# Patient Record
Sex: Male | Born: 1935 | Race: White | Hispanic: No | Marital: Married | Smoking: Never smoker
Health system: Southern US, Community
[De-identification: ages and names within clinical notes are randomized; demographics above are authoritative.]

## PROBLEM LIST (undated history)

## (undated) DIAGNOSIS — F0390 Unspecified dementia without behavioral disturbance: Secondary | ICD-10-CM

## (undated) HISTORY — PX: BACK SURGERY: SHX140

## (undated) HISTORY — PX: TOTAL HIP ARTHROPLASTY: SHX124

---

## 2005-11-25 ENCOUNTER — Emergency Department (HOSPITAL_COMMUNITY): Admission: EM | Admit: 2005-11-25 | Discharge: 2005-11-25 | Payer: Self-pay | Admitting: Family Medicine

## 2006-08-27 ENCOUNTER — Encounter: Admission: RE | Admit: 2006-08-27 | Discharge: 2006-08-27 | Payer: Self-pay | Admitting: Internal Medicine

## 2006-09-18 ENCOUNTER — Ambulatory Visit: Payer: Self-pay | Admitting: Gastroenterology

## 2006-10-02 ENCOUNTER — Ambulatory Visit: Payer: Self-pay | Admitting: Gastroenterology

## 2006-10-14 ENCOUNTER — Emergency Department (HOSPITAL_COMMUNITY): Admission: EM | Admit: 2006-10-14 | Discharge: 2006-10-14 | Payer: Self-pay | Admitting: Emergency Medicine

## 2011-07-31 ENCOUNTER — Encounter: Payer: Self-pay | Admitting: Gastroenterology

## 2011-12-23 ENCOUNTER — Emergency Department (HOSPITAL_COMMUNITY)
Admission: EM | Admit: 2011-12-23 | Discharge: 2011-12-23 | Discharge status: Against Medical Advice | Payer: Self-pay | Attending: Emergency Medicine | Admitting: Emergency Medicine

## 2011-12-23 ENCOUNTER — Encounter (HOSPITAL_COMMUNITY): Payer: Self-pay | Admitting: Physical Medicine and Rehabilitation

## 2011-12-23 ENCOUNTER — Emergency Department (INDEPENDENT_AMBULATORY_CARE_PROVIDER_SITE_OTHER): Payer: MEDICARE

## 2011-12-23 ENCOUNTER — Encounter (HOSPITAL_COMMUNITY): Payer: Self-pay

## 2011-12-23 ENCOUNTER — Emergency Department (INDEPENDENT_AMBULATORY_CARE_PROVIDER_SITE_OTHER)
Admission: EM | Admit: 2011-12-23 | Discharge: 2011-12-23 | Disposition: A | Discharge status: Other Acute Inpatient Hospital | Payer: MEDICARE | Source: Home / Self Care

## 2011-12-23 DIAGNOSIS — R109 Unspecified abdominal pain: Secondary | ICD-10-CM | POA: Insufficient documentation

## 2011-12-23 HISTORY — DX: Unspecified dementia, unspecified severity, without behavioral disturbance, psychotic disturbance, mood disturbance, and anxiety: F03.90

## 2011-12-23 LAB — POCT URINALYSIS DIP (DEVICE)
Ketones, ur: 40 mg/dL — AB
Protein, ur: 30 mg/dL — AB
Urobilinogen, UA: 1 mg/dL (ref 0.0–1.0)
pH: 6 (ref 5.0–8.0)

## 2011-12-23 NOTE — ED Notes (Addendum)
Patient left AMA; informed risks of leaving AMA.  Patient's family member states that they will go home and call to make an appointment with his primary care doctor in the morning.  Family member signed electronic AMA form.  Encouraged patient to stay; offered patient opportunity to speak with charge nurse and/or AD.  [facility] paperwork returned to patient, per request.

## 2011-12-23 NOTE — ED Notes (Signed)
Pt presents to department from [facility] for abdominal pain. Ongoing x4 days. Pt unable to answer all questions due to dementia. Family states belching, abdominal pain and constipation. X-ray at [facility] showed possible ileus. No nausea/vomiting. 5/10 pain at the time.

## 2011-12-23 NOTE — ED Notes (Signed)
Reported history of abdominal pain off and on for "quite a while"; known dementia , take aricept for dementia "when he wants to" family concerned about discomfort past few days, unsure of last BM ; have attempted to treat abdomnial pan at home w pepto bismol, and ducolax. Patient thinks he had diarrhea stool earlier today, but told EMT it has ben 3-4 days States he tried to call the office to make an appointment to see MD, but hung up on them when he went to voice mail

## 2011-12-23 NOTE — ED Provider Notes (Signed)
History     CSN: [id number]  Arrival date & time 12/23/11  1647   None     Chief Complaint  Patient presents with  . Abdominal Pain    (Consider location/radiation/quality/duration/timing/severity/associated sxs/prior treatment) Patient is a 78 y.o. male presenting with abdominal pain. The history is provided by the patient, the spouse and a relative.  Abdominal Pain The primary symptoms of the illness include abdominal pain.  Patient poor historian, history supplemented by wife and daughter.  Patient complains of midline abdominal pain that began 4 days ago.  No known aggravating or alleviating factors.  Denies nausea and vomiting, no fever noted, no change in urine, reports straining with defecation and decrease in amount of stool.  Stool noted to be hard and mix with 2 episodes of diarrhea in the last two days.  Wife reports poor appetite in the last week.      Past Medical History  Diagnosis Date  . Dementia     History reviewed. No pertinent past surgical history.  History reviewed. No pertinent family history.  History  Substance Use Topics  . Smoking status: Not on file  . Smokeless tobacco: Not on file  . Alcohol Use:       Review of Systems  Gastrointestinal: Positive for abdominal pain.  All other systems reviewed and are negative.    Allergies  Review of patient's allergies indicates not on file.  Home Medications   Current Outpatient Rx  Name Route Sig Dispense Refill  . DONEPEZIL HCL 10 MG PO TABS Oral Take 10 mg by mouth at bedtime as needed.      BP 154/73  Pulse 70  Temp 98.5 F (36.9 C) (Oral)  Resp 20  SpO2 96%  Physical Exam  Nursing note and vitals reviewed. Constitutional: He is oriented to person, place, and time. Vital signs are normal. He appears well-developed and well-nourished. He is active and cooperative.  HENT:  Head: Normocephalic.  Mouth/Throat: Uvula is midline, oropharynx is clear and moist and mucous membranes are  normal.  Eyes: Conjunctivae are normal. Pupils are equal, round, and reactive to light. No scleral icterus.  Neck: Trachea normal. Neck supple.  Cardiovascular: Normal rate, regular rhythm, normal heart sounds and normal pulses.   Pulmonary/Chest: Effort normal and breath sounds normal.  Abdominal: Soft. Normal appearance and normal aorta. He exhibits no distension, no ascites and no mass. Bowel sounds are increased. There is no hepatosplenomegaly. There is generalized tenderness. There is no CVA tenderness.  Neurological: He is alert and oriented to person, place, and time. No cranial nerve deficit or sensory deficit.  Skin: Skin is warm and dry.  Psychiatric: He has a normal mood and affect. His speech is normal and behavior is normal. Judgment and thought content normal. Cognition and memory are normal.    ED Course  Procedures (including critical care time)  Labs Reviewed  POCT URINALYSIS DIP (DEVICE) - Abnormal; Notable for the following:    Bilirubin Urine SMALL (*)     Ketones, ur 40 (*)     Protein, ur 30 (*)     All other components within normal limits   Dg Abd 1 View  12/23/2011  *RADIOLOGY REPORT*  Clinical Data: Chest pain  ABDOMEN - 1 VIEW  Comparison: None.  Findings: Diffuse distention of colon, small bowel, and the stomach is present.  No obvious free intraperitoneal gas.  Unremarkable soft tissues.  IMPRESSION: Diffuse gaseous distention as described.  Consider ileus.  Original Report Authenticated  By: [name], M.D.     1. Abdominal pain       MDM  Transfer to [facility] for further evaluation and treatment of abdominal pain.       Johnsie Kindred, NP 12/23/11 2030

## 2011-12-23 NOTE — ED Provider Notes (Signed)
Medical screening examination/treatment/procedure(s) were performed by non-physician practitioner and as supervising physician I was immediately available for consultation/collaboration.  Raynald Blend, MD 12/23/11 2049

## 2012-07-12 ENCOUNTER — Encounter: Payer: Self-pay | Admitting: Gastroenterology

## 2014-11-06 ENCOUNTER — Encounter (HOSPITAL_COMMUNITY): Payer: Self-pay

## 2014-11-06 ENCOUNTER — Emergency Department (HOSPITAL_COMMUNITY)
Admission: EM | Admit: 2014-11-06 | Discharge: 2014-11-07 | Disposition: A | Discharge status: Home / Self Care | Payer: Medicare Other | Attending: Emergency Medicine | Admitting: Emergency Medicine

## 2014-11-06 ENCOUNTER — Emergency Department (HOSPITAL_COMMUNITY): Payer: Medicare Other

## 2014-11-06 DIAGNOSIS — Z79899 Other long term (current) drug therapy: Secondary | ICD-10-CM | POA: Diagnosis not present

## 2014-11-06 DIAGNOSIS — F0390 Unspecified dementia without behavioral disturbance: Secondary | ICD-10-CM | POA: Diagnosis not present

## 2014-11-06 DIAGNOSIS — R110 Nausea: Secondary | ICD-10-CM | POA: Diagnosis not present

## 2014-11-06 DIAGNOSIS — E860 Dehydration: Secondary | ICD-10-CM | POA: Diagnosis present

## 2014-11-06 LAB — CBC WITH DIFFERENTIAL/PLATELET
Basophils Absolute: 0 10*3/uL (ref 0.0–0.1)
Basophils Relative: 0 % (ref 0–1)
EOS ABS: 0.1 10*3/uL (ref 0.0–0.7)
EOS PCT: 1 % (ref 0–5)
HEMATOCRIT: 32 % — AB (ref 39.0–52.0)
Hemoglobin: 10.4 g/dL — ABNORMAL LOW (ref 13.0–17.0)
LYMPHS ABS: 1.4 10*3/uL (ref 0.7–4.0)
LYMPHS PCT: 25 % (ref 12–46)
MCH: 32.3 pg (ref 26.0–34.0)
MCHC: 32.5 g/dL (ref 30.0–36.0)
MCV: 99.4 fL (ref 78.0–100.0)
Monocytes Absolute: 0.4 10*3/uL (ref 0.1–1.0)
Monocytes Relative: 7 % (ref 3–12)
NEUTROS ABS: 3.8 10*3/uL (ref 1.7–7.7)
Neutrophils Relative %: 67 % (ref 43–77)
PLATELETS: 254 10*3/uL (ref 150–400)
RBC: 3.22 MIL/uL — AB (ref 4.22–5.81)
RDW: 12.4 % (ref 11.5–15.5)
WBC: 5.7 10*3/uL (ref 4.0–10.5)

## 2014-11-06 LAB — COMPREHENSIVE METABOLIC PANEL
ALBUMIN: 3.9 g/dL (ref 3.5–5.0)
ALK PHOS: 47 U/L (ref 38–126)
ALT: 16 U/L — ABNORMAL LOW (ref 17–63)
ANION GAP: 10 (ref 5–15)
AST: 32 U/L (ref 15–41)
BUN: 23 mg/dL — AB (ref 6–20)
CALCIUM: 9.4 mg/dL (ref 8.9–10.3)
CO2: 24 mmol/L (ref 22–32)
Chloride: 104 mmol/L (ref 101–111)
Creatinine, Ser: 1.01 mg/dL (ref 0.61–1.24)
GFR calc Af Amer: >60 mL/min (ref >60)
GFR calc non Af Amer: >60 mL/min (ref >60)
GLUCOSE: 87 mg/dL (ref 65–99)
POTASSIUM: 3.9 mmol/L (ref 3.5–5.1)
SODIUM: 138 mmol/L (ref 135–145)
Total Bilirubin: 0.7 mg/dL (ref 0.3–1.2)
Total Protein: 6.8 g/dL (ref 6.5–8.1)

## 2014-11-06 LAB — TSH: TSH: 2.503 u[IU]/mL (ref 0.350–4.500)

## 2014-11-06 LAB — LIPASE, BLOOD: LIPASE: 43 U/L (ref 22–51)

## 2014-11-06 LAB — TROPONIN I

## 2014-11-06 MED ORDER — SODIUM CHLORIDE 0.9 % IV SOLN
Freq: Once | INTRAVENOUS | Status: AC
  Administered 2014-11-06: 22:00:00 via INTRAVENOUS

## 2014-11-06 MED ORDER — SODIUM CHLORIDE 0.9 % IV BOLUS (SEPSIS)
500.0000 mL | Freq: Once | INTRAVENOUS | Status: AC
  Administered 2014-11-06: 500 mL via INTRAVENOUS

## 2014-11-06 NOTE — ED Notes (Signed)
Daughter states that he has a hernia that he states hurts him sometime, he also states that he feels like things are too hot or too cold and he has trouble swallowing the food

## 2014-11-06 NOTE — ED Notes (Signed)
Patient in xray.  I will collect labs when he returned.

## 2014-11-06 NOTE — ED Notes (Signed)
Family is concerned because patient hasn't been eating or drinking for two days, pt has alzheimers, but family states that he was eating normally for him which isn't a lot for him anyways.

## 2014-11-06 NOTE — ED Notes (Signed)
Per pt's family - pt has refused to eat/drink for 2-3 days, pt had x1 episode of a small amount of diarrhea, pt's family states Saturday pt had complained about eating and stated his throat was hurting. Pt lying in bed, denies pain at present. Pt w/ hx of alzheimer and rt inguinal hernia.

## 2014-11-07 LAB — URINALYSIS, ROUTINE W REFLEX MICROSCOPIC
Bilirubin Urine: NEGATIVE
GLUCOSE, UA: NEGATIVE mg/dL
Ketones, ur: >80 mg/dL — AB
LEUKOCYTES UA: NEGATIVE
NITRITE: NEGATIVE
PH: 6 (ref 5.0–8.0)
Protein, ur: NEGATIVE mg/dL
SPECIFIC GRAVITY, URINE: 1.024 (ref 1.005–1.030)
Urobilinogen, UA: 1 mg/dL (ref 0.0–1.0)

## 2014-11-07 LAB — URINE MICROSCOPIC-ADD ON

## 2014-11-07 MED ORDER — OMEPRAZOLE 20 MG PO CPDR: 20.0000 mg | DELAYED_RELEASE_CAPSULE | Freq: Two times a day (BID) | ORAL | Status: DC

## 2014-11-07 NOTE — ED Provider Notes (Signed)
CSN: [id number]     Arrival date & time 11/06/14  1932 History   First MD Initiated Contact with Patient 11/06/14 2047     Chief Complaint  Patient presents with  . Dehydration     HPI  Family presents the patient for evaluation concerned that he may be dehydrated. He's had poor intake for the last 2-3 days and will complain at times that he hurts in his throat when he swallows. He takes Linzess. Family states he's had a little bit of diarrhea but "not much. No vomiting. Complains of discomfort in his throat when he swallows. He is urinating. He is normally incontinent and wears depends at night.  Past Medical History  Diagnosis Date  . Dementia    History reviewed. No pertinent past surgical history. History reviewed. No pertinent family history. History  Substance Use Topics  . Smoking status: Never Smoker   . Smokeless tobacco: Not on file  . Alcohol Use: No    Review of Systems  Unable to perform ROS: Dementia      Allergies  Review of patient's allergies indicates no known allergies.  Home Medications   Prior to Admission medications   Medication Sig Start Date End Date Taking? Authorizing Provider  LINZESS 145 MCG CAPS capsule Take 145 mcg by mouth daily. 11/02/14  Yes Historical Provider, MD  donepezil (ARICEPT) 10 MG tablet Take 10 mg by mouth at bedtime as needed.    Historical Provider, MD   BP 134/58 mmHg  Pulse 75  Temp(Src) 98.2 F (36.8 C) (Oral)  Resp 23  SpO2 100% Physical Exam  Constitutional: He appears well-developed and well-nourished. No distress.  HENT:  Head: Normocephalic.  Eyes: Conjunctivae are normal. Pupils are equal, round, and reactive to light. No scleral icterus.  Neck: Normal range of motion. Neck supple. No thyromegaly present.  Cardiovascular: Normal rate and regular rhythm.  Exam reveals no gallop and no friction rub.   No murmur heard. Pulmonary/Chest: Effort normal and breath sounds normal. No respiratory distress. He has no  wheezes. He has no rales.  Abdominal: Soft. Bowel sounds are normal. He exhibits no distension. There is no tenderness. There is no rebound.  Musculoskeletal: Normal range of motion.  Neurological: He is alert.  Skin: Skin is warm and dry. No rash noted.  Psychiatric: He has a normal mood and affect. His behavior is normal.    ED Course  Procedures (including critical care time) Labs Review Labs Reviewed  CBC WITH DIFFERENTIAL/PLATELET - Abnormal; Notable for the following:    RBC 3.22 (*)    Hemoglobin 10.4 (*)    HCT 32.0 (*)    All other components within normal limits  COMPREHENSIVE METABOLIC PANEL - Abnormal; Notable for the following:    BUN 23 (*)    ALT 16 (*)    All other components within normal limits  URINE CULTURE  LIPASE, BLOOD  TROPONIN I  TSH  URINALYSIS, ROUTINE W REFLEX MICROSCOPIC    Imaging Review Dg Abd Acute W/chest  11/06/2014   CLINICAL DATA:  Patient does not respond to questions, daughter states he has not eaten or had fluids for past three days, diarrhea for one week, seem to have abd pain  EXAM: DG ABDOMEN ACUTE W/ 1V CHEST  COMPARISON:  12/23/2011  FINDINGS: There is generalized increased bowel gas, but no bowel dilation is seen to suggest obstruction or significant adynamic ileus. There are no air-fluid levels on the decubitus view. There is no free air.  No significant soft tissue abnormality.  Lungs are hyperexpanded but clear. Heart, mediastinum and hila are unremarkable.  IMPRESSION: 1. No acute findings. No evidence of bowel obstruction, generalized adynamic ileus or free air. Mild generalized increased bowel gas. 2. No acute cardiopulmonary disease.   Electronically Signed   By: Amie Portlandavid  Ormond M.D.   On: 11/06/2014 22:43     EKG Interpretation None      MDM   Final diagnoses:  Nausea    Pt clinically not dehydrated. Moist lips and mouth. Not hypotensive or tachycardic. His labs are reassuring. Pharynx appears normal. See no thrush or  exudate. Her weight urine. I think he is appropriate for return home. We'll have him hold his Linzess, trial of H2 blocker and encouraging by mouth intake  .Marland Kitchen.atts  Rolland PorterMark Lina Hitch, MD 11/08/14 [phone]

## 2014-11-07 NOTE — Discharge Instructions (Signed)
Encourage food and fluids.  Hold his Linzess if he has more than one episode of diarrhea per day.  Start Prilosec prescription.  Check with his primary care physician if his appetite and intake do not improve.

## 2014-11-07 NOTE — ED Notes (Signed)
Pt ambulating independently w/ steady gait on d/c in no acute distress, A&Ox4. D/c instructions reviewed w/ pt and family - pt and family deny any further questions or concerns at present. Rx given x1  

## 2014-11-08 LAB — URINE CULTURE
COLONY COUNT: NO GROWTH
CULTURE: NO GROWTH

## 2015-03-21 ENCOUNTER — Emergency Department (HOSPITAL_COMMUNITY): Payer: Medicare Other

## 2015-03-21 ENCOUNTER — Inpatient Hospital Stay (HOSPITAL_COMMUNITY)
Admission: EM | Admit: 2015-03-21 | Discharge: 2015-03-26 | DRG: 469 | Disposition: A | Discharge status: Skilled Nursing Facility | Payer: Medicare Other | Attending: Internal Medicine | Admitting: Internal Medicine

## 2015-03-21 ENCOUNTER — Encounter (HOSPITAL_COMMUNITY): Payer: Self-pay

## 2015-03-21 DIAGNOSIS — F0390 Unspecified dementia without behavioral disturbance: Secondary | ICD-10-CM | POA: Diagnosis present

## 2015-03-21 DIAGNOSIS — R0902 Hypoxemia: Secondary | ICD-10-CM | POA: Diagnosis not present

## 2015-03-21 DIAGNOSIS — F0391 Unspecified dementia with behavioral disturbance: Secondary | ICD-10-CM | POA: Diagnosis present

## 2015-03-21 DIAGNOSIS — D539 Nutritional anemia, unspecified: Secondary | ICD-10-CM | POA: Diagnosis present

## 2015-03-21 DIAGNOSIS — M25551 Pain in right hip: Secondary | ICD-10-CM | POA: Diagnosis not present

## 2015-03-21 DIAGNOSIS — S72009A Fracture of unspecified part of neck of unspecified femur, initial encounter for closed fracture: Secondary | ICD-10-CM | POA: Diagnosis present

## 2015-03-21 DIAGNOSIS — D72829 Elevated white blood cell count, unspecified: Secondary | ICD-10-CM | POA: Diagnosis present

## 2015-03-21 DIAGNOSIS — R600 Localized edema: Secondary | ICD-10-CM | POA: Diagnosis present

## 2015-03-21 DIAGNOSIS — D649 Anemia, unspecified: Secondary | ICD-10-CM | POA: Diagnosis present

## 2015-03-21 DIAGNOSIS — S72011A Unspecified intracapsular fracture of right femur, initial encounter for closed fracture: Secondary | ICD-10-CM | POA: Diagnosis not present

## 2015-03-21 DIAGNOSIS — W06XXXA Fall from bed, initial encounter: Secondary | ICD-10-CM | POA: Diagnosis present

## 2015-03-21 DIAGNOSIS — T501X5A Adverse effect of loop [high-ceiling] diuretics, initial encounter: Secondary | ICD-10-CM | POA: Diagnosis present

## 2015-03-21 DIAGNOSIS — Y92013 Bedroom of single-family (private) house as the place of occurrence of the external cause: Secondary | ICD-10-CM

## 2015-03-21 DIAGNOSIS — S72001A Fracture of unspecified part of neck of right femur, initial encounter for closed fracture: Secondary | ICD-10-CM | POA: Diagnosis present

## 2015-03-21 DIAGNOSIS — Z09 Encounter for follow-up examination after completed treatment for conditions other than malignant neoplasm: Secondary | ICD-10-CM

## 2015-03-21 DIAGNOSIS — E860 Dehydration: Secondary | ICD-10-CM | POA: Diagnosis present

## 2015-03-21 DIAGNOSIS — Z79899 Other long term (current) drug therapy: Secondary | ICD-10-CM

## 2015-03-21 DIAGNOSIS — T148XXA Other injury of unspecified body region, initial encounter: Secondary | ICD-10-CM

## 2015-03-21 DIAGNOSIS — N179 Acute kidney failure, unspecified: Secondary | ICD-10-CM | POA: Diagnosis present

## 2015-03-21 DIAGNOSIS — K469 Unspecified abdominal hernia without obstruction or gangrene: Secondary | ICD-10-CM | POA: Diagnosis present

## 2015-03-21 DIAGNOSIS — F03918 Unspecified dementia, unspecified severity, with other behavioral disturbance: Secondary | ICD-10-CM | POA: Diagnosis present

## 2015-03-21 DIAGNOSIS — J181 Lobar pneumonia, unspecified organism: Secondary | ICD-10-CM | POA: Diagnosis not present

## 2015-03-21 DIAGNOSIS — W19XXXA Unspecified fall, initial encounter: Secondary | ICD-10-CM | POA: Diagnosis present

## 2015-03-21 LAB — URINALYSIS, ROUTINE W REFLEX MICROSCOPIC
BILIRUBIN URINE: NEGATIVE
Glucose, UA: NEGATIVE mg/dL
HGB URINE DIPSTICK: NEGATIVE
Ketones, ur: NEGATIVE mg/dL
Leukocytes, UA: NEGATIVE
NITRITE: NEGATIVE
PROTEIN: NEGATIVE mg/dL
SPECIFIC GRAVITY, URINE: 1.02 (ref 1.005–1.030)
UROBILINOGEN UA: 1 mg/dL (ref 0.0–1.0)
pH: 7 (ref 5.0–8.0)

## 2015-03-21 LAB — CBC WITH DIFFERENTIAL/PLATELET
BASOS ABS: 0 10*3/uL (ref 0.0–0.1)
BASOS PCT: 0 %
EOS ABS: 0.2 10*3/uL (ref 0.0–0.7)
Eosinophils Relative: 2 %
HEMATOCRIT: 32.1 % — AB (ref 39.0–52.0)
HEMOGLOBIN: 10 g/dL — AB (ref 13.0–17.0)
Lymphocytes Relative: 14 %
Lymphs Abs: 1.3 10*3/uL (ref 0.7–4.0)
MCH: 32.8 pg (ref 26.0–34.0)
MCHC: 31.2 g/dL (ref 30.0–36.0)
MCV: 105.2 fL — ABNORMAL HIGH (ref 78.0–100.0)
Monocytes Absolute: 0.5 10*3/uL (ref 0.1–1.0)
Monocytes Relative: 6 %
NEUTROS ABS: 7.6 10*3/uL (ref 1.7–7.7)
NEUTROS PCT: 78 %
Platelets: 232 10*3/uL (ref 150–400)
RBC: 3.05 MIL/uL — ABNORMAL LOW (ref 4.22–5.81)
RDW: 13.2 % (ref 11.5–15.5)
WBC: 9.7 10*3/uL (ref 4.0–10.5)

## 2015-03-21 LAB — BASIC METABOLIC PANEL
ANION GAP: 1 — AB (ref 5–15)
BUN: 29 mg/dL — ABNORMAL HIGH (ref 6–20)
CALCIUM: 8.6 mg/dL — AB (ref 8.9–10.3)
CHLORIDE: 106 mmol/L (ref 101–111)
CO2: 34 mmol/L — AB (ref 22–32)
CREATININE: 1.15 mg/dL (ref 0.61–1.24)
GFR calc non Af Amer: 55 mL/min — ABNORMAL LOW (ref >60)
Glucose, Bld: 99 mg/dL (ref 65–99)
Potassium: 3.7 mmol/L (ref 3.5–5.1)
SODIUM: 141 mmol/L (ref 135–145)

## 2015-03-21 LAB — TYPE AND SCREEN
ABO/RH(D): O POS
Antibody Screen: NEGATIVE

## 2015-03-21 LAB — PROTIME-INR
INR: 1.08 (ref 0.00–1.49)
PROTHROMBIN TIME: 14.2 s (ref 11.6–15.2)

## 2015-03-21 MED ORDER — FENTANYL CITRATE (PF) 100 MCG/2ML IJ SOLN
50.0000 ug | INTRAMUSCULAR | Status: DC | PRN
  Filled 2015-03-21: qty 2

## 2015-03-21 NOTE — ED Notes (Signed)
Pt had an unwitnessed fall from home, when EMS arrived pt was in the bed, he complains of right thigh pain when applying pressure to that leg, no other complaints

## 2015-03-21 NOTE — ED Provider Notes (Signed)
CSN: [id number]     Arrival date & time 03/21/15  2028 History   First MD Initiated Contact with Patient 03/21/15 2050     Chief Complaint  Patient presents with  . Fall     (Consider location/radiation/quality/duration/timing/severity/associated sxs/prior Treatment) Patient is a 81 y.o. male presenting with leg pain. The history is provided by a relative.  Leg Pain Location:  Hip Injury: yes   Mechanism of injury: fall   Fall:    Fall occurred: from bed.   Impact surface:  [employer] of impact:  Unable to specify   Entrapped after fall: no   Hip location:  R hip Pain details:    Quality:  Aching   Radiates to:  Does not radiate   Severity:  Moderate   Duration:  1 day   Timing:  Constant   Progression:  Unchanged Chronicity:  New Prior injury to area:  No Relieved by:  Nothing Worsened by:  Nothing tried Ineffective treatments:  None tried Associated symptoms: no fever     Past Medical History  Diagnosis Date  . Dementia    Past Surgical History  Procedure Laterality Date  . Back surgery     Family History  Problem Relation Age of Onset  . Dementia Other    Social History  Substance Use Topics  . Smoking status: Never Smoker   . Smokeless tobacco: None  . Alcohol Use: No    Review of Systems  Unable to perform ROS: Dementia  Constitutional: Negative for fever.      Allergies  Review of patient's allergies indicates no known allergies.  Home Medications   Prior to Admission medications   Medication Sig Start Date End Date Taking? Authorizing Provider  furosemide (LASIX) 20 MG tablet TAKE 1 TABLET BY MOUTH ONCE DAILY 02/25/15  Yes Historical Provider, MD  omeprazole (PRILOSEC) 20 MG capsule Take 1 capsule (20 mg total) by mouth 2 (two) times daily. Patient not taking: Reported on 03/21/2015 11/07/14   Rolland Porter, MD   BP 133/58 mmHg  Pulse 81  Temp(Src) 99 F (37.2 C) (Axillary)  Resp 16  Ht  (1.727 m)  Wt 131 lb 2.8 oz (59.5 kg)   BMI 19.95 kg/m2  SpO2 99% Physical Exam  Constitutional: He appears well-developed and well-nourished. No distress.  HENT:  Head: Normocephalic and atraumatic. Head is without contusion.  Eyes: Conjunctivae are normal.  Neck: Neck supple. No tracheal deviation present.  Cardiovascular: Normal rate, regular rhythm and normal heart sounds.   Pulmonary/Chest: Effort normal and breath sounds normal. No respiratory distress.  Abdominal: Soft. He exhibits no distension.  Musculoskeletal:  Pain with movement of hip, tenderness over right knee  Neurological: He is alert. He is disoriented.  Skin: Skin is warm and dry.  Psychiatric: He has a normal mood and affect.    ED Course  Procedures (including critical care time) Labs Review Labs Reviewed  CBC WITH DIFFERENTIAL/PLATELET - Abnormal; Notable for the following:    RBC 3.05 (*)    Hemoglobin 10.0 (*)    HCT 32.1 (*)    MCV 105.2 (*)    All other components within normal limits  BASIC METABOLIC PANEL - Abnormal; Notable for the following:    CO2 34 (*)    BUN 29 (*)    Calcium 8.6 (*)    GFR calc non Af Amer 55 (*)    Anion gap 1 (*)    All other components within normal limits  URINALYSIS, ROUTINE W REFLEX MICROSCOPIC (NOT AT [facility])  PROTIME-INR  I-STAT CG4 LACTIC ACID, ED  TYPE AND SCREEN  ABO/RH    Imaging Review Dg Chest 1 View  03/21/2015   CLINICAL DATA:  Unwitnessed fall.  EXAM: CHEST 1 VIEW  COMPARISON:  11/06/2014  FINDINGS: The heart size and mediastinal contours are within normal limits. Both lungs are clear. The visualized skeletal structures are unremarkable.  IMPRESSION: No active disease.   Electronically Signed   By: Ellery Plunk M.D.   On: 03/21/2015 21:49   Dg Pelvis 1-2 Views  03/21/2015   CLINICAL DATA:  Status post fall. Concern for pelvic injury. Right thigh pain. Initial encounter.  EXAM: PELVIS - 1-2 VIEW  COMPARISON:  Abdominal radiographs performed 11/06/2014 and 12/23/2011  FINDINGS: There is new  cortical irregularity at the right femoral neck, raising suspicion for a subcapital fracture of the right femoral neck. The right femoral head remains seated at the acetabulum.  Mild sclerotic change is noted at the sacroiliac joints. The left hip joint is unremarkable in appearance, with a small osseous density lateral to the joint space likely reflecting a small loose body. The visualized bowel gas pattern is grossly unremarkable.  IMPRESSION: New cortical irregularity of the right femoral neck, raising suspicion for a subcapital fracture of the right femoral neck.   Electronically Signed   By: Roanna Raider M.D.   On: 03/21/2015 21:49   Dg Knee Complete 4 Views Right  03/21/2015   CLINICAL DATA:  Unwitnessed fall at home now with right knee and thigh pain.  EXAM: RIGHT KNEE - COMPLETE 4+ VIEW  COMPARISON:  Right femur radiographs - earlier same day  FINDINGS: The lateral radiograph is degraded due to obliquity.  Osteopenia without definite displaced fracture. Mild degenerative change of the knee, most conspicuous within the medial compartment with joint space loss, subchondral sclerosis and osteophytosis. No evidence of chondrocalcinosis. No joint effusion or evidence of lipohemarthrosis. Adjacent vascular calcifications. No radiopaque foreign body.  IMPRESSION: Osteopenia without fracture or dislocation.   Electronically Signed   By: Simonne Come M.D.   On: 03/21/2015 21:49   Dg Femur, Min 2 Views Right  03/21/2015   CLINICAL DATA:  Fall  EXAM: RIGHT FEMUR 2 VIEWS  COMPARISON:  None.  FINDINGS: Subcapital femoral neck fracture with impaction is present. Osteopenia. Vascular calcifications are noted. No dislocation.  IMPRESSION: Acute subcapital right femoral neck fracture with impaction. Osteopenia.   Electronically Signed   By: Jolaine Click M.D.   On: 03/21/2015 21:49   I have personally reviewed and evaluated these images and lab results as part of my medical decision-making.   EKG  Interpretation None      MDM   Final diagnoses:  Hip fracture, right, closed, initial encounter [facility])   81 y.o. male presents with fall from bed and right leg pain. Has large, firm, reducible hernia and pain refractory on leg following reduction. Plain films concerning for fracture. Orthopedics consulted and recommended medical admission, will see Pt in the morning. Hospitalist was consulted for admission and will see the patient in the emergency department.     Lyndal Pulley, MD 03/22/15 0111

## 2015-03-21 NOTE — ED Notes (Signed)
Admitting Dr in for evaluation 

## 2015-03-21 NOTE — ED Notes (Signed)
Bed: ZO10 Expected date:  Expected time:  Means of arrival:  Comments: EMS/74M/unwitnessed fall/R thigh pain

## 2015-03-21 NOTE — ED Notes (Signed)
Unable to collect labs at this time patient going to xray 

## 2015-03-22 ENCOUNTER — Inpatient Hospital Stay (HOSPITAL_COMMUNITY): Payer: Medicare Other

## 2015-03-22 ENCOUNTER — Encounter (HOSPITAL_COMMUNITY): Payer: Self-pay | Admitting: Internal Medicine

## 2015-03-22 DIAGNOSIS — K469 Unspecified abdominal hernia without obstruction or gangrene: Secondary | ICD-10-CM | POA: Diagnosis present

## 2015-03-22 DIAGNOSIS — R600 Localized edema: Secondary | ICD-10-CM | POA: Diagnosis present

## 2015-03-22 DIAGNOSIS — S72001D Fracture of unspecified part of neck of right femur, subsequent encounter for closed fracture with routine healing: Secondary | ICD-10-CM | POA: Diagnosis not present

## 2015-03-22 DIAGNOSIS — R0902 Hypoxemia: Secondary | ICD-10-CM | POA: Diagnosis not present

## 2015-03-22 DIAGNOSIS — F03918 Unspecified dementia, unspecified severity, with other behavioral disturbance: Secondary | ICD-10-CM | POA: Diagnosis present

## 2015-03-22 DIAGNOSIS — Z79899 Other long term (current) drug therapy: Secondary | ICD-10-CM | POA: Diagnosis not present

## 2015-03-22 DIAGNOSIS — F0390 Unspecified dementia without behavioral disturbance: Secondary | ICD-10-CM | POA: Diagnosis not present

## 2015-03-22 DIAGNOSIS — F0391 Unspecified dementia with behavioral disturbance: Secondary | ICD-10-CM | POA: Diagnosis present

## 2015-03-22 DIAGNOSIS — J189 Pneumonia, unspecified organism: Secondary | ICD-10-CM | POA: Diagnosis not present

## 2015-03-22 DIAGNOSIS — S72001S Fracture of unspecified part of neck of right femur, sequela: Secondary | ICD-10-CM | POA: Diagnosis not present

## 2015-03-22 DIAGNOSIS — S72001A Fracture of unspecified part of neck of right femur, initial encounter for closed fracture: Secondary | ICD-10-CM | POA: Diagnosis not present

## 2015-03-22 DIAGNOSIS — D649 Anemia, unspecified: Secondary | ICD-10-CM | POA: Diagnosis not present

## 2015-03-22 DIAGNOSIS — S72009A Fracture of unspecified part of neck of unspecified femur, initial encounter for closed fracture: Secondary | ICD-10-CM | POA: Diagnosis present

## 2015-03-22 DIAGNOSIS — S72011A Unspecified intracapsular fracture of right femur, initial encounter for closed fracture: Secondary | ICD-10-CM | POA: Diagnosis present

## 2015-03-22 DIAGNOSIS — W19XXXS Unspecified fall, sequela: Secondary | ICD-10-CM | POA: Diagnosis not present

## 2015-03-22 DIAGNOSIS — T501X5A Adverse effect of loop [high-ceiling] diuretics, initial encounter: Secondary | ICD-10-CM | POA: Diagnosis present

## 2015-03-22 DIAGNOSIS — M25551 Pain in right hip: Secondary | ICD-10-CM | POA: Diagnosis present

## 2015-03-22 DIAGNOSIS — W06XXXA Fall from bed, initial encounter: Secondary | ICD-10-CM | POA: Diagnosis present

## 2015-03-22 DIAGNOSIS — Y92013 Bedroom of single-family (private) house as the place of occurrence of the external cause: Secondary | ICD-10-CM | POA: Diagnosis not present

## 2015-03-22 DIAGNOSIS — E860 Dehydration: Secondary | ICD-10-CM | POA: Diagnosis present

## 2015-03-22 DIAGNOSIS — N179 Acute kidney failure, unspecified: Secondary | ICD-10-CM | POA: Diagnosis present

## 2015-03-22 DIAGNOSIS — J181 Lobar pneumonia, unspecified organism: Secondary | ICD-10-CM | POA: Diagnosis not present

## 2015-03-22 DIAGNOSIS — D539 Nutritional anemia, unspecified: Secondary | ICD-10-CM | POA: Diagnosis present

## 2015-03-22 LAB — CBC WITH DIFFERENTIAL/PLATELET
BASOS PCT: 0 %
Basophils Absolute: 0 10*3/uL (ref 0.0–0.1)
EOS ABS: 0.1 10*3/uL (ref 0.0–0.7)
EOS PCT: 1 %
HCT: 30.8 % — ABNORMAL LOW (ref 39.0–52.0)
Hemoglobin: 9.8 g/dL — ABNORMAL LOW (ref 13.0–17.0)
LYMPHS ABS: 0.8 10*3/uL (ref 0.7–4.0)
Lymphocytes Relative: 7 %
MCH: 33.1 pg (ref 26.0–34.0)
MCHC: 31.8 g/dL (ref 30.0–36.0)
MCV: 104.1 fL — ABNORMAL HIGH (ref 78.0–100.0)
MONO ABS: 0.8 10*3/uL (ref 0.1–1.0)
MONOS PCT: 7 %
NEUTROS PCT: 85 %
Neutro Abs: 9.7 10*3/uL — ABNORMAL HIGH (ref 1.7–7.7)
Platelets: 199 10*3/uL (ref 150–400)
RBC: 2.96 MIL/uL — ABNORMAL LOW (ref 4.22–5.81)
RDW: 13.1 % (ref 11.5–15.5)
WBC: 11.4 10*3/uL — ABNORMAL HIGH (ref 4.0–10.5)

## 2015-03-22 LAB — COMPREHENSIVE METABOLIC PANEL
ALK PHOS: 45 U/L (ref 38–126)
ALT: 16 U/L — AB (ref 17–63)
AST: 27 U/L (ref 15–41)
Albumin: 3.3 g/dL — ABNORMAL LOW (ref 3.5–5.0)
Anion gap: 5 (ref 5–15)
BUN: 27 mg/dL — AB (ref 6–20)
CALCIUM: 8.6 mg/dL — AB (ref 8.9–10.3)
CHLORIDE: 105 mmol/L (ref 101–111)
CO2: 30 mmol/L (ref 22–32)
CREATININE: 1.03 mg/dL (ref 0.61–1.24)
GFR calc non Af Amer: >60 mL/min (ref >60)
GLUCOSE: 115 mg/dL — AB (ref 65–99)
Potassium: 4 mmol/L (ref 3.5–5.1)
SODIUM: 140 mmol/L (ref 135–145)
Total Bilirubin: 0.9 mg/dL (ref 0.3–1.2)
Total Protein: 5.8 g/dL — ABNORMAL LOW (ref 6.5–8.1)

## 2015-03-22 LAB — SURGICAL PCR SCREEN
MRSA, PCR: NEGATIVE
STAPHYLOCOCCUS AUREUS: NEGATIVE

## 2015-03-22 LAB — ABO/RH: ABO/RH(D): O POS

## 2015-03-22 MED ORDER — LORAZEPAM 2 MG/ML IJ SOLN
0.5000 mg | Freq: Once | INTRAMUSCULAR | Status: DC
  Filled 2015-03-22: qty 1

## 2015-03-22 MED ORDER — INFLUENZA VAC SPLIT QUAD 0.5 ML IM SUSY: 0.5000 mL | PREFILLED_SYRINGE | INTRAMUSCULAR | Status: DC | PRN

## 2015-03-22 MED ORDER — FUROSEMIDE 20 MG PO TABS
20.0000 mg | ORAL_TABLET | Freq: Every day | ORAL | Status: DC
  Administered 2015-03-23 – 2015-03-25 (×3): 20 mg via ORAL
  Filled 2015-03-22 (×3): qty 1

## 2015-03-22 MED ORDER — METHOCARBAMOL 1000 MG/10ML IJ SOLN
500.0000 mg | Freq: Three times a day (TID) | INTRAVENOUS | Status: DC
  Administered 2015-03-23 (×2): 500 mg via INTRAVENOUS
  Filled 2015-03-22 (×5): qty 5

## 2015-03-22 MED ORDER — LORAZEPAM 2 MG/ML IJ SOLN
0.5000 mg | Freq: Once | INTRAMUSCULAR | Status: AC
  Administered 2015-03-22: 0.5 mg via INTRAVENOUS

## 2015-03-22 MED ORDER — DEXTROSE 5 % IV SOLN
1.0000 g | INTRAVENOUS | Status: DC
  Administered 2015-03-22 – 2015-03-25 (×4): 1 g via INTRAVENOUS
  Filled 2015-03-22 (×6): qty 10

## 2015-03-22 MED ORDER — HYDROCODONE-ACETAMINOPHEN 5-325 MG PO TABS: 1.0000 | ORAL_TABLET | Freq: Four times a day (QID) | ORAL | Status: DC | PRN

## 2015-03-22 MED ORDER — TRANEXAMIC ACID 1000 MG/10ML IV SOLN
1000.0000 mg | INTRAVENOUS | Status: AC
  Filled 2015-03-22: qty 10

## 2015-03-22 MED ORDER — CEFAZOLIN SODIUM-DEXTROSE 2-3 GM-% IV SOLR
2.0000 g | INTRAVENOUS | Status: AC
  Administered 2015-03-23: 2 g via INTRAVENOUS
  Filled 2015-03-22: qty 50

## 2015-03-22 MED ORDER — INFLUENZA VAC SPLIT QUAD 0.5 ML IM SUSY
0.5000 mL | PREFILLED_SYRINGE | INTRAMUSCULAR | Status: DC | PRN
  Filled 2015-03-22: qty 0.5

## 2015-03-22 MED ORDER — DEXTROSE 5 % IV SOLN
500.0000 mg | INTRAVENOUS | Status: DC
  Administered 2015-03-22 – 2015-03-23 (×2): 500 mg via INTRAVENOUS
  Filled 2015-03-22 (×2): qty 500

## 2015-03-22 MED ORDER — SODIUM CHLORIDE 0.9 % IV SOLN
INTRAVENOUS | Status: AC
  Administered 2015-03-22 – 2015-03-23 (×2): via INTRAVENOUS

## 2015-03-22 MED ORDER — MORPHINE SULFATE (PF) 2 MG/ML IV SOLN
0.5000 mg | INTRAVENOUS | Status: DC | PRN
  Administered 2015-03-22 – 2015-03-24 (×8): 0.5 mg via INTRAVENOUS
  Filled 2015-03-22 (×9): qty 1

## 2015-03-22 NOTE — H&P (Addendum)
[facility] History and Physical  Wayne Schroeder:[id number] DOB: 01-28-1925 DOA: 03/21/2015  Referring physician: Dr. Clydene Pugh. PCP: Martha Clan, MD  Specialists: None.  Chief Complaint: Fall.  HPI: Wayne Schroeder is a 81 y.o. male with history of dementia and lower extremity edema on Lasix was brought to the ER after patient had a fall at his house. Patient's fall was witnessed by patient's wife. As per patient's daughter who provided history patient did not hit his head or lose consciousness and has not complained of any chest pain or shortness of breath. In the ER x-rays reveal a right hip fracture and on-call orthopedic surgeon Dr. Victorino Dike was consulted and patient admitted for further management. On exam patient is mildly sedated. Patient also had abdominal hernia which was reduced by the ER physician. As per patient's daughter patient is used to ambulatory without help and is able to dress himself up after prompting.  Review of Systems: As presented in the history of presenting illness, rest negative.  Past Medical History  Diagnosis Date  . Dementia    Past Surgical History  Procedure Laterality Date  . Back surgery     Social History:  reports that he has never smoked. He does not have any smokeless tobacco history on file. He reports that he does not drink alcohol or use illicit drugs. Where does patient live home. Can patient participate in ADLs? No.  No Known Allergies  Family History:  Family History  Problem Relation Age of Onset  . Dementia Other       Prior to Admission medications   Medication Sig Start Date End Date Taking? Authorizing Provider  furosemide (LASIX) 20 MG tablet TAKE 1 TABLET BY MOUTH ONCE DAILY 02/25/15  Yes Historical Provider, MD  omeprazole (PRILOSEC) 20 MG capsule Take 1 capsule (20 mg total) by mouth 2 (two) times daily. Patient not taking: Reported on 03/21/2015 11/07/14   Rolland Porter, MD    Physical Exam: Filed Vitals:    03/21/15 2039 03/21/15 2347 03/22/15 0000  BP: 137/77 133/58   Pulse: 91 81   Temp: 97.5 F (36.4 C) 99 F (37.2 C)   TempSrc: Oral Axillary   Resp: 18 16   Height:    (1.727 m)  Weight:   59.5 kg (131 lb 2.8 oz)  SpO2: 97% 99%      General:  Moderately built and nourished.  Eyes: Anicteric no pallor.  ENT: No discharge from the ears eyes nose or mouth.  Neck: No mass felt.  Cardiovascular: S1-S2 heard.  Respiratory: No rhonchi or crepitations.  Abdomen: Abdominal hernia reduced. Soft nontender.  Skin: No rash.  Musculoskeletal: Pain on moving her right hip.  Psychiatric: Patient is mildly sedated.  Neurologic: Mildly sedated. Responds to his name.  Labs on Admission:  Basic Metabolic Panel:  Recent Labs Lab 03/21/15 2207  NA 141  K 3.7  CL 106  CO2 34*  GLUCOSE 99  BUN 29*  CREATININE 1.15  CALCIUM 8.6*   Liver Function Tests: No results for input(s): AST, ALT, ALKPHOS, BILITOT, PROT, ALBUMIN in the last 168 hours. No results for input(s): LIPASE, AMYLASE in the last 168 hours. No results for input(s): AMMONIA in the last 168 hours. CBC:  Recent Labs Lab 03/21/15 2207  WBC 9.7  NEUTROABS 7.6  HGB 10.0*  HCT 32.1*  MCV 105.2*  PLT 232   Cardiac Enzymes: No results for input(s): CKTOTAL, CKMB, CKMBINDEX, TROPONINI in the last 168 hours.  BNP (  last 3 results) No results for input(s): BNP in the last 8760 hours.  ProBNP (last 3 results) No results for input(s): PROBNP in the last 8760 hours.  CBG: No results for input(s): GLUCAP in the last 168 hours.  Radiological Exams on Admission: Dg Chest 1 View  03/21/2015   CLINICAL DATA:  Unwitnessed fall.  EXAM: CHEST 1 VIEW  COMPARISON:  11/06/2014  FINDINGS: The heart size and mediastinal contours are within normal limits. Both lungs are clear. The visualized skeletal structures are unremarkable.  IMPRESSION: No active disease.   Electronically Signed   By: Ellery Plunk M.D.   On:  03/21/2015 21:49   Dg Pelvis 1-2 Views  03/21/2015   CLINICAL DATA:  Status post fall. Concern for pelvic injury. Right thigh pain. Initial encounter.  EXAM: PELVIS - 1-2 VIEW  COMPARISON:  Abdominal radiographs performed 11/06/2014 and 12/23/2011  FINDINGS: There is new cortical irregularity at the right femoral neck, raising suspicion for a subcapital fracture of the right femoral neck. The right femoral head remains seated at the acetabulum.  Mild sclerotic change is noted at the sacroiliac joints. The left hip joint is unremarkable in appearance, with a small osseous density lateral to the joint space likely reflecting a small loose body. The visualized bowel gas pattern is grossly unremarkable.  IMPRESSION: New cortical irregularity of the right femoral neck, raising suspicion for a subcapital fracture of the right femoral neck.   Electronically Signed   By: Roanna Raider M.D.   On: 03/21/2015 21:49   Dg Knee Complete 4 Views Right  03/21/2015   CLINICAL DATA:  Unwitnessed fall at home now with right knee and thigh pain.  EXAM: RIGHT KNEE - COMPLETE 4+ VIEW  COMPARISON:  Right femur radiographs - earlier same day  FINDINGS: The lateral radiograph is degraded due to obliquity.  Osteopenia without definite displaced fracture. Mild degenerative change of the knee, most conspicuous within the medial compartment with joint space loss, subchondral sclerosis and osteophytosis. No evidence of chondrocalcinosis. No joint effusion or evidence of lipohemarthrosis. Adjacent vascular calcifications. No radiopaque foreign body.  IMPRESSION: Osteopenia without fracture or dislocation.   Electronically Signed   By: Simonne Come M.D.   On: 03/21/2015 21:49   Dg Femur, Min 2 Views Right  03/21/2015   CLINICAL DATA:  Fall  EXAM: RIGHT FEMUR 2 VIEWS  COMPARISON:  None.  FINDINGS: Subcapital femoral neck fracture with impaction is present. Osteopenia. Vascular calcifications are noted. No dislocation.  IMPRESSION: Acute  subcapital right femoral neck fracture with impaction. Osteopenia.   Electronically Signed   By: Jolaine Click M.D.   On: 03/21/2015 21:49    Assessment/Plan Active Problems:   Closed right hip fracture (HCC)   Dementia   Chronic anemia   Hip fracture (HCC)   Hip fracture, right (HCC)   1. Right hip fracture status post mechanical fall - I have discussed with Dr. Victorino Dike on call orthopedic surgeon. Plan is to have surgery later in the day for which patient be kept nothing by mouth from morning. Continue with pain relief medications. Hold Lasix for now. Patient given his age is high risk for intermediate risk procedure. 2. Chronic anemia with macrocytosis - follow CBC and further workup as outpatient. Expecting blood loss from surgery we will type and screen and hold. 3. Dementia with no behavioral disturbances - no acute issues at this time. 4. Abdominal hernia - presently reduced in the ER. Closely observe. 5. History of lower extremity edema  on Lasix - restart Lasix after surgery when hemodynamically stable.  Patient's EKG is pending. I have discussed with on-call orthopedic surgeon. I have reviewed patient's chest x-ray. Patient's CODE STATUS is full code at this time but patient's daughter is going to review patient's living will and let us know in a.m.  DVT Prophylaxis SCDs.  Code Status: Full code. This will be reconfirmed by patient's daughter in a.m. Family Communication: Patient's daughter.  Disposition Plan: Admit to inpatient.    Omid Deardorff N. [facility] Pager [phone].  If 7PM-7AM, please contact night-coverage [email or web address] Password [email or web address] 03/22/2015, 1:03 AM

## 2015-03-22 NOTE — Progress Notes (Addendum)
[facility] PROGRESS NOTE  Wayne Schroeder YQI:[id number] DOB: Aug 25, 1924 DOA: 03/21/2015 PCP: Martha Clan, MD  Assessment/Plan: Displaced Right hip fracture S/p mecahnical fall. Ortho consulted. Pain cotnrol with prn percocet and low dose morphine. Will add robaxin post op. EKG normal. No cardiac history. Patient is at  Mild to moderate perioperative risk for surgery. No further cardiac testing including perioperative BB recommended. Prn low dose ativan for agitation. possibly needs SNF post op  Community acquired pneumonia Patient hypoxic to 80s this am. CXR repeated shows RLL infiltrate. Placed on empiric rocephin and azithromycin. Needs swallow evaluation post op  Severe dementia  Chronic macrocytic anemia Monitor h&H  abdominal hernia  reducible  LE edema  resume lasix post op  DVT prophylaxis  Diet: NPO  Code Status: full Family Communication: wife and son in law at bedside Disposition Plan: may need SNF post op   Consultants: Ortho  Procedures:  For OR possibly today  Antibiotics:  none  HPI/Subjective: Admission H&P reviewed. Pt very restless.  Objective: Filed Vitals:   03/22/15 1415  BP: 118/65  Pulse: 64  Temp: 98 F (36.7 C)  Resp: 15    Intake/Output Summary (Last 24 hours) at 03/22/15 1436 Last data filed at 03/22/15 1417  Gross per 24 hour  Intake      0 ml  Output    650 ml  Net   -650 ml   Filed Weights   03/22/15 0000  Weight: 59.5 kg (131 lb 2.8 oz)    Exam:   General: elderly thin built male,.confused and restless  HEENT: conjunctival congestion, moist mucosa  Cardiovascular: NS1&S2, No murmurs  Respiratory: clear b/l  Abdomen: soft, NT, ND, BS+  Musculoskeletal: warm, limited mobility of rt hip  CNS: confused and restless  Data Reviewed: Basic Metabolic Panel:  Recent Labs Lab 03/21/15 2207 03/22/15 0500  NA 141 140  K 3.7 4.0  CL 106 105  CO2 34* 30  GLUCOSE 99 115*  BUN 29* 27*   CREATININE 1.15 1.03  CALCIUM 8.6* 8.6*   Liver Function Tests:  Recent Labs Lab 03/22/15 0500  AST 27  ALT 16*  ALKPHOS 45  BILITOT 0.9  PROT 5.8*  ALBUMIN 3.3*   No results for input(s): LIPASE, AMYLASE in the last 168 hours. No results for input(s): AMMONIA in the last 168 hours. CBC:  Recent Labs Lab 03/21/15 2207 03/22/15 0500  WBC 9.7 11.4*  NEUTROABS 7.6 9.7*  HGB 10.0* 9.8*  HCT 32.1* 30.8*  MCV 105.2* 104.1*  PLT 232 199   Cardiac Enzymes: No results for input(s): CKTOTAL, CKMB, CKMBINDEX, TROPONINI in the last 168 hours. BNP (last 3 results) No results for input(s): BNP in the last 8760 hours.  ProBNP (last 3 results) No results for input(s): PROBNP in the last 8760 hours.  CBG: No results for input(s): GLUCAP in the last 168 hours.  Recent Results (from the past 240 hour(s))  Surgical pcr screen     Status: None   Collection Time: 03/22/15  7:54 AM  Result Value Ref Range Status   MRSA, PCR NEGATIVE NEGATIVE Final   Staphylococcus aureus NEGATIVE NEGATIVE Final    Comment:        The Xpert SA Assay (FDA approved for NASAL specimens in patients over 72 years of age), is one component of a comprehensive surveillance program.  Test performance has been validated by [facility] for patients greater than or equal to 63 year old. It is not intended to diagnose  infection nor to guide or monitor treatment.      Studies: Dg Chest 1 View  03/21/2015   CLINICAL DATA:  Unwitnessed fall.  EXAM: CHEST 1 VIEW  COMPARISON:  11/06/2014  FINDINGS: The heart size and mediastinal contours are within normal limits. Both lungs are clear. The visualized skeletal structures are unremarkable.  IMPRESSION: No active disease.   Electronically Signed   By: Ellery Plunk M.D.   On: 03/21/2015 21:49   Dg Pelvis 1-2 Views  03/21/2015   CLINICAL DATA:  Status post fall. Concern for pelvic injury. Right thigh pain. Initial encounter.  EXAM: PELVIS - 1-2 VIEW   COMPARISON:  Abdominal radiographs performed 11/06/2014 and 12/23/2011  FINDINGS: There is new cortical irregularity at the right femoral neck, raising suspicion for a subcapital fracture of the right femoral neck. The right femoral head remains seated at the acetabulum.  Mild sclerotic change is noted at the sacroiliac joints. The left hip joint is unremarkable in appearance, with a small osseous density lateral to the joint space likely reflecting a small loose body. The visualized bowel gas pattern is grossly unremarkable.  IMPRESSION: New cortical irregularity of the right femoral neck, raising suspicion for a subcapital fracture of the right femoral neck.   Electronically Signed   By: Roanna Raider M.D.   On: 03/21/2015 21:49   Dg Chest Port 1 View  03/22/2015   CLINICAL DATA:  Hypoxia.  EXAM: PORTABLE CHEST 1 VIEW  COMPARISON:  03/21/2015  FINDINGS: Heart size is normal. There is patchy infiltrate in the right lung base consistent with infectious process. No edema. Left lung is clear.  IMPRESSION: Right lower lobe infiltrate.   Electronically Signed   By: Norva Pavlov M.D.   On: 03/22/2015 14:04   Dg Knee Complete 4 Views Right  03/21/2015   CLINICAL DATA:  Unwitnessed fall at home now with right knee and thigh pain.  EXAM: RIGHT KNEE - COMPLETE 4+ VIEW  COMPARISON:  Right femur radiographs - earlier same day  FINDINGS: The lateral radiograph is degraded due to obliquity.  Osteopenia without definite displaced fracture. Mild degenerative change of the knee, most conspicuous within the medial compartment with joint space loss, subchondral sclerosis and osteophytosis. No evidence of chondrocalcinosis. No joint effusion or evidence of lipohemarthrosis. Adjacent vascular calcifications. No radiopaque foreign body.  IMPRESSION: Osteopenia without fracture or dislocation.   Electronically Signed   By: Simonne Come M.D.   On: 03/21/2015 21:49   Dg Femur, Min 2 Views Right  03/21/2015   CLINICAL DATA:   Fall  EXAM: RIGHT FEMUR 2 VIEWS  COMPARISON:  None.  FINDINGS: Subcapital femoral neck fracture with impaction is present. Osteopenia. Vascular calcifications are noted. No dislocation.  IMPRESSION: Acute subcapital right femoral neck fracture with impaction. Osteopenia.   Electronically Signed   By: Jolaine Click M.D.   On: 03/21/2015 21:49    Scheduled Meds: .  ceFAZolin (ANCEF) IV  2 g Intravenous To OR  . LORazepam  0.5 mg Intravenous Once  . tranexamic acid  (ORTHO-IV)  1,000 mg Intravenous To OR   Continuous Infusions: . sodium chloride 50 mL/hr at 03/22/15 0920     Time spent: 20 minutes    Edric Fetterman  [facility] Pager [phone] If 7PM-7AM, please contact night-coverage at [email or web address], password [facility] 03/22/2015, 2:36 PM  LOS: 0 days

## 2015-03-22 NOTE — Progress Notes (Signed)
Initial Nutrition Assessment  DOCUMENTATION CODES:   Severe malnutrition in context of chronic illness  INTERVENTION:   -Diet advancement per MD -Once diet advanced, recommend Ensure Enlive po BID, each supplement provides 350 kcal and 20 grams of protein -RD to continue to monitor  NUTRITION DIAGNOSIS:   Malnutrition related to chronic illness (mental status) as evidenced by severe depletion of body fat, severe depletion of muscle mass.  GOAL:   Patient will meet greater than or equal to 90% of their needs  MONITOR:   Diet advancement, Labs, Weight trends, Skin, I & O's  REASON FOR ASSESSMENT:   Consult Hip fracture protocol  ASSESSMENT:   81 y.o. male who complains of Right hip pain after a mechanical fall at home. Has severe dementia and lives with his wife, June. History obtained from her due to severe dementia. She states that he is a household ambulator with no assist devices. He doesn't remember who she is, and calls her "lady." she reports that he is limited by LE edema and dementia.  Pt in room with no family present. Pt nonverbal at this time. Per tech, pt does not speak to staff.  Pt may have surgery today or tomorrow. Currently NPO. Once diet is advanced, pt would benefit from nutritional supplements.  Nutrition-Focused physical exam completed. Findings are severe fat depletion, severe muscle depletion, and severe edema.  Labs reviewed: Elevated BUN  Diet Order:  Diet NPO time specified  Skin:  Reviewed, no issues  Last BM:  unknown  Height:   Ht Readings from Last 1 Encounters:  03/22/15  (1.727 m)    Weight:   Wt Readings from Last 1 Encounters:  03/22/15 131 lb 2.8 oz (59.5 kg)    Ideal Body Weight:  70 kg  BMI:  Body mass index is 19.95 kg/(m^2).  Estimated Nutritional Needs:   Kcal:  1500-1700  Protein:  80-90g  Fluid:  1.7L/day  EDUCATION NEEDS:   No education needs identified at this time  Tilda Franco, MS, RD,  LDN Pager: [phone] After Hours Pager: [phone]

## 2015-03-22 NOTE — Progress Notes (Signed)
Patient found to have RLL infiltrate on today's CXR. Started on abx for PNA. Due to recent desaturations to 80s, anesthesia felt it best to postpone surgery. Will follow.

## 2015-03-22 NOTE — Consult Note (Signed)
ORTHOPAEDIC CONSULTATION  REQUESTING PHYSICIAN: Eddie North, MD  PCP:  Martha Clan, MD  Chief Complaint: right hip pain  HPI: Wayne Schroeder is a 81 y.o. male who complains of  Right hip pain after a mechanical fall at home. Has severe dementia and lives with his wife, Wayne Schroeder. History obtained from her due to severe dementia. She states that he is a household ambulator with no assist devices. He doesn't remember who she is, and calls her "lady." she reports that he is limited by LE edema and dementia.  Past Medical History  Diagnosis Date  . Dementia    Past Surgical History  Procedure Laterality Date  . Back surgery     Social History   Social History  . Marital Status: Married    Spouse Name: N/A  . Number of Children: N/A  . Years of Education: N/A   Social History Main Topics  . Smoking status: Never Smoker   . Smokeless tobacco: None  . Alcohol Use: No  . Drug Use: No  . Sexual Activity: Not Asked   Other Topics Concern  . None   Social History Narrative   Family History  Problem Relation Age of Onset  . Dementia Other    No Known Allergies Prior to Admission medications   Medication Sig Start Date End Date Taking? Authorizing Provider  furosemide (LASIX) 20 MG tablet TAKE 1 TABLET BY MOUTH ONCE DAILY 02/25/15  Yes Historical Provider, MD  omeprazole (PRILOSEC) 20 MG capsule Take 1 capsule (20 mg total) by mouth 2 (two) times daily. Patient not taking: Reported on 03/21/2015 11/07/14   Rolland Porter, MD   Dg Chest 1 View  03/21/2015   CLINICAL DATA:  Unwitnessed fall.  EXAM: CHEST 1 VIEW  COMPARISON:  11/06/2014  FINDINGS: The heart size and mediastinal contours are within normal limits. Both lungs are clear. The visualized skeletal structures are unremarkable.  IMPRESSION: No active disease.   Electronically Signed   By: Ellery Plunk M.D.   On: 03/21/2015 21:49   Dg Pelvis 1-2 Views  03/21/2015   CLINICAL DATA:  Status post fall. Concern for pelvic  injury. Right thigh pain. Initial encounter.  EXAM: PELVIS - 1-2 VIEW  COMPARISON:  Abdominal radiographs performed 11/06/2014 and 12/23/2011  FINDINGS: There is new cortical irregularity at the right femoral neck, raising suspicion for a subcapital fracture of the right femoral neck. The right femoral head remains seated at the acetabulum.  Mild sclerotic change is noted at the sacroiliac joints. The left hip joint is unremarkable in appearance, with a small osseous density lateral to the joint space likely reflecting a small loose body. The visualized bowel gas pattern is grossly unremarkable.  IMPRESSION: New cortical irregularity of the right femoral neck, raising suspicion for a subcapital fracture of the right femoral neck.   Electronically Signed   By: Roanna Raider M.D.   On: 03/21/2015 21:49   Dg Knee Complete 4 Views Right  03/21/2015   CLINICAL DATA:  Unwitnessed fall at home now with right knee and thigh pain.  EXAM: RIGHT KNEE - COMPLETE 4+ VIEW  COMPARISON:  Right femur radiographs - earlier same day  FINDINGS: The lateral radiograph is degraded due to obliquity.  Osteopenia without definite displaced fracture. Mild degenerative change of the knee, most conspicuous within the medial compartment with joint space loss, subchondral sclerosis and osteophytosis. No evidence of chondrocalcinosis. No joint effusion or evidence of lipohemarthrosis. Adjacent vascular calcifications. No radiopaque foreign body.  IMPRESSION: Osteopenia without fracture or dislocation.   Electronically Signed   By: Simonne Come M.D.   On: 03/21/2015 21:49   Dg Femur, Min 2 Views Right  03/21/2015   CLINICAL DATA:  Fall  EXAM: RIGHT FEMUR 2 VIEWS  COMPARISON:  None.  FINDINGS: Subcapital femoral neck fracture with impaction is present. Osteopenia. Vascular calcifications are noted. No dislocation.  IMPRESSION: Acute subcapital right femoral neck fracture with impaction. Osteopenia.   Electronically Signed   By: Jolaine Click  M.D.   On: 03/21/2015 21:49    Positive ROS: All other systems have been reviewed and were otherwise negative with the exception of those mentioned in the HPI and as above.  Physical Exam: General: Alert, no acute distress, not cooperative with exam Cardiovascular: No pedal edema Respiratory: No cyanosis, no use of accessory musculature GI: No organomegaly, abdomen is soft and non-tender Skin: No lesions in the area of chief complaint Neurologic: Sensation intact distally Psychiatric: Patient is competent for consent with normal mood and affect Lymphatic: No axillary or cervical lymphadenopathy  MUSCULOSKELETAL: RLE shortened and externally rotated. Pain with logroll. 1+ DP pulses. Refuses to move feet / wiggle toes.  Assessment: Severe dementia BLE edema Displaced right femoral neck fracture  Plan: Patient is high risk for surgery, however surgery still recommended in order to mobilize and help with pain Discussed r/b/a with his wife, Wayne Schroeder The risks, benefits, and alternatives were discussed. There are risks associated with the surgery including, but not limited to, problems with anesthesia (death), infection, instability (giving out of the joint), dislocation, differences in leg length/angulation/rotation, fracture of bones, loosening or failure of implants, hematoma (blood accumulation) which may require surgical drainage, blood clots, pulmonary embolism, nerve injury (foot drop and lateral thigh numbness), and blood vessel injury. They understand these risks and elect to proceed.  Cont NPO for now OR tonight vs tomorrow    Garnet Koyanagi, MD Cell [phone]    03/22/2015 10:22 AM

## 2015-03-22 NOTE — Care Management Note (Signed)
Case Management Note  Patient Details  Name: Wayne Schroeder MRN: [id number] Date of Birth: 1925-05-15  Subjective/Objective:                   Displaced right femoral neck fracture Action/Plan: Discharge planning  Expected Discharge Date:                 Expected Discharge Plan:     In-House Referral:     Discharge planning Services  CM Consult  Post Acute Care Choice:    Choice offered to:     DME Arranged:    DME Agency:     HH Arranged:    HH Agency:     Status of Service:  In process, will continue to follow  Medicare Important Message Given:    Date Medicare IM Given:    Medicare IM give by:    Date Additional Medicare IM Given:    Additional Medicare Important Message give by:     If discussed at Long Length of Stay Meetings, dates discussed:    Additional Comments: Utilization Review complete.  Pt is from home where he lives with wife.  Disposition undetermined at this time. CM will follow for progression. Yves Dill, RN 03/22/2015, 1:29 PM

## 2015-03-23 ENCOUNTER — Encounter (HOSPITAL_COMMUNITY): Payer: Self-pay | Admitting: Certified Registered"

## 2015-03-23 ENCOUNTER — Inpatient Hospital Stay (HOSPITAL_COMMUNITY): Payer: Medicare Other

## 2015-03-23 ENCOUNTER — Inpatient Hospital Stay (HOSPITAL_COMMUNITY): Payer: Medicare Other | Admitting: Anesthesiology

## 2015-03-23 ENCOUNTER — Encounter (HOSPITAL_COMMUNITY)
Admission: EM | Disposition: A | Discharge status: Skilled Nursing Facility | Payer: Self-pay | Source: Home / Self Care | Attending: Internal Medicine

## 2015-03-23 DIAGNOSIS — S72001D Fracture of unspecified part of neck of right femur, subsequent encounter for closed fracture with routine healing: Secondary | ICD-10-CM

## 2015-03-23 DIAGNOSIS — J189 Pneumonia, unspecified organism: Secondary | ICD-10-CM

## 2015-03-23 DIAGNOSIS — S72001A Fracture of unspecified part of neck of right femur, initial encounter for closed fracture: Secondary | ICD-10-CM | POA: Diagnosis present

## 2015-03-23 LAB — CBC
HCT: 31.6 % — ABNORMAL LOW (ref 39.0–52.0)
HEMATOCRIT: 33.4 % — AB (ref 39.0–52.0)
HEMOGLOBIN: 10.5 g/dL — AB (ref 13.0–17.0)
HEMOGLOBIN: 10.2 g/dL — AB (ref 13.0–17.0)
MCH: 32.3 pg (ref 26.0–34.0)
MCH: 33.1 pg (ref 26.0–34.0)
MCHC: 31.4 g/dL (ref 30.0–36.0)
MCHC: 32.3 g/dL (ref 30.0–36.0)
MCV: 102.8 fL — ABNORMAL HIGH (ref 78.0–100.0)
MCV: 102.6 fL — ABNORMAL HIGH (ref 78.0–100.0)
Platelets: 182 10*3/uL (ref 150–400)
Platelets: 187 10*3/uL (ref 150–400)
RBC: 3.25 MIL/uL — AB (ref 4.22–5.81)
RBC: 3.08 MIL/uL — ABNORMAL LOW (ref 4.22–5.81)
RDW: 12.7 % (ref 11.5–15.5)
RDW: 12.7 % (ref 11.5–15.5)
WBC: 7.1 10*3/uL (ref 4.0–10.5)
WBC: 10 10*3/uL (ref 4.0–10.5)

## 2015-03-23 LAB — CREATININE, SERUM
CREATININE: 0.96 mg/dL (ref 0.61–1.24)
GFR calc Af Amer: >60 mL/min (ref >60)
GFR calc non Af Amer: >60 mL/min (ref >60)

## 2015-03-23 SURGERY — HEMIARTHROPLASTY, HIP, DIRECT ANTERIOR APPROACH, FOR FRACTURE
Anesthesia: General | Site: Hip | Laterality: Right

## 2015-03-23 MED ORDER — SUCCINYLCHOLINE CHLORIDE 20 MG/ML IJ SOLN
INTRAMUSCULAR | Status: DC | PRN
  Administered 2015-03-23: 80 mg via INTRAVENOUS

## 2015-03-23 MED ORDER — PROPOFOL 10 MG/ML IV BOLUS
INTRAVENOUS | Status: AC
  Filled 2015-03-23: qty 20

## 2015-03-23 MED ORDER — BUPIVACAINE-EPINEPHRINE (PF) 0.25% -1:200000 IJ SOLN
INTRAMUSCULAR | Status: AC
  Filled 2015-03-23: qty 30

## 2015-03-23 MED ORDER — EPHEDRINE SULFATE 50 MG/ML IJ SOLN
INTRAMUSCULAR | Status: AC
  Filled 2015-03-23: qty 1

## 2015-03-23 MED ORDER — KETOROLAC TROMETHAMINE 30 MG/ML IJ SOLN
INTRAMUSCULAR | Status: AC
  Filled 2015-03-23: qty 1

## 2015-03-23 MED ORDER — HYDROGEN PEROXIDE 3 % EX SOLN
CUTANEOUS | Status: DC | PRN
  Administered 2015-03-23: 1

## 2015-03-23 MED ORDER — ESMOLOL HCL 10 MG/ML IV SOLN
INTRAVENOUS | Status: AC
  Filled 2015-03-23: qty 10

## 2015-03-23 MED ORDER — ISOPROPYL ALCOHOL 70 % SOLN
Status: DC | PRN
  Administered 2015-03-23: 1 via TOPICAL

## 2015-03-23 MED ORDER — WATER FOR IRRIGATION, STERILE IR SOLN
Status: DC | PRN
  Administered 2015-03-23: 1000 mL

## 2015-03-23 MED ORDER — PHENYLEPHRINE HCL 10 MG/ML IJ SOLN
INTRAMUSCULAR | Status: DC | PRN
  Administered 2015-03-23 (×4): 80 ug via INTRAVENOUS

## 2015-03-23 MED ORDER — PHENYLEPHRINE HCL 10 MG/ML IJ SOLN
10.0000 mg | INTRAVENOUS | Status: DC | PRN
  Administered 2015-03-23: 100 ug/min via INTRAVENOUS

## 2015-03-23 MED ORDER — LIDOCAINE HCL (PF) 2 % IJ SOLN
INTRAMUSCULAR | Status: DC | PRN
  Administered 2015-03-23: 30 mg via INTRADERMAL

## 2015-03-23 MED ORDER — METOCLOPRAMIDE HCL 10 MG PO TABS: 5.0000 mg | ORAL_TABLET | Freq: Three times a day (TID) | ORAL | Status: DC | PRN

## 2015-03-23 MED ORDER — POVIDONE-IODINE 10 % EX SOLN
CUTANEOUS | Status: DC | PRN
  Administered 2015-03-23: 1 via TOPICAL

## 2015-03-23 MED ORDER — METOCLOPRAMIDE HCL 5 MG/ML IJ SOLN: 5.0000 mg | Freq: Three times a day (TID) | INTRAMUSCULAR | Status: DC | PRN

## 2015-03-23 MED ORDER — BUPIVACAINE-EPINEPHRINE 0.25% -1:200000 IJ SOLN
INTRAMUSCULAR | Status: DC | PRN
Start: 2015-03-23 — End: 2015-03-23
  Administered 2015-03-23: 30 mL

## 2015-03-23 MED ORDER — ISOPROPYL ALCOHOL 70 % SOLN
Status: AC
  Filled 2015-03-23: qty 480

## 2015-03-23 MED ORDER — HALOPERIDOL LACTATE 5 MG/ML IJ SOLN
2.0000 mg | Freq: Once | INTRAMUSCULAR | Status: AC
  Administered 2015-03-23: 2 mg via INTRAVENOUS
  Filled 2015-03-23: qty 1

## 2015-03-23 MED ORDER — SODIUM CHLORIDE 0.9 % IV SOLN
INTRAVENOUS | Status: DC | PRN
  Administered 2015-03-23: 17:00:00 via INTRAVENOUS

## 2015-03-23 MED ORDER — SODIUM CHLORIDE 0.9 % IJ SOLN
INTRAMUSCULAR | Status: AC
  Filled 2015-03-23: qty 50

## 2015-03-23 MED ORDER — KETOROLAC TROMETHAMINE 30 MG/ML IJ SOLN
INTRAMUSCULAR | Status: DC | PRN
  Administered 2015-03-23: 30 mg

## 2015-03-23 MED ORDER — CEFAZOLIN SODIUM-DEXTROSE 2-3 GM-% IV SOLR
INTRAVENOUS | Status: AC
  Filled 2015-03-23: qty 50

## 2015-03-23 MED ORDER — ENOXAPARIN SODIUM 40 MG/0.4ML ~~LOC~~ SOLN
40.0000 mg | SUBCUTANEOUS | Status: DC
  Administered 2015-03-24 – 2015-03-25 (×2): 40 mg via SUBCUTANEOUS
  Filled 2015-03-23 (×3): qty 0.4

## 2015-03-23 MED ORDER — SODIUM CHLORIDE 0.9 % IV SOLN
1000.0000 mg | INTRAVENOUS | Status: AC
Start: 2015-03-23 — End: 2015-03-23
  Administered 2015-03-23: 1000 mg via INTRAVENOUS
  Filled 2015-03-23: qty 10

## 2015-03-23 MED ORDER — ONDANSETRON HCL 4 MG PO TABS: 4.0000 mg | ORAL_TABLET | Freq: Four times a day (QID) | ORAL | Status: DC | PRN

## 2015-03-23 MED ORDER — ONDANSETRON HCL 4 MG/2ML IJ SOLN
INTRAMUSCULAR | Status: AC
  Filled 2015-03-23: qty 2

## 2015-03-23 MED ORDER — CEFAZOLIN SODIUM-DEXTROSE 2-3 GM-% IV SOLR
2.0000 g | Freq: Four times a day (QID) | INTRAVENOUS | Status: AC
  Administered 2015-03-23 – 2015-03-24 (×2): 2 g via INTRAVENOUS
  Filled 2015-03-23 (×2): qty 50

## 2015-03-23 MED ORDER — EPHEDRINE SULFATE 50 MG/ML IJ SOLN
INTRAMUSCULAR | Status: DC | PRN
  Administered 2015-03-23 (×2): 10 mg via INTRAVENOUS
  Administered 2015-03-23: 5 mg via INTRAVENOUS

## 2015-03-23 MED ORDER — FENTANYL CITRATE (PF) 100 MCG/2ML IJ SOLN
INTRAMUSCULAR | Status: AC
  Filled 2015-03-23: qty 4

## 2015-03-23 MED ORDER — ACETAMINOPHEN 325 MG PO TABS: 650.0000 mg | ORAL_TABLET | Freq: Four times a day (QID) | ORAL | Status: DC | PRN

## 2015-03-23 MED ORDER — LACTATED RINGERS IV SOLN: INTRAVENOUS | Status: DC

## 2015-03-23 MED ORDER — ONDANSETRON HCL 4 MG/2ML IJ SOLN
INTRAMUSCULAR | Status: DC | PRN
  Administered 2015-03-23: 4 mg via INTRAVENOUS

## 2015-03-23 MED ORDER — FENTANYL CITRATE (PF) 100 MCG/2ML IJ SOLN
INTRAMUSCULAR | Status: DC | PRN
  Administered 2015-03-23 (×2): 50 ug via INTRAVENOUS

## 2015-03-23 MED ORDER — PROPOFOL 10 MG/ML IV BOLUS
INTRAVENOUS | Status: DC | PRN
  Administered 2015-03-23: 80 mg via INTRAVENOUS

## 2015-03-23 MED ORDER — SODIUM CHLORIDE 0.9 % IV SOLN
INTRAVENOUS | Status: AC
Start: 2015-03-23 — End: 2015-03-24
  Administered 2015-03-23: via INTRAVENOUS

## 2015-03-23 MED ORDER — HYDROGEN PEROXIDE 3 % EX SOLN
CUTANEOUS | Status: AC
  Filled 2015-03-23: qty 473

## 2015-03-23 MED ORDER — FENTANYL CITRATE (PF) 100 MCG/2ML IJ SOLN: 25.0000 ug | INTRAMUSCULAR | Status: DC | PRN

## 2015-03-23 MED ORDER — SODIUM CHLORIDE 0.9 % IR SOLN
Status: DC | PRN
  Administered 2015-03-23: 1000 mL
  Administered 2015-03-23: 3000 mL

## 2015-03-23 MED ORDER — LACTATED RINGERS IV SOLN
INTRAVENOUS | Status: DC | PRN
  Administered 2015-03-23 (×2): via INTRAVENOUS

## 2015-03-23 MED ORDER — PHENOL 1.4 % MT LIQD: 1.0000 | OROMUCOSAL | Status: DC | PRN

## 2015-03-23 MED ORDER — ACETAMINOPHEN 650 MG RE SUPP: 650.0000 mg | Freq: Four times a day (QID) | RECTAL | Status: DC | PRN

## 2015-03-23 MED ORDER — ONDANSETRON HCL 4 MG/2ML IJ SOLN
4.0000 mg | Freq: Four times a day (QID) | INTRAMUSCULAR | Status: DC | PRN
  Filled 2015-03-23: qty 2

## 2015-03-23 MED ORDER — SODIUM CHLORIDE 0.9 % IJ SOLN
INTRAMUSCULAR | Status: DC | PRN
  Administered 2015-03-23: 30 mL

## 2015-03-23 MED ORDER — MENTHOL 3 MG MT LOZG: 1.0000 | LOZENGE | OROMUCOSAL | Status: DC | PRN

## 2015-03-23 SURGICAL SUPPLY — 49 items
BAG DECANTER FOR FLEXI CONT (MISCELLANEOUS) IMPLANT
BAG SPEC THK2 15X12 ZIP CLS (MISCELLANEOUS)
BAG ZIPLOCK 12X15 (MISCELLANEOUS) IMPLANT
CAPT HIP HEMI 2 ×2 IMPLANT
CHLORAPREP W/TINT 26ML (MISCELLANEOUS) ×3 IMPLANT
COVER PERINEAL POST (MISCELLANEOUS) ×3 IMPLANT
DECANTER SPIKE VIAL GLASS SM (MISCELLANEOUS) ×3 IMPLANT
DRAPE C-ARM 42X120 X-RAY (DRAPES) ×3 IMPLANT
DRAPE LG THREE QUARTER DISP (DRAPES) ×6 IMPLANT
DRAPE STERI IOBAN 125X83 (DRAPES) ×3 IMPLANT
DRAPE U-SHAPE 47X51 STRL (DRAPES) ×9 IMPLANT
DRSG AQUACEL AG ADV 3.5X10 (GAUZE/BANDAGES/DRESSINGS) ×3 IMPLANT
ELECT BLADE TIP CTD 4 INCH (ELECTRODE) ×3 IMPLANT
ELECT PENCIL ROCKER SW 15FT (MISCELLANEOUS) ×3 IMPLANT
ELECT REM PT RETURN 15FT ADLT (MISCELLANEOUS) ×3 IMPLANT
FACESHIELD WRAPAROUND (MASK) ×6 IMPLANT
FACESHIELD WRAPAROUND OR TEAM (MASK) ×2 IMPLANT
GAUZE SPONGE 4X4 12PLY STRL (GAUZE/BANDAGES/DRESSINGS) ×1 IMPLANT
GLOVE BIO SURGEON STRL SZ8.5 (GLOVE) ×6 IMPLANT
GLOVE BIOGEL PI IND STRL 8.5 (GLOVE) ×1 IMPLANT
GLOVE BIOGEL PI INDICATOR 8.5 (GLOVE) ×2
GOWN SPEC L3 XXLG W/TWL (GOWN DISPOSABLE) ×3 IMPLANT
HANDPIECE INTERPULSE COAX TIP (DISPOSABLE) ×3
HOLDER FOLEY CATH W/STRAP (MISCELLANEOUS) ×3 IMPLANT
HOOD PEEL AWAY FACE SHEILD DIS (HOOD) ×6 IMPLANT
KIT BASIN OR (CUSTOM PROCEDURE TRAY) ×3 IMPLANT
LIQUID BAND (GAUZE/BANDAGES/DRESSINGS) ×4 IMPLANT
NDL SPNL 18GX3.5 QUINCKE PK (NEEDLE) ×1 IMPLANT
NEEDLE SPNL 18GX3.5 QUINCKE PK (NEEDLE) ×3 IMPLANT
PACK TOTAL JOINT (CUSTOM PROCEDURE TRAY) ×3 IMPLANT
PEN SKIN MARKING BROAD (MISCELLANEOUS) ×3 IMPLANT
SAW OSC TIP CART 19.5X105X1.3 (SAW) ×3 IMPLANT
SEALER BIPOLAR AQUA 6.0 (INSTRUMENTS) ×3 IMPLANT
SET HNDPC FAN SPRY TIP SCT (DISPOSABLE) ×1 IMPLANT
SOL PREP POV-IOD 4OZ 10% (MISCELLANEOUS) ×3 IMPLANT
SUT ETHIBOND NAB CT1 #1 30IN (SUTURE) ×6 IMPLANT
SUT MNCRL AB 3-0 PS2 18 (SUTURE) ×3 IMPLANT
SUT MON AB 2-0 CT1 36 (SUTURE) ×6 IMPLANT
SUT VIC AB 1 CT1 36 (SUTURE) ×3 IMPLANT
SUT VIC AB 2-0 CT1 27 (SUTURE) ×3
SUT VIC AB 2-0 CT1 TAPERPNT 27 (SUTURE) ×1 IMPLANT
SUT VLOC 180 0 24IN GS25 (SUTURE) ×3 IMPLANT
SYR 50ML LL SCALE MARK (SYRINGE) ×3 IMPLANT
TOWEL OR 17X26 10 PK STRL BLUE (TOWEL DISPOSABLE) ×3 IMPLANT
TOWEL OR NON WOVEN STRL DISP B (DISPOSABLE) ×3 IMPLANT
TRAY FOLEY W/METER SILVER 14FR (SET/KITS/TRAYS/PACK) ×1 IMPLANT
TRAY FOLEY W/METER SILVER 16FR (SET/KITS/TRAYS/PACK) ×1 IMPLANT
WATER STERILE IRR 1500ML POUR (IV SOLUTION) ×3 IMPLANT
YANKAUER SUCT BULB TIP 10FT TU (MISCELLANEOUS) ×3 IMPLANT

## 2015-03-23 NOTE — Anesthesia Procedure Notes (Signed)
Procedure Name: Intubation Date/Time: 03/23/2015 5:55 PM Performed by: Early Osmond E Pre-anesthesia Checklist: Patient identified, Emergency Drugs available, Suction available and Patient being monitored Patient Re-evaluated:Patient Re-evaluated prior to inductionOxygen Delivery Method: Circle System Utilized Preoxygenation: Pre-oxygenation with 100% oxygen Intubation Type: IV induction Ventilation: Mask ventilation without difficulty Laryngoscope Size: Miller and 2 Grade View: Grade II Tube type: Oral Tube size: 7.0 mm Number of attempts: 1 Airway Equipment and Method: Stylet Placement Confirmation: ETT inserted through vocal cords under direct vision,  positive ETCO2 and breath sounds checked- equal and bilateral Secured at: 20 cm Tube secured with: Tape Dental Injury: Teeth and Oropharynx as per pre-operative assessment

## 2015-03-23 NOTE — Plan of Care (Signed)
Problem: Phase I Progression Outcomes Goal: Voiding-avoid urinary catheter unless indicated Outcome: Completed/Met Date Met:  03/23/15 incontinent     

## 2015-03-23 NOTE — Progress Notes (Signed)
CSW consulted to assist with d/c planning. Spoke with nsg this am. Pt is confused / no family at bedside. Surgery is pending. SNF may be needed at d/c. CSW will continue to follow to assist with d/c planning.  Cori Razor LCSW [phone]

## 2015-03-23 NOTE — Op Note (Signed)
OPERATIVE REPORT  SURGEON: Samson Frederic, MD   ASSISTANT: staff.  PREOPERATIVE DIAGNOSIS: Displaced Right femoral neck fracture.   POSTOPERATIVE DIAGNOSIS: Displaced Right femoral neck fracture.   PROCEDURE: Right hip hemiarthroplasty, anterior approach.   IMPLANTS: DePuy Tri Lock stem, size 8, std offset, with a -3 mm spacer and a 53 mm monopolar head ball.  ANESTHESIA:  General  ANTIBIOTICS: 2g ancef.  ESTIMATED BLOOD LOSS: 200 mL.  DRAINS: None.  COMPLICATIONS: None   CONDITION: PACU - hemodynamically stable.   BRIEF CLINICAL NOTE: Wayne Schroeder is a 81 y.o. male with a displaced Right femoral neck fracture. The patient was admitted to the hospitalist service and underwent perioperative risk stratification and medical optimization. The risks, benefits, and alternatives to hemiarthroplasty were explained, and the patient elected to proceed.  PROCEDURE IN DETAIL: The patient was taken to the operating room and general anesthesia was induced on the hospital bed. The patient was then positioned on the Hana table. All bony prominences were well padded. The hip was prepped and draped in the normal sterile surgical fashion. A time-out was called verifying side and site of surgery. Antibiotics were given within 60 minutes of beginning the procedure.  The direct anterior approach to the hip was performed through the Hueter interval. Lateral femoral circumflex vessels were treated with the Auqumantys. The anterior capsule was exposed and an inverted T capsulotomy was made. Fracture hematoma was encountered and evacuated. The patient was found to have a comminuted Right subcapital femoral neck fracture. I freshened the femoral neck cut with a saw. I removed the femoral neck fragment. A corkscrew was placed into the head and the head was removed. This was passed to the back table and was measured.  Acetabular exposure was achieved. I examined the articular cartilage  which was intact. The labrum was intact. A 53 mm trial head was placed and found to have excellent fit.  I then gained femoral exposure taking care to protect the abductors and greater trochanter. This was performed using standard external rotation, extension, and adduction. The capsule was peeled off the inner aspect of the greater trochanter, taking care to preserve the short external rotators. A cookie cutter was used to enter the femoral canal, and then the femoral canal finder was used to confirm location. I then sequentially broached up to a size 8. Calcar planer was used on the femoral neck remnant. I paced a std neck and a 36+ 0 head ball.The hip was reduced. Leg lengths were checked fluoroscopically. The hip was dislocated and trial components were removed. I placed the real stem followed by the real spacer and head ball. A single reduction maneuver was performed and the hip was reduced. Fluoroscopy was used to confirm component position and leg lengths. At 90 degrees of external rotation and extension, the hip was stable to an anterior directed force.  The wound was copiously irrigated with normal saline solution. Marcaine solution was injected into the periarticular soft tissue. The wound was closed in layers using #1 Vicryl and V-Loc for the fascia, 2-0 Vicryl for the subcutaneous fat, 2-0 Monocryl for the deep dermal layer, 3-0 running Monocryl subcuticular stitch and glue for the skin. Once the glue was fully dried, an Aquacell Ag dressing was applied. The patient was then awakened from anesthesia and transported to the recovery room in stable condition. Sponge, needle, and instrument counts were correct at the end of the case x2. The patient tolerated the procedure well and there were no known complications.

## 2015-03-23 NOTE — Discharge Instructions (Signed)
°Dr. Ndia Sampath °Joint Replacement Specialist °[facility] °[address] °[place], [place] [address] °[phone] ° ° °TOTAL HIP REPLACEMENT POSTOPERATIVE DIRECTIONS ° ° ° °Hip Rehabilitation, Guidelines Following Surgery  ° °WEIGHT BEARING °Weight bearing as tolerated with assist device (walker, cane, etc) as directed, use it as long as suggested by your surgeon or therapist, typically at least 4-6 weeks. ° °The results of a hip operation are greatly improved after range of motion and muscle strengthening exercises. Follow all safety measures which are given to protect your hip. If any of these exercises cause increased pain or swelling in your joint, decrease the amount until you are comfortable again. Then slowly increase the exercises. Call your caregiver if you have problems or questions.  ° °HOME CARE INSTRUCTIONS  °Most of the following instructions are designed to prevent the dislocation of your new hip.  °Remove items at home which could result in a fall. This includes throw rugs or furniture in walking pathways.  °Continue medications as instructed at time of discharge. °· You may have some home medications which will be placed on hold until you complete the course of blood thinner medication. °· You may start showering once you are discharged home. Do not remove your dressing. °Do not put on socks or shoes without following the instructions of your caregivers.   °Sit on chairs with arms. Use the chair arms to help push yourself up when arising.  °Arrange for the use of a toilet seat elevator so you are not sitting low.  °· Walk with walker as instructed.  °You may resume a sexual relationship in one month or when given the OK by your caregiver.  °Use walker as long as suggested by your caregivers.  °You may put full weight on your legs and walk as much as is comfortable. °Avoid periods of inactivity such as sitting longer than an hour when not asleep. This helps prevent  blood clots.  °You may return to work once you are cleared by your surgeon.  °Do not drive a car for 6 weeks or until released by your surgeon.  °Do not drive while taking narcotics.  °Wear elastic stockings for two weeks following surgery during the day but you may remove then at night.  °Make sure you keep all of your appointments after your operation with all of your doctors and caregivers. You should call the office at the above phone number and make an appointment for approximately two weeks after the date of your surgery. °Please pick up a stool softener and laxative for home use as long as you are requiring pain medications. °· ICE to the affected hip every three hours for 30 minutes at a time and then as needed for pain and swelling. Continue to use ice on the hip for pain and swelling from surgery. You may notice swelling that will progress down to the foot and ankle.  This is normal after surgery.  Elevate the leg when you are not up walking on it.   °It is important for you to complete the blood thinner medication as prescribed by your doctor. °· Continue to use the breathing machine which will help keep your temperature down.  It is common for your temperature to cycle up and down following surgery, especially at night when you are not up moving around and exerting yourself.  The breathing machine keeps your lungs expanded and your temperature down. ° °RANGE OF MOTION AND STRENGTHENING EXERCISES  °These exercises are   designed to help you keep full movement of your hip joint. Follow your caregiver's or physical therapist's instructions. Perform all exercises about fifteen times, three times per day or as directed. Exercise both hips, even if you have had only one joint replacement. These exercises can be done on a training (exercise) mat, on the floor, on a table or on a bed. Use whatever works the best and is most comfortable for you. Use music or television while you are exercising so that the exercises  are a pleasant break in your day. This will make your life better with the exercises acting as a break in routine you can look forward to.  °Lying on your back, slowly slide your foot toward your buttocks, raising your knee up off the floor. Then slowly slide your foot back down until your leg is straight again.  °Lying on your back spread your legs as far apart as you can without causing discomfort.  °Lying on your side, raise your upper leg and foot straight up from the floor as far as is comfortable. Slowly lower the leg and repeat.  °Lying on your back, tighten up the muscle in the front of your thigh (quadriceps muscles). You can do this by keeping your leg straight and trying to raise your heel off the floor. This helps strengthen the largest muscle supporting your knee.  °Lying on your back, tighten up the muscles of your buttocks both with the legs straight and with the knee bent at a comfortable angle while keeping your heel on the floor.  ° °SKILLED REHAB INSTRUCTIONS: °If the patient is transferred to a skilled rehab facility following release from the hospital, a list of the current medications will be sent to the facility for the patient to continue.  When discharged from the skilled rehab facility, please have the facility set up the patient's Home Health Physical Therapy prior to being released. Also, the skilled facility will be responsible for providing the patient with their medications at time of release from the facility to include their pain medication and their blood thinner medication. If the patient is still at the rehab facility at time of the two week follow up appointment, the skilled rehab facility will also need to assist the patient in arranging follow up appointment in our office and any transportation needs. ° °MAKE SURE YOU:  °Understand these instructions.  °Will watch your condition.  °Will get help right away if you are not doing well or get worse. ° °Pick up stool softner and  laxative for home use following surgery while on pain medications. °Do not remove your dressing. °The dressing is waterproof--it is OK to take showers. °Continue to use ice for pain and swelling after surgery. °Do not use any lotions or creams on the incision until instructed by your surgeon. °Total Hip Protocol. ° ° °

## 2015-03-23 NOTE — Progress Notes (Signed)
SLP Cancellation Note  Patient Details Name: Wayne Schroeder MRN: [id number] DOB: 28-Aug-1924   Cancelled treatment:       Reason Eval/Treat Not Completed: Other (comment) (Patient NPO for surgery; BSE could not be completed)   Angela Nevin, MA, CCC-SLP 03/23/2015 3:23 PM

## 2015-03-23 NOTE — Interval H&P Note (Signed)
History and Physical Interval Note:  03/23/2015 5:47 PM  Wayne Schroeder  has presented today for surgery, with the diagnosis of right femoral neck fracture  The various methods of treatment have been discussed with the patient and family. After consideration of risks, benefits and other options for treatment, the patient has consented to  Procedure(s): ANTERIOR APPROACH HEMI HIP ARTHROPLASTY (Right) as a surgical intervention .  The patient's history has been reviewed, patient examined, no change in status, stable for surgery.  I have reviewed the patient's chart and labs.  Questions were answered to the patient's satisfaction.     Taniah Reinecke, Cloyde Reams

## 2015-03-23 NOTE — Transfer of Care (Signed)
Immediate Anesthesia Transfer of Care Note  Patient: Wayne Schroeder  Procedure(s) Performed: Procedure(s): RIGHT HIP ANTERIOR APPROACH HEMI ARTHROPLASTY (Right)  Patient Location: PACU  Anesthesia Type:General  Level of Consciousness:  sedated, patient cooperative and responds to stimulation  Airway & Oxygen Therapy:Patient Spontanous Breathing and Patient connected to face mask oxgen  Post-op Assessment:  Report given to PACU RN and Post -op Vital signs reviewed and stable  Post vital signs:  Reviewed and stable  Last Vitals:  Filed Vitals:   03/23/15 1622  BP: 131/64  Pulse:   Temp:   Resp:     Complications: No apparent anesthesia complications

## 2015-03-23 NOTE — H&P (View-Only) (Signed)
 ORTHOPAEDIC CONSULTATION  REQUESTING PHYSICIAN: Nishant Dhungel, MD  PCP:  Shaw, William, MD  Chief Complaint: right hip pain  HPI: Wayne Schroeder is a 81 y.o. male who complains of  Right hip pain after a mechanical fall at home. Has severe dementia and lives with his wife, Wayne Schroeder. History obtained from her due to severe dementia. She states that he is a household ambulator with no assist devices. He doesn't remember who she is, and calls her "lady." she reports that he is limited by LE edema and dementia.  Past Medical History  Diagnosis Date  . Dementia    Past Surgical History  Procedure Laterality Date  . Back surgery     Social History   Social History  . Marital Status: Married    Spouse Name: N/A  . Number of Children: N/A  . Years of Education: N/A   Social History Main Topics  . Smoking status: Never Smoker   . Smokeless tobacco: None  . Alcohol Use: No  . Drug Use: No  . Sexual Activity: Not Asked   Other Topics Concern  . None   Social History Narrative   Family History  Problem Relation Age of Onset  . Dementia Other    No Known Allergies Prior to Admission medications   Medication Sig Start Date End Date Taking? Authorizing Provider  furosemide (LASIX) 20 MG tablet TAKE 1 TABLET BY MOUTH ONCE DAILY 02/25/15  Yes Historical Provider, MD  omeprazole (PRILOSEC) 20 MG capsule Take 1 capsule (20 mg total) by mouth 2 (two) times daily. Patient not taking: Reported on 03/21/2015 11/07/14   Mark James, MD   Dg Chest 1 View  03/21/2015   CLINICAL DATA:  Unwitnessed fall.  EXAM: CHEST 1 VIEW  COMPARISON:  11/06/2014  FINDINGS: The heart size and mediastinal contours are within normal limits. Both lungs are clear. The visualized skeletal structures are unremarkable.  IMPRESSION: No active disease.   Electronically Signed   By: Daniel R Mitchell M.D.   On: 03/21/2015 21:49   Dg Pelvis 1-2 Views  03/21/2015   CLINICAL DATA:  Status post fall. Concern for pelvic  injury. Right thigh pain. Initial encounter.  EXAM: PELVIS - 1-2 VIEW  COMPARISON:  Abdominal radiographs performed 11/06/2014 and 12/23/2011  FINDINGS: There is new cortical irregularity at the right femoral neck, raising suspicion for a subcapital fracture of the right femoral neck. The right femoral head remains seated at the acetabulum.  Mild sclerotic change is noted at the sacroiliac joints. The left hip joint is unremarkable in appearance, with a small osseous density lateral to the joint space likely reflecting a small loose body. The visualized bowel gas pattern is grossly unremarkable.  IMPRESSION: New cortical irregularity of the right femoral neck, raising suspicion for a subcapital fracture of the right femoral neck.   Electronically Signed   By: Jeffery  Chang M.D.   On: 03/21/2015 21:49   Dg Knee Complete 4 Views Right  03/21/2015   CLINICAL DATA:  Unwitnessed fall at home now with right knee and thigh pain.  EXAM: RIGHT KNEE - COMPLETE 4+ VIEW  COMPARISON:  Right femur radiographs - earlier same day  FINDINGS: The lateral radiograph is degraded due to obliquity.  Osteopenia without definite displaced fracture. Mild degenerative change of the knee, most conspicuous within the medial compartment with joint space loss, subchondral sclerosis and osteophytosis. No evidence of chondrocalcinosis. No joint effusion or evidence of lipohemarthrosis. Adjacent vascular calcifications. No radiopaque foreign body.    IMPRESSION: Osteopenia without fracture or dislocation.   Electronically Signed   By: Simonne Come M.D.   On: 03/21/2015 21:49   Dg Femur, Min 2 Views Right  03/21/2015   CLINICAL DATA:  Fall  EXAM: RIGHT FEMUR 2 VIEWS  COMPARISON:  None.  FINDINGS: Subcapital femoral neck fracture with impaction is present. Osteopenia. Vascular calcifications are noted. No dislocation.  IMPRESSION: Acute subcapital right femoral neck fracture with impaction. Osteopenia.   Electronically Signed   By: Jolaine Click  M.D.   On: 03/21/2015 21:49    Positive ROS: All other systems have been reviewed and were otherwise negative with the exception of those mentioned in the HPI and as above.  Physical Exam: General: Alert, no acute distress, not cooperative with exam Cardiovascular: No pedal edema Respiratory: No cyanosis, no use of accessory musculature GI: No organomegaly, abdomen is soft and non-tender Skin: No lesions in the area of chief complaint Neurologic: Sensation intact distally Psychiatric: Patient is competent for consent with normal mood and affect Lymphatic: No axillary or cervical lymphadenopathy  MUSCULOSKELETAL: RLE shortened and externally rotated. Pain with logroll. 1+ DP pulses. Refuses to move feet / wiggle toes.  Assessment: Severe dementia BLE edema Displaced right femoral neck fracture  Plan: Patient is high risk for surgery, however surgery still recommended in order to mobilize and help with pain Discussed r/b/a with his wife, Wayne Schroeder The risks, benefits, and alternatives were discussed. There are risks associated with the surgery including, but not limited to, problems with anesthesia (death), infection, instability (giving out of the joint), dislocation, differences in leg length/angulation/rotation, fracture of bones, loosening or failure of implants, hematoma (blood accumulation) which may require surgical drainage, blood clots, pulmonary embolism, nerve injury (foot drop and lateral thigh numbness), and blood vessel injury. They understand these risks and elect to proceed.  Cont NPO for now OR tonight vs tomorrow    Garnet Koyanagi, MD Cell [phone]    03/22/2015 10:22 AM

## 2015-03-23 NOTE — Anesthesia Preprocedure Evaluation (Addendum)
Anesthesia Evaluation  Patient identified by MRN, date of birth, ID band Patient awake    Reviewed: Allergy & Precautions, H&P , NPO status , Patient's Chart, lab work & pertinent test results  Airway Mallampati: II  TM Distance: >3 FB Neck ROM: full    Dental no notable dental hx. (+) Dental Advisory Given   Pulmonary neg pulmonary ROS, pneumonia, resolved,  RLL pneumonia yesterday but RA O2 sats 100% late last night.   Pulmonary exam normal breath sounds clear to auscultation       Cardiovascular Exercise Tolerance: Good negative cardio ROS Normal cardiovascular exam Rhythm:regular Rate:Normal     Neuro/Psych dementia negative neurological ROS  negative psych ROS   GI/Hepatic negative GI ROS, Neg liver ROS,   Endo/Other  negative endocrine ROS  Renal/GU negative Renal ROS  negative genitourinary   Musculoskeletal   Abdominal   Peds  Hematology negative hematology ROS (+) anemia , hgb 10.5   Anesthesia Other Findings   Reproductive/Obstetrics negative OB ROS                            Anesthesia Physical Anesthesia Plan  ASA: III  Anesthesia Plan: General   Post-op Pain Management:    Induction: Intravenous  Airway Management Planned: Oral ETT  Additional Equipment:   Intra-op Plan:   Post-operative Plan: Extubation in OR  Informed Consent: I have reviewed the patients History and Physical, chart, labs and discussed the procedure including the risks, benefits and alternatives for the proposed anesthesia with the patient or authorized representative who has indicated his/her understanding and acceptance.   Dental Advisory Given  Plan Discussed with: CRNA and Surgeon  Anesthesia Plan Comments:         Anesthesia Quick Evaluation

## 2015-03-23 NOTE — Progress Notes (Signed)
[facility] PROGRESS NOTE  Wayne Schroeder NGE:[id number] DOB: 1924-11-25 DOA: 03/21/2015 PCP: Martha Clan, MD  Brief narrative   81 year old male with severe dementia from home, lower extremity edema on Lasix presented to the ER after sustaining a mechanical fall at home with a right hip fracture. Patient seen or through with plan on surgery which has been delayed due to development of pneumonia.  Assessment/Plan: Displaced Right hip fracture S/p mecahnical fall. Ortho consulted. Pain cotnrol with prn percocet and low dose morphine. add robaxin post op. EKG normal. No cardiac history. Patient is at  Mild to moderate perioperative risk for surgery. No further cardiac testing including perioperative BB recommended. Prn low dose ativan for agitation. possibly needs SNF post op. -Discussed with orthopedic surgeon. Possible or later this evening or tomorrow morning.  Community acquired pneumonia Patient hypoxic to 80s on 10/6. CXR repeated shows RLL infiltrate. Placed on empiric rocephin and azithromycin. Needs swallow evaluation to rule out aspiration . Currently nothing by mouth for possible or this evening.  Severe dementia  Chronic macrocytic anemia Monitor h&H  abdominal hernia  reducible  LE edema  resume lasix post op   DVT prophylaxis  Diet: NPO  Code Status: full Family Communication: None at bedside today. Disposition Plan: may need SNF post op   Consultants: Ortho  Procedures:  For OR possibly today  Antibiotics:  none  HPI/Subjective: No overnight issues. O2 sat currently stable. Afebrile. Patient restless periodically due to pain.  Objective: Filed Vitals:   03/22/15 2257  BP: 131/47  Pulse: 76  Temp: 98.4 F (36.9 C)  Resp: 15    Intake/Output Summary (Last 24 hours) at 03/23/15 1313 Last data filed at 03/23/15 1034  Gross per 24 hour  Intake   1060 ml  Output   1250 ml  Net   -190 ml   Filed Weights   03/22/15 0000  Weight:  59.5 kg (131 lb 2.8 oz)    Exam:   General: elderly thin built male,.confused and restless  HEENT: conjunctival congestion, moist mucosa  Cardiovascular: NS1&S2, No murmurs  Respiratory: clear b/l  Abdomen: soft, NT, ND, BS+  Musculoskeletal: warm, limited mobility of rt hip  CNS: confused and restless  Data Reviewed: Basic Metabolic Panel:  Recent Labs Lab 03/21/15 2207 03/22/15 0500  NA 141 140  K 3.7 4.0  CL 106 105  CO2 34* 30  GLUCOSE 99 115*  BUN 29* 27*  CREATININE 1.15 1.03  CALCIUM 8.6* 8.6*   Liver Function Tests:  Recent Labs Lab 03/22/15 0500  AST 27  ALT 16*  ALKPHOS 45  BILITOT 0.9  PROT 5.8*  ALBUMIN 3.3*   No results for input(s): LIPASE, AMYLASE in the last 168 hours. No results for input(s): AMMONIA in the last 168 hours. CBC:  Recent Labs Lab 03/21/15 2207 03/22/15 0500 03/23/15 0435  WBC 9.7 11.4* 7.1  NEUTROABS 7.6 9.7*  --   HGB 10.0* 9.8* 10.5*  HCT 32.1* 30.8* 33.4*  MCV 105.2* 104.1* 102.8*  PLT 232 199 182   Cardiac Enzymes: No results for input(s): CKTOTAL, CKMB, CKMBINDEX, TROPONINI in the last 168 hours. BNP (last 3 results) No results for input(s): BNP in the last 8760 hours.  ProBNP (last 3 results) No results for input(s): PROBNP in the last 8760 hours.  CBG: No results for input(s): GLUCAP in the last 168 hours.  Recent Results (from the past 240 hour(s))  Surgical pcr screen     Status: None   Collection  Time: 03/22/15  7:54 AM  Result Value Ref Range Status   MRSA, PCR NEGATIVE NEGATIVE Final   Staphylococcus aureus NEGATIVE NEGATIVE Final    Comment:        The Xpert SA Assay (FDA approved for NASAL specimens in patients over 69 years of age), is one component of a comprehensive surveillance program.  Test performance has been validated by [facility] for patients greater than or equal to 39 year old. It is not intended to diagnose infection nor to guide or monitor treatment.       Studies: Dg Chest 1 View  03/21/2015   CLINICAL DATA:  Unwitnessed fall.  EXAM: CHEST 1 VIEW  COMPARISON:  11/06/2014  FINDINGS: The heart size and mediastinal contours are within normal limits. Both lungs are clear. The visualized skeletal structures are unremarkable.  IMPRESSION: No active disease.   Electronically Signed   By: Ellery Plunk M.D.   On: 03/21/2015 21:49   Dg Pelvis 1-2 Views  03/21/2015   CLINICAL DATA:  Status post fall. Concern for pelvic injury. Right thigh pain. Initial encounter.  EXAM: PELVIS - 1-2 VIEW  COMPARISON:  Abdominal radiographs performed 11/06/2014 and 12/23/2011  FINDINGS: There is new cortical irregularity at the right femoral neck, raising suspicion for a subcapital fracture of the right femoral neck. The right femoral head remains seated at the acetabulum.  Mild sclerotic change is noted at the sacroiliac joints. The left hip joint is unremarkable in appearance, with a small osseous density lateral to the joint space likely reflecting a small loose body. The visualized bowel gas pattern is grossly unremarkable.  IMPRESSION: New cortical irregularity of the right femoral neck, raising suspicion for a subcapital fracture of the right femoral neck.   Electronically Signed   By: Roanna Raider M.D.   On: 03/21/2015 21:49   Dg Chest Port 1 View  03/22/2015   CLINICAL DATA:  Hypoxia.  EXAM: PORTABLE CHEST 1 VIEW  COMPARISON:  03/21/2015  FINDINGS: Heart size is normal. There is patchy infiltrate in the right lung base consistent with infectious process. No edema. Left lung is clear.  IMPRESSION: Right lower lobe infiltrate.   Electronically Signed   By: Norva Pavlov M.D.   On: 03/22/2015 14:04   Dg Knee Complete 4 Views Right  03/21/2015   CLINICAL DATA:  Unwitnessed fall at home now with right knee and thigh pain.  EXAM: RIGHT KNEE - COMPLETE 4+ VIEW  COMPARISON:  Right femur radiographs - earlier same day  FINDINGS: The lateral radiograph is degraded due to  obliquity.  Osteopenia without definite displaced fracture. Mild degenerative change of the knee, most conspicuous within the medial compartment with joint space loss, subchondral sclerosis and osteophytosis. No evidence of chondrocalcinosis. No joint effusion or evidence of lipohemarthrosis. Adjacent vascular calcifications. No radiopaque foreign body.  IMPRESSION: Osteopenia without fracture or dislocation.   Electronically Signed   By: Simonne Come M.D.   On: 03/21/2015 21:49   Dg Femur, Min 2 Views Right  03/21/2015   CLINICAL DATA:  Fall  EXAM: RIGHT FEMUR 2 VIEWS  COMPARISON:  None.  FINDINGS: Subcapital femoral neck fracture with impaction is present. Osteopenia. Vascular calcifications are noted. No dislocation.  IMPRESSION: Acute subcapital right femoral neck fracture with impaction. Osteopenia.   Electronically Signed   By: Jolaine Click M.D.   On: 03/21/2015 21:49    Scheduled Meds: . azithromycin  500 mg Intravenous Q24H  .  ceFAZolin (ANCEF) IV  2 g  Intravenous To OR  . cefTRIAXone (ROCEPHIN)  IV  1 g Intravenous Q24H  . furosemide  20 mg Oral Daily  . LORazepam  0.5 mg Intravenous Once  . methocarbamol (ROBAXIN)  IV  500 mg Intravenous 3 times per day   Continuous Infusions:     Time spent: 25  minutes    Paislyn Domenico  [facility] Pager [phone] If 7PM-7AM, please contact night-coverage at [email or web address], password [facility] 03/23/2015, 1:13 PM  LOS: 1 day

## 2015-03-24 ENCOUNTER — Inpatient Hospital Stay (HOSPITAL_COMMUNITY): Payer: Medicare Other

## 2015-03-24 DIAGNOSIS — J181 Lobar pneumonia, unspecified organism: Secondary | ICD-10-CM

## 2015-03-24 DIAGNOSIS — R600 Localized edema: Secondary | ICD-10-CM

## 2015-03-24 DIAGNOSIS — W19XXXS Unspecified fall, sequela: Secondary | ICD-10-CM

## 2015-03-24 DIAGNOSIS — W19XXXA Unspecified fall, initial encounter: Secondary | ICD-10-CM | POA: Diagnosis present

## 2015-03-24 DIAGNOSIS — D539 Nutritional anemia, unspecified: Secondary | ICD-10-CM | POA: Diagnosis present

## 2015-03-24 DIAGNOSIS — S72001S Fracture of unspecified part of neck of right femur, sequela: Secondary | ICD-10-CM

## 2015-03-24 LAB — CBC
HEMATOCRIT: 31 % — AB (ref 39.0–52.0)
HEMOGLOBIN: 9.9 g/dL — AB (ref 13.0–17.0)
MCH: 33.1 pg (ref 26.0–34.0)
MCHC: 31.9 g/dL (ref 30.0–36.0)
MCV: 103.7 fL — AB (ref 78.0–100.0)
Platelets: 198 10*3/uL (ref 150–400)
RBC: 2.99 MIL/uL — AB (ref 4.22–5.81)
RDW: 12.7 % (ref 11.5–15.5)
WBC: 9.9 10*3/uL (ref 4.0–10.5)

## 2015-03-24 LAB — BASIC METABOLIC PANEL
ANION GAP: 8 (ref 5–15)
BUN: 14 mg/dL (ref 6–20)
CHLORIDE: 107 mmol/L (ref 101–111)
CO2: 29 mmol/L (ref 22–32)
Calcium: 8.6 mg/dL — ABNORMAL LOW (ref 8.9–10.3)
Creatinine, Ser: 0.99 mg/dL (ref 0.61–1.24)
GFR calc non Af Amer: >60 mL/min (ref >60)
Glucose, Bld: 124 mg/dL — ABNORMAL HIGH (ref 65–99)
POTASSIUM: 4.4 mmol/L (ref 3.5–5.1)
Sodium: 144 mmol/L (ref 135–145)

## 2015-03-24 MED ORDER — AZITHROMYCIN 500 MG PO TABS
500.0000 mg | ORAL_TABLET | ORAL | Status: DC
  Administered 2015-03-25: 500 mg via ORAL
  Filled 2015-03-24 (×4): qty 1

## 2015-03-24 MED ORDER — RESOURCE THICKENUP CLEAR PO POWD
ORAL | Status: DC | PRN
  Filled 2015-03-24: qty 125

## 2015-03-24 NOTE — Evaluation (Signed)
Clinical/Bedside Swallow Evaluation Patient Details  Name: Wayne Schroeder MRN: [id number] Date of Birth: 02-18-1925  Today's Date: 03/24/2015 Time: SLP Start Time (ACUTE ONLY): 1332 SLP Stop Time (ACUTE ONLY): 1357 SLP Time Calculation (min) (ACUTE ONLY): 25 min  Past Medical History:  Past Medical History  Diagnosis Date  . Dementia    Past Surgical History:  Past Surgical History  Procedure Laterality Date  . Back surgery     HPI:  81 yo male adm to [facility] with Closed right hip fx - pt is s/p surgery 10/7.  Pt has dementia but per family at baseline does not have dysphagia and feeds himself.  He reportedly does masticate pills.  Pt found to have right lobe infiltrate per CXR 10/6.       Assessment / Plan / Recommendation Clinical Impression  Pt's mental status impacts his swallowing ability currently.  He did not follow directions but demonstrated strong phonatory ability.  SLP raised HOB to approx 30* to conduct po trials.  Pt resistant to po but did accept tsps of thin and nectar juice.  Delayed swallow noted with throat clearing immediately post swallow of thin via tsp.  Nectar thick liquid tolerated without overt s/s of aspiration.  Recommend to modify diet to puree/thin with strict aspiration precautions.  SLP educated family to importance of providing pt with po only when fully alert, accepting and if tolerated.  Advised pt not to be fed if coughing with intake or sleepy.  Explained clinical reasoning for diet modification.  Will follow up for tolerance/advancement as indicated and famly education.  Thanks for this consult.      Aspiration Risk  Moderate    Diet Recommendation Dysphagia 1 (Puree);Nectar   Medication Administration: Crushed with puree (or via alternative means) Compensations: Slow rate;Small sips/bites;Check for pocketing    Other  Recommendations Oral Care Recommendations: Oral care before and after PO   Follow Up Recommendations    n/a   Frequency and  Duration min 2x/week  1 week   Pertinent Vitals/Pain Afebrile, decreased     Swallow Study Prior Functional Status   see hx    General Date of Onset: 03/24/15 Other Pertinent Information: 81 yo male adm to [facility] with Closed right hip fx - pt is s/p surgery 10/7.  Pt has dementia but per family at baseline does not have dysphagia and feeds himself.  He reportedly does masticate pills.  Pt found to have right lobe infiltrate per CXR 10/6.     Type of Study: Bedside swallow evaluation Diet Prior to this Study: Regular;Thin liquids Temperature Spikes Noted: No Respiratory Status: Supplemental O2 delivered via (comment) History of Recent Intubation: No Behavior/Cognition: Alert;Agitated;Confused;Doesn't follow directions;Fusing/Irritable Self-Feeding Abilities: Total assist Patient Positioning: Partially reclined (due to pain from fracture/surgery) Baseline Vocal Quality: Normal Volitional Cough: Cognitively unable to elicit Volitional Swallow: Unable to elicit    Oral/Motor/Sensory Function Overall Oral Motor/Sensory Function:  (pt with generalized weakness, does not follow commands and did not open his mouth adequately for evaluation)   Ice Chips Ice chips: Not tested   Thin Liquid Thin Liquid: Impaired Presentation: Spoon Oral Phase Impairments: Poor awareness of bolus Oral Phase Functional Implications: Prolonged oral transit;Oral holding Pharyngeal  Phase Impairments: Throat Clearing - Immediate    Nectar Thick Nectar Thick Liquid: Impaired Presentation: Spoon Oral Phase Impairments: Reduced lingual movement/coordination;Impaired anterior to posterior transit Oral phase functional implications: Oral holding;Prolonged oral transit   Honey Thick Honey Thick Liquid: Not tested   Puree Puree:  Impaired Presentation: Spoon Oral Phase Impairments: Reduced lingual movement/coordination;Impaired anterior to posterior transit;Poor awareness of bolus;Reduced labial seal (pt did not open  mouth adequately to accept pudding or mashed potatoes)   Solid   GO    Solid: Not tested       Wayne Burnet, MS [facility] SLP [phone]

## 2015-03-24 NOTE — Clinical Social Work Note (Signed)
Clinical Social Work Assessment  Patient Details  Name: Wayne Schroeder MRN: [id number] Date of Birth: 1924/10/02  Date of referral:  03/24/15               Reason for consult:  Facility Placement                Permission sought to share information with:  Facility Sport and exercise psychologist, Family Supports Permission granted to share information::  Yes, Verbal Permission Granted  Name::        Agency::     Relationship::     Contact Information:     Housing/Transportation Living arrangements for the past 2 months:  Single Family Home Source of Information:  Spouse (and daughter Lelan Pons) Patient Interpreter Needed:    Criminal Activity/Legal Involvement Pertinent to Current Situation/Hospitalization:    Significant Relationships:  Adult Children, Spouse Lives with:  Spouse Do you feel safe going back to the place where you live?    Need for family participation in patient care:  Yes (Comment)  Care giving concerns:  No concerns at this time    Facilities manager / plan:  CSW met with pt his wife and his daughter at bedside.  CSW provided explanation of role and SNF process.  CSW provided family with a list of SNF's that may accommodate pt at discharge.  CSW encouraged pt's family to explore thoughts and feelings regarding rehab and diagnoses.  CSW provided active and supportive listening.  CSW faxed pt information to SNF's in [place] area. CSW will continue to follow up with PT evaluation as well as family involvement regarding pt discharge needs  Employment status:  Retired Forensic scientist:  Medicare PT Recommendations:  Not assessed at this time Information / Referral to community resources:     Patient/Family's Response to care:  Pt's wife concerned with pt's health and agitation since his surgery.  Pt's wife and daughter discussed pt's agitation with the foley and the mittens that have been placed on pt's hands.  Pt's family hopeful that pt will become calm  with medication that has been added.  Pt open to hearing which SNF's can accommodate pt at discharge if needed.   Patient/Family's Understanding of and Emotional Response to Diagnosis, Current Treatment, and Prognosis:  Pt's family is concerned with pt's agitation.  Pt's wife grieving for pt's condition.  Pt's daughter hoping pt will be able to participate in rehab  Emotional Assessment Appearance:  Appears stated age Attitude/Demeanor/Rapport:  Other (anxious) Affect (typically observed):  Agitated Orientation:  Oriented to Self Alcohol / Substance use:    Psych involvement (Current and /or in the community):     Discharge Needs  Concerns to be addressed:    Readmission within the last 30 days:    Current discharge risk:    Barriers to Discharge:  No Barriers Identified   Carlean Jews, LCSW 03/24/2015, 4:14 PM

## 2015-03-24 NOTE — Progress Notes (Signed)
Swelling noted to right sided pubic area approximately one hour after foley pulled; firm to palpation, MD paged

## 2015-03-24 NOTE — Progress Notes (Signed)
Patient ID: Wayne Schroeder, male   DOB: 1924/12/21, 81 y.o.   MRN: [id number] [facility] PROGRESS NOTE  KAMILO OCH WUJ:[id number] DOB: 01-May-1925 DOA: 03/21/2015 PCP: Martha Clan, MD  Brief narrative:    81 year old male with past medical history of severe dementia, lower extremity edema (on Lasix) who presented to [facility] ED 10/5 status post fall. He sustained right femoral neck fracture. Pt underwent right hip hemiarthroplasty. Pt developed pneumonia 1 day after the admission. CXR on 10/6 showed right lower lobe pneumonia. He was started on azithromycin and rocephin.   Anticipated discharge: to SNF likely by Monday 10/10.  Assessment/Plan:    Principal Problem: Displaced right hip fracture - S/p mecahnical fall.  - S/P right hip hemiarthroplasty - PT evaluation pending. - Continue supportive care with analgesia as needed   Active Problems: Lobar pneumonia, right lower lobe - CXR on 10/6 showed right lower lobe pneumonia - Started on rocephin and azithromycin 10/6 - Stable respiratory status   Severe dementia - Stable  Macrocytic anemia - Hemoglobin stable   LE edema - Continue lasix 20 mg daily   DVT Prophylaxis  - Lovenox subQ ordered    Code Status: Full.  Family Communication:  Family not at the bedside this am Disposition Plan: Obtain PT evaluation  IV access:  Peripheral IV  Procedures and diagnostic studies:    Dg Chest 1 View 03/21/2015  No active disease.     Dg Pelvis 1-2 Views 03/21/2015  New cortical irregularity of the right femoral neck, raising suspicion for a subcapital fracture of the right femoral neck.     Pelvis Portable 03/23/2015 Good appearance following bipolar hemiarthroplasty on the right.     Dg Chest Port 1 View 03/22/2015  Right lower lobe infiltrate.   Electronically Signed   By: Norva Pavlov M.D.   On: 03/22/2015 14:04   Dg Knee Complete 4 Views Right 03/21/2015  Osteopenia without fracture or dislocation.   Electronically  Signed   By: Simonne Come M.D.   On: 03/21/2015 21:49   Dg Hip Operative Unilat With Pelvis Right 03/23/2015  Right bipolar hemiarthroplasty.   Electronically Signed   By: Paulina Fusi M.D.   On: 03/23/2015 20:17   Dg Femur, Min 2 Views Right 03/21/2015  Acute subcapital right femoral neck fracture with impaction. Osteopenia.   Electronically Signed   By: Jolaine Click M.D.   On: 03/21/2015 21:49   Right hip hemiarthroplasty 03/23/2015   Medical Consultants:  Orthopedic surgery  Other Consultants:  Physical therapy SLP  IAnti-Infectives:   Azithromycin 03/22/2015 --> Rocephin 03/22/2015 -->  Pre op cefazolin   Manson Passey, MD  [facility] Pager [phone]  Time spent in minutes: 25 minutes  If 7PM-7AM, please contact night-coverage [email or web address] Password [email or web address] 03/24/2015, 3:06 PM   LOS: 2 days    HPI/Subjective: No acute overnight events. No respiratory distress.  Objective: Filed Vitals:   03/23/15 2331 03/24/15 0032 03/24/15 0622 03/24/15 1011  BP: 147/72 120/50 134/62 128/56  Pulse: 96 79 88 76  Temp: 97.9 F (36.6 C) 98.9 F (37.2 C) 97.8 F (36.6 C) 98.3 F (36.8 C)  TempSrc: Axillary Axillary Axillary Axillary  Resp: Height:      Weight:      SpO2: 100% 94% 100% 100%    Intake/Output Summary (Last 24 hours) at 03/24/15 1506 Last data filed at 03/24/15 1341  Gross per 24 hour  Intake 2579.16 ml  Output   1510  ml  Net 1069.16 ml    Exam:   General:  Pt is alert, not in acute distress  Cardiovascular: Regular rate and rhythm, S1/S2 (+)  Respiratory: Clear to auscultation bilaterally, no wheezing, no crackles, no rhonchi  Abdomen: Soft, non tender, non distended, bowel sounds present  Extremities: pulses DP and PT palpable bilaterally  Neuro: Grossly nonfocal  Data Reviewed: Basic Metabolic Panel:  Recent Labs Lab 03/21/15 2207 03/22/15 0500 03/23/15 2215 03/24/15 0440  NA 141 140  --  144  K 3.7 4.0  --  4.4  CL 106  105  --  107  CO2 34* 30  --  29  GLUCOSE 99 115*  --  124*  BUN 29* 27*  --  14  CREATININE 1.15 1.03 0.96 0.99  CALCIUM 8.6* 8.6*  --  8.6*   Liver Function Tests:  Recent Labs Lab 03/22/15 0500  AST 27  ALT 16*  ALKPHOS 45  BILITOT 0.9  PROT 5.8*  ALBUMIN 3.3*   No results for input(s): LIPASE, AMYLASE in the last 168 hours. No results for input(s): AMMONIA in the last 168 hours. CBC:  Recent Labs Lab 03/21/15 2207 03/22/15 0500 03/23/15 0435 03/23/15 2215 03/24/15 0440  WBC 9.7 11.4* 7.1 10.0 9.9  NEUTROABS 7.6 9.7*  --   --   --   HGB 10.0* 9.8* 10.5* 10.2* 9.9*  HCT 32.1* 30.8* 33.4* 31.6* 31.0*  MCV 105.2* 104.1* 102.8* 102.6* 103.7*  PLT 232 199 182 187 198   Cardiac Enzymes: No results for input(s): CKTOTAL, CKMB, CKMBINDEX, TROPONINI in the last 168 hours. BNP: Invalid input(s): POCBNP CBG: No results for input(s): GLUCAP in the last 168 hours.  Recent Results (from the past 240 hour(s))  Surgical pcr screen     Status: None   Collection Time: 03/22/15  7:54 AM  Result Value Ref Range Status   MRSA, PCR NEGATIVE NEGATIVE Final   Staphylococcus aureus NEGATIVE NEGATIVE Final     Scheduled Meds: . azithromycin  500 mg Oral Q24H  . cefTRIAXone (ROCEPHIN)  IV  1 g Intravenous Q24H  . enoxaparin (LOVENOX) injection  40 mg Subcutaneous Q24H  . furosemide  20 mg Oral Daily   Continuous Infusions: . sodium chloride 50 mL/hr at 03/23/15 2344

## 2015-03-24 NOTE — Progress Notes (Signed)
Spoke with Attending Dr Elisabeth Pigeon. Telephone order received for U/S STAT

## 2015-03-24 NOTE — Evaluation (Signed)
Physical Therapy Evaluation Patient Details Name: Wayne Schroeder MRN: [id number] DOB: 1924-11-26 Today's Date: 03/24/2015   History of Present Illness  patient is 81 yo male with history of dementiaa, fell at home on 10/5. Sustained femoral neck fracture. S/P Hip hemiarthroplasty 10/7.  Clinical Impression  Patient was intent on removing mittens, was distracted enough with this and able to assist to EOB. Patient will benefit from PT  To mobilize and ambulate if able to participate to address problems listed in note below.    Follow Up Recommendations Supervision/Assistance - 24 hour    Equipment Recommendations  None recommended by PT    Recommendations for Other Services       Precautions / Restrictions Precautions Precautions: Fall Precaution Comments: dementia Restrictions Weight Bearing Restrictions: No RLE Weight Bearing: Weight bearing as tolerated      Mobility  Bed Mobility Overal bed mobility: Needs Assistance;+2 for physical assistance;+ 2 for safety/equipment Bed Mobility: Supine to Sit;Sit to Supine     Supine to sit: Total assist;+2 for physical assistance;+2 for safety/equipment Sit to supine: Total assist;+2 for physical assistance   General bed mobility comments: gingerly able to slide patient to the edge of the bed with pad and returned to  bed, patient maintained long sitting after legs placed  Transfers                 General transfer comment: NT , too restless  Ambulation/Gait                Stairs            Wheelchair Mobility    Modified Rankin (Stroke Patients Only)       Balance Overall balance assessment: History of Falls;Needs assistance Sitting-balance support: Feet supported;No upper extremity supported Sitting balance-Leahy Scale: Fair Sitting balance - Comments: sat at midline x 10 minutes.                                     Pertinent Vitals/Pain Pain Assessment: Faces Faces Pain Scale:  Hurts a little bit Pain Location: R leg, not specific Pain Descriptors / Indicators: Grimacing    Home Living Family/patient expects to be discharged to:: Skilled nursing facility Living Arrangements: Spouse/significant other                    Prior Function Level of Independence: Independent         Comments: ambulatory  in home     Hand Dominance        Extremity/Trunk Assessment   Upper Extremity Assessment: Generalized weakness           Lower Extremity Assessment: Generalized weakness;RLE deficits/detail RLE Deficits / Details: does move the leg in bed    Cervical / Trunk Assessment: Kyphotic  Communication      Cognition Arousal/Alertness: Awake/alert Behavior During Therapy: Agitated;Restless Overall Cognitive Status: History of cognitive impairments - at baseline                      General Comments      Exercises        Assessment/Plan    PT Assessment Patient needs continued PT services  PT Diagnosis Difficulty walking;Altered mental status   PT Problem List Decreased strength;Decreased range of motion;Decreased activity tolerance;Decreased balance;Decreased mobility;Decreased cognition;Decreased knowledge of use of DME;Decreased safety awareness;Decreased knowledge of precautions  PT Treatment Interventions DME  instruction;Gait training;Functional mobility training;Therapeutic activities;Therapeutic exercise;Balance training;Patient/family education   PT Goals (Current goals can be found in the Care Plan section) Acute Rehab PT Goals Patient Stated Goal: wants him to walk eventually PT Goal Formulation: With family Time For Goal Achievement: 04/07/15 Potential to Achieve Goals: Fair    Frequency Min 3X/week   Barriers to discharge        Co-evaluation               End of Session   Activity Tolerance: Patient tolerated treatment well Patient left: in bed;with call bell/phone within reach;with bed alarm  set;with family/visitor present Nurse Communication: Mobility status         Time: 6213-0865 PT Time Calculation (min) (ACUTE ONLY): 17 min   Charges:   PT Evaluation $Initial PT Evaluation Tier I: 1 Procedure     PT G CodesRada Hay 03/24/2015, 4:42 PM

## 2015-03-24 NOTE — Progress Notes (Signed)
   Subjective:  Pt demented. No events.  Objective:   VITALS:   Filed Vitals:   03/23/15 2230 03/23/15 2331 03/24/15 0032 03/24/15 0622  BP: 135/70 147/72 120/50 134/62  Pulse: 91 96 79 88  Temp: 97.7 F (36.5 C) 97.9 F (36.6 C) 98.9 F (37.2 C) 97.8 F (36.6 C)  TempSrc: Axillary Axillary Axillary Axillary  Resp: Height:      Weight:      SpO2: 100% 100% 94% 100%    ABD soft Intact pulses distally Dorsiflexion/Plantar flexion intact Incision: dressing C/D/I Compartment soft   Lab Results  Component Value Date   WBC 9.9 03/24/2015   HGB 9.9* 03/24/2015   HCT 31.0* 03/24/2015   MCV 103.7* 03/24/2015   PLT 198 03/24/2015   BMET    Component Value Date/Time   NA 144 03/24/2015 0440   K 4.4 03/24/2015 0440   CL 107 03/24/2015 0440   CO2 29 03/24/2015 0440   GLUCOSE 124* 03/24/2015 0440   BUN 14 03/24/2015 0440   CREATININE 0.99 03/24/2015 0440   CALCIUM 8.6* 03/24/2015 0440   GFRNONAA >60 03/24/2015 0440   GFRAA >60 03/24/2015 0440     Assessment/Plan: 1 Day Post-Op   Active Problems:   Closed right hip fracture (HCC)   Dementia   Chronic anemia   Hip fracture (HCC)   Hip fracture, right (HCC)   Femoral neck fracture, right, closed, initial encounter   WBAT with walker PO pain control lovenox for DVT ppx PT/OT SNF placement   Hennessey Cantrell, Wayne Schroeder 03/24/2015, 8:24 AM   Samson Frederic, MD Cell [phone]

## 2015-03-24 NOTE — Anesthesia Postprocedure Evaluation (Signed)
  Anesthesia Post-op Note  Patient: Wayne Schroeder  Procedure(s) Performed: Procedure(s) (LRB): RIGHT HIP ANTERIOR APPROACH HEMI ARTHROPLASTY (Right)  Patient Location: PACU  Anesthesia Type: General  Level of Consciousness: awake and alert   Airway and Oxygen Therapy: Patient Spontanous Breathing  Post-op Pain: mild  Post-op Assessment: Post-op Vital signs reviewed, Patient's Cardiovascular Status Stable, Respiratory Function Stable, Patent Airway and No signs of Nausea or vomiting  Last Vitals:  Filed Vitals:   03/24/15 2112  BP: 140/73  Pulse: 94  Temp: 37.4 C  Resp: 22    Post-op Vital Signs: stable   Complications: No apparent anesthesia complications

## 2015-03-24 NOTE — Progress Notes (Signed)
PHARMACIST - PHYSICIAN COMMUNICATION DR:   Elisabeth Pigeon CONCERNING: Antibiotic IV to Oral Route Change Policy  RECOMMENDATION: This patient is receiving azithromycin by the intravenous route.  Based on criteria approved by the Pharmacy and Therapeutics Committee, the antibiotic(s) is/are being converted to the equivalent oral dose form(s).   DESCRIPTION: These criteria include:  Patient being treated for a respiratory tract infection, urinary tract infection, cellulitis or clostridium difficile associated diarrhea if on metronidazole  The patient is not neutropenic and does not exhibit a GI malabsorption state  The patient is eating (either orally or via tube) and/or has been taking other orally administered medications for a least 24 hours  The patient is improving clinically and has a Tmax < 100.5  If you have questions about this conversion, please contact the Pharmacy Department    [phone] )  [facility]   [phone] )  [facility]   [phone] )  [facility]   [phone] )  [facility]   [phone] )  [facility]   Dorna Leitz, PharmD, BCPS 03/24/2015 11:17 AM

## 2015-03-25 DIAGNOSIS — N179 Acute kidney failure, unspecified: Secondary | ICD-10-CM

## 2015-03-25 DIAGNOSIS — D72829 Elevated white blood cell count, unspecified: Secondary | ICD-10-CM | POA: Diagnosis present

## 2015-03-25 LAB — BASIC METABOLIC PANEL
Anion gap: 9 (ref 5–15)
BUN: 23 mg/dL — AB (ref 6–20)
CALCIUM: 8.5 mg/dL — AB (ref 8.9–10.3)
CHLORIDE: 109 mmol/L (ref 101–111)
CO2: 25 mmol/L (ref 22–32)
CREATININE: 1.94 mg/dL — AB (ref 0.61–1.24)
GFR, EST AFRICAN AMERICAN: 34 mL/min — AB (ref >60)
GFR, EST NON AFRICAN AMERICAN: 29 mL/min — AB (ref >60)
Glucose, Bld: 111 mg/dL — ABNORMAL HIGH (ref 65–99)
Potassium: 3.8 mmol/L (ref 3.5–5.1)
SODIUM: 143 mmol/L (ref 135–145)

## 2015-03-25 LAB — CBC
HCT: 31.4 % — ABNORMAL LOW (ref 39.0–52.0)
HEMOGLOBIN: 10.3 g/dL — AB (ref 13.0–17.0)
MCH: 33 pg (ref 26.0–34.0)
MCHC: 32.8 g/dL (ref 30.0–36.0)
MCV: 100.6 fL — ABNORMAL HIGH (ref 78.0–100.0)
PLATELETS: 202 10*3/uL (ref 150–400)
RBC: 3.12 MIL/uL — ABNORMAL LOW (ref 4.22–5.81)
RDW: 12.9 % (ref 11.5–15.5)
WBC: 10.7 10*3/uL — ABNORMAL HIGH (ref 4.0–10.5)

## 2015-03-25 MED ORDER — SODIUM CHLORIDE 0.9 % IV SOLN
INTRAVENOUS | Status: DC
  Administered 2015-03-25: 11:00:00 via INTRAVENOUS

## 2015-03-25 NOTE — Care Management Important Message (Signed)
Important Message  Patient Details  Name: Wayne Schroeder MRN: [id number] Date of Birth: 1925/06/11   Medicare Important Message Given:  Yes-second notification given    Elliot Cousin, RN 03/25/2015, 5:13 PM

## 2015-03-25 NOTE — Progress Notes (Signed)
Occupational Therapy Evaluation and Discharged Patient Details Name: Wayne Schroeder MRN: [id number] DOB: 12-28-1924 Today's Date: 03/25/2015    History of Present Illness patient is 81 yo male with history of dementiaa, fell at home on 10/5. Sustained femoral neck fracture. S/P Hip hemiarthroplasty 10/7.   Clinical Impression   This 81 yo male admitted and underwent above presents to acute OT with decreased balance/mobility, decreased cognition (pre-existing) all affecting his ability to help care for himself at home. He will benefit from follow up at SNF with hopeful return home. Acute OT will sign off.    Follow Up Recommendations  SNF    Equipment Recommendations   (TBD at next venue)       Precautions / Restrictions Precautions Precautions: Fall (per op note a direct anterior approach was performed) Precaution Comments: dementia Restrictions Weight Bearing Restrictions: No RLE Weight Bearing: Weight bearing as tolerated      Mobility Bed Mobility Overal bed mobility: Needs Assistance;+2 for physical assistance Bed Mobility: Supine to Sit;Sit to Supine     Supine to sit: Total assist;+2 for physical assistance Sit to supine: Total assist;+2 for physical assistance   General bed mobility comments: Used bed pad to help get pt to EOB  Transfers Overall transfer level: Needs assistance Equipment used: Rolling walker (2 wheeled) Transfers: Sit to/from Stand Sit to Stand: Mod assist;+2 physical assistance              Balance Overall balance assessment: History of Falls;Needs assistance Sitting-balance support: Feet supported;Bilateral upper extremity supported Sitting balance-Leahy Scale: Poor Sitting balance - Comments: tendency for posterior lean today   Standing balance support: Bilateral upper extremity supported Standing balance-Leahy Scale: Poor Standing balance comment: initial posterior lean                            ADL Overall ADL's  : Needs assistance/impaired   Eating/Feeding Details (indicate cue type and reason): Staff feeding him here Grooming: Wash/dry face;Wash/dry hands;Minimal assistance;Bed level     Upper Body Bathing Details (indicate cue type and reason): normally has A for this at home   Lower Body Bathing Details (indicate cue type and reason): normally has A for this at home   Upper Body Dressing Details (indicate cue type and reason): normally has A for this at home   Lower Body Dressing Details (indicate cue type and reason): normally has A for this at home Toilet Transfer: +2 for physical assistance;Minimal assistance;Ambulation;RW Toilet Transfer Details (indicate cue type and reason): progressed to +2 for safety/equipment Toileting- Clothing Manipulation and Hygiene: Total assistance (with total A +2 mod A sit<>stand)               Vision Additional Comments: unknown prior level          Pertinent Vitals/Pain Pain Assessment: Faces Faces Pain Scale: Hurts a little bit Pain Location: points to his right leg at groin area after ambulation Pain Intervention(s): Limited activity within patient's tolerance;Repositioned        Extremity/Trunk Assessment Upper Extremity Assessment Upper Extremity Assessment: Generalized weakness              Cognition Arousal/Alertness: Awake/alert Behavior During Therapy: Flat affect (agitated a couple of times) Overall Cognitive Status: History of cognitive impairments - at baseline  Home Living Family/patient expects to be discharged to:: Skilled nursing facility                                             OT Diagnosis: Generalized weakness;Cognitive deficits   OT Problem List: Decreased strength;Decreased range of motion;Impaired balance (sitting and/or standing);Decreased cognition      OT Goals(Current goals can be found in the care plan section) Acute Rehab OT  Goals Patient Stated Goal: pt unable to state and no family present  OT Frequency:             Co-evaluation PT/OT/SLP Co-Evaluation/Treatment: Yes Reason for Co-Treatment: For patient/therapist safety   OT goals addressed during session: ADL's and self-care;Strengthening/ROM      End of Session Equipment Utilized During Treatment: Gait belt;Rolling walker Nurse Communication:  (NT saw pt ambulating in hallway with [place])  Activity Tolerance: Patient tolerated treatment well Patient left: in bed;with bed alarm set   Time: 1005-1023 OT Time Calculation (min): 18 min Charges:  OT General Charges $OT Visit: 1 Procedure OT Evaluation $Initial OT Evaluation Tier I: 1 Procedure  Evette Georges [phone] 03/25/2015, 11:04 AM

## 2015-03-25 NOTE — Care Management Note (Signed)
Case Management Note  Patient Details  Name: AXZEL ROCKHILL MRN: [id number] Date of Birth: [birth year]  Subjective/Objective:     right hip hemiarthroplasty 03/23/2015                Action/Plan: SNF  Expected Discharge Date:  03/24/15               Expected Discharge Plan:  Skilled Nursing Facility  In-House Referral:  Clinical Social Work  Discharge planning Services  CM Consult   Status of Service:  Completed, signed off  Medicare Important Message Given:  Yes-second notification given Date Medicare IM Given:    Medicare IM give by:    Date Additional Medicare IM Given:    Additional Medicare Important Message give by:     If discussed at Long Length of Stay Meetings, dates discussed:    Additional Comments: Scheduled dc to SNF.  Elliot Cousin, RN 03/25/2015, 5:15 PM

## 2015-03-25 NOTE — Clinical Social Work Placement (Signed)
   CLINICAL SOCIAL WORK PLACEMENT  NOTE  Date:  03/25/2015  Patient Details  Name: Wayne Schroeder MRN: [id number] Date of Birth: 1924/07/01  Clinical Social Work is seeking post-discharge placement for this patient at the Skilled  Nursing Facility level of care (*CSW will initial, date and re-position this form in  chart as items are completed):  Yes   Patient/family provided with [facility] Clinical Social Work Department's list of facilities offering this level of care within the geographic area requested by the patient (or if unable, by the patient's family).  Yes   Patient/family informed of their freedom to choose among providers that offer the needed level of care, that participate in Medicare, Medicaid or managed care program needed by the patient, have an available bed and are willing to accept the patient.  Yes   Patient/family informed of [facility]'s ownership interest in [facility] and [facility], as well as of the fact that they are under no obligation to receive care at these facilities.  PASRR submitted to EDS on 03/24/15     PASRR number received on 03/24/15     Existing PASRR number confirmed on       FL2 transmitted to all facilities in geographic area requested by pt/family on 03/24/15     FL2 transmitted to all facilities within larger geographic area on       Patient informed that his/her managed care company has contracts with or will negotiate with certain facilities, including the following:          yes  Patient/family informed of bed offers received.  Patient chooses bed at       Physician recommends and patient chooses bed at      Patient to be transferred to   on  .  Patient to be transferred to facility by       Patient family notified on   of transfer.  Name of family member notified:        PHYSICIAN       Additional Comment:    _______________________________________________ Annetta Maw, LCSW 03/25/2015, 1:54 PM

## 2015-03-25 NOTE — Clinical Social Work Note (Signed)
CSW spoke with pt's daughter who toured SNF's today and has chosen [facility] as first choice for pt SNF CSW sent [facility] a message with recent clinicals that pt has chosen them for his rehab needs.  Pt's second choice is [facility].  Elray Buba, LCSW [facility] Clinical Social Worker - Weekend Coverage cell #: [phone]

## 2015-03-25 NOTE — Progress Notes (Addendum)
Patient ID: Wayne Schroeder, male   DOB: 04/02/1925, 81 y.o.   MRN: [id number] [facility] PROGRESS NOTE  KRISTON PASQUARELLO JYN:[id number] DOB: Nov 30, 1924 DOA: 03/21/2015 PCP: Martha Clan, MD  Brief narrative:    81 year old male with past medical history of severe dementia, lower extremity edema (on Lasix) who presented to [facility] ED 10/5 status post fall. He sustained right femoral neck fracture. Pt underwent right hip hemiarthroplasty. Patient developed pneumonia 1 day after the admission. CXR on 10/6 showed right lower lobe pneumonia. Patient was started on azithromycin and rocephin.   Anticipated discharge: to SNF 10/10.  Assessment/Plan:    Principal Problem: Displaced right hip fracture - Found to have right hip fracture; underwent right hip hemiarthroplasty 03/23/2015  - No subsequent complications  - PT has seen the pt in consultation, recommends SNF - Appreciate SW assistance with discharge plan   Active Problems: Lobar pneumonia, right lower lobe / Leukocytosis  - Right lower lobe pneumonia seen on CXR on 10/6 - We will continue azithromycin and rocephin    Severe dementia - Stable - No behavioral disturbance   Macrocytic anemia - Hemoglobin stable at 10.3 - No reports of bleeding   LE edema - Hold of on lasix due to acute renal failure   Acute renal failure - Likely due to lasix - Lasix placed on hold today - Follow up renal function in am  DVT Prophylaxis  - Lovenox subQ ordered    Code Status: Full.  Family Communication:  Family not at the bedside  Disposition Plan: Likely to SNF 10/10.  IV access:  Peripheral IV  Procedures and diagnostic studies:    Dg Chest 1 View 03/21/2015  No active disease.     Dg Pelvis 1-2 Views 03/21/2015  New cortical irregularity of the right femoral neck, raising suspicion for a subcapital fracture of the right femoral neck.     Pelvis Portable 03/23/2015 Good appearance following bipolar hemiarthroplasty on the right.      Dg Chest Port 1 View 03/22/2015  Right lower lobe infiltrate.   Electronically Signed   By: Norva Pavlov M.D.   On: 03/22/2015 14:04   Dg Knee Complete 4 Views Right 03/21/2015  Osteopenia without fracture or dislocation.   Electronically Signed   By: Simonne Come M.D.   On: 03/21/2015 21:49   Dg Hip Operative Unilat With Pelvis Right 03/23/2015  Right bipolar hemiarthroplasty.   Electronically Signed   By: Paulina Fusi M.D.   On: 03/23/2015 20:17   Dg Femur, Min 2 Views Right 03/21/2015  Acute subcapital right femoral neck fracture with impaction. Osteopenia.   Electronically Signed   By: Jolaine Click M.D.   On: 03/21/2015 21:49   Right hip hemiarthroplasty 03/23/2015   Medical Consultants:  Orthopedic surgery  Other Consultants:  Physical therapy SLP  IAnti-Infectives:   Azithromycin 03/22/2015 --> Rocephin 03/22/2015 -->  Pre op cefazolin   Manson Passey, MD  [facility] Pager [phone]  Time spent in minutes: 15 minutes  If 7PM-7AM, please contact night-coverage [email or web address] Password [email or web address] 03/25/2015, 11:16 AM   LOS: 3 days    HPI/Subjective: No acute overnight events. No reports of pain.   Objective: Filed Vitals:   03/24/15 1507 03/24/15 1720 03/24/15 2112 03/25/15 0605  BP: 136/58 153/79 140/73 144/76  Pulse: 86 110 94 87  Temp: 98.2 F (36.8 C)  99.3 F (37.4 C) 98.7 F (37.1 C)  TempSrc: Axillary  Axillary Axillary  Resp: 20  Height:      Weight:      SpO2: 100% 100% 100% 100%    Intake/Output Summary (Last 24 hours) at 03/25/15 1116 Last data filed at 03/25/15 0607  Gross per 24 hour  Intake 1174.17 ml  Output    850 ml  Net 324.17 ml    Exam:   General:  Pt is not in acute distress   Cardiovascular: Rate controlled, (+) S1, S2   Respiratory: No wheezing, no crackles, no rhonchi  Abdomen: (+) BS, non tender, non distended   Extremities: pulses palpable, no tenderness   Neuro: Nonfocal  Data Reviewed: Basic  Metabolic Panel:  Recent Labs Lab 03/21/15 2207 03/22/15 0500 03/23/15 2215 03/24/15 0440 03/25/15 0435  NA 141 140  --  144 143  K 3.7 4.0  --  4.4 3.8  CL 106 105  --  107 109  CO2 34* 30  --  29 25  GLUCOSE 99 115*  --  124* 111*  BUN 29* 27*  --  14 23*  CREATININE 1.15 1.03 0.96 0.99 1.94*  CALCIUM 8.6* 8.6*  --  8.6* 8.5*   Liver Function Tests:  Recent Labs Lab 03/22/15 0500  AST 27  ALT 16*  ALKPHOS 45  BILITOT 0.9  PROT 5.8*  ALBUMIN 3.3*   No results for input(s): LIPASE, AMYLASE in the last 168 hours. No results for input(s): AMMONIA in the last 168 hours. CBC:  Recent Labs Lab 03/21/15 2207 03/22/15 0500 03/23/15 0435 03/23/15 2215 03/24/15 0440 03/25/15 0435  WBC 9.7 11.4* 7.1 10.0 9.9 10.7*  NEUTROABS 7.6 9.7*  --   --   --   --   HGB 10.0* 9.8* 10.5* 10.2* 9.9* 10.3*  HCT 32.1* 30.8* 33.4* 31.6* 31.0* 31.4*  MCV 105.2* 104.1* 102.8* 102.6* 103.7* 100.6*  PLT 232 199 182 187 198 202   Cardiac Enzymes: No results for input(s): CKTOTAL, CKMB, CKMBINDEX, TROPONINI in the last 168 hours. BNP: Invalid input(s): POCBNP CBG: No results for input(s): GLUCAP in the last 168 hours.  Recent Results (from the past 240 hour(s))  Surgical pcr screen     Status: None   Collection Time: 03/22/15  7:54 AM  Result Value Ref Range Status   MRSA, PCR NEGATIVE NEGATIVE Final   Staphylococcus aureus NEGATIVE NEGATIVE Final     Scheduled Meds: . azithromycin  500 mg Oral Q24H  . cefTRIAXone (ROCEPHIN)  IV  1 g Intravenous Q24H  . enoxaparin (LOVENOX) injection  40 mg Subcutaneous Q24H  . furosemide  20 mg Oral Daily   Continuous Infusions: . sodium chloride 50 mL/hr at 03/25/15 1039

## 2015-03-25 NOTE — Clinical Social Work Note (Signed)
CSW met with pt, his wife, daughter and son in law at bedside to present SNF bed offers. CSW explained SNF process, insurance and transportation options to family.  Pt's daughter and son in law are going to tour facilities this afternoon and make a decision.  Per MD note pt may discharge tomorrow.  Pt's son in law wants to be contacted in the morning regarding SNF bed decision and pt discharge.  His name is Steve [phone].  He is not on face sheet.   , LCSW [facility] Clinical Social Worker - Weekend Coverage cell #: [phone] 

## 2015-03-25 NOTE — Progress Notes (Signed)
Physical Therapy Treatment Patient Details Name: Wayne Schroeder MRN: [id number] DOB: Dec 11, 1924 Today's Date: 03/25/2015    History of Present Illness patient is 81 yo male with history of dementiaa, fell at home on 10/5. Sustained femoral neck fracture. S/P Hip hemiarthroplasty from direct anterior approach 10/7.    PT Comments    Patient was much less restless, able to ambulate  A 10' with RW. Does not follow but tactile cues used for mobility with patient not being resistive. Did catch his IV line but able to remove it from grip. Mittens replaced.  Follow Up Recommendations  Supervision/Assistance - 24 hour;SNF     Equipment Recommendations  None recommended by PT    Recommendations for Other Services       Precautions / Restrictions Precautions Precautions: Fall Precaution Comments: dementia, pulls at things, incontinent Restrictions Weight Bearing Restrictions: No RLE Weight Bearing: Weight bearing as tolerated    Mobility  Bed Mobility Overal bed mobility: Needs Assistance;+2 for physical assistance Bed Mobility: Supine to Sit;Sit to Supine     Supine to sit: Total assist;+2 for physical assistance Sit to supine: Total assist;+2 for physical assistance   General bed mobility comments: Used bed pad to help get pt to EOB, assuist with trunk and legs onto bed  Transfers Overall transfer level: Needs assistance Equipment used: Rolling walker (2 wheeled) Transfers: Sit to/from Stand Sit to Stand: Mod assist;+2 physical assistance            Ambulation/Gait Ambulation/Gait assistance: Mod assist;+2 safety/equipment Ambulation Distance (Feet): 50 Feet Assistive device: Rolling walker (2 wheeled) Gait Pattern/deviations: Step-to pattern;Step-through pattern;Decreased step length - right;Decreased stance time - right     General Gait Details: patient was able to initiate ambulation with RW, able to bear weaight and swing the R leg,   Stairs             Wheelchair Mobility    Modified Rankin (Stroke Patients Only)       Balance Overall balance assessment: History of Falls;Needs assistance Sitting-balance support: Feet supported;Bilateral upper extremity supported Sitting balance-Leahy Scale: Poor Sitting balance - Comments: tendency for posterior lean today   Standing balance support: Bilateral upper extremity supported Standing balance-Leahy Scale: Poor Standing balance comment: initial posterior lean                    Cognition Arousal/Alertness: Awake/alert Behavior During Therapy: WFL for tasks assessed/performed Overall Cognitive Status: History of cognitive impairments - at baseline                      Exercises      General Comments        Pertinent Vitals/Pain Pain Assessment: Faces Faces Pain Scale: Hurts a little bit Pain Location: points to his R leg and groin and attempts to say something, not sensible. Pain Descriptors / Indicators: Grimacing Pain Intervention(s): Limited activity within patient's tolerance    Home Living Family/patient expects to be discharged to:: Skilled nursing facility                    Prior Function            PT Goals (current goals can now be found in the care plan section) Acute Rehab PT Goals Patient Stated Goal: pt unable to state and no family present Progress towards PT goals: Progressing toward goals    Frequency  Min 3X/week    PT Plan Current plan remains appropriate  Co-evaluation PT/OT/SLP Co-Evaluation/Treatment: Yes Reason for Co-Treatment: For patient/therapist safety PT goals addressed during session: Mobility/safety with mobility OT goals addressed during session: ADL's and self-care;Strengthening/ROM     End of Session Equipment Utilized During Treatment: Gait belt Activity Tolerance: Patient tolerated treatment well Patient left: in bed;with call bell/phone within reach;with bed alarm set;with family/visitor  present     Time: 4098-1191 PT Time Calculation (min) (ACUTE ONLY): 18 min  Charges:  $Gait Training: 8-22 mins                    G Codes:      Rada Hay 03/25/2015, 1:10 PM

## 2015-03-26 LAB — CBC
HEMATOCRIT: 31 % — AB (ref 39.0–52.0)
Hemoglobin: 10.2 g/dL — ABNORMAL LOW (ref 13.0–17.0)
MCH: 33.4 pg (ref 26.0–34.0)
MCHC: 32.9 g/dL (ref 30.0–36.0)
MCV: 101.6 fL — AB (ref 78.0–100.0)
Platelets: 229 10*3/uL (ref 150–400)
RBC: 3.05 MIL/uL — ABNORMAL LOW (ref 4.22–5.81)
RDW: 12.9 % (ref 11.5–15.5)
WBC: 13.1 10*3/uL — ABNORMAL HIGH (ref 4.0–10.5)

## 2015-03-26 MED ORDER — AMOXICILLIN-POT CLAVULANATE 875-125 MG PO TABS: 1.0000 | ORAL_TABLET | Freq: Two times a day (BID) | ORAL | Status: DC

## 2015-03-26 MED ORDER — ENOXAPARIN SODIUM 40 MG/0.4ML ~~LOC~~ SOLN: 40.0000 mg | SUBCUTANEOUS | Status: DC

## 2015-03-26 MED ORDER — HYDROCODONE-ACETAMINOPHEN 5-325 MG PO TABS: 1.0000 | ORAL_TABLET | Freq: Four times a day (QID) | ORAL | Status: DC | PRN

## 2015-03-26 NOTE — Progress Notes (Signed)
SLP Cancellation Note  Patient Details Name: XZAVIAR MALOOF MRN: [id number] DOB: [birth year]   Cancelled treatment:        Reviewed chart for treatment today and note plans to d/c to SNF. Defer follow up swallowing treatment to SLP at [facility].    Niklaus Mamaril Meryl 03/26/2015, 1:45 PM

## 2015-03-26 NOTE — Discharge Summary (Signed)
Physician Discharge Summary  Wayne Schroeder ZHY:[id number] DOB: 03/11/1925 DOA: 03/21/2015  PCP: Martha Clan, MD  Admit date: 03/21/2015 Discharge date: 03/26/2015  Time spent: 40 minutes  Recommendations for Outpatient Follow-up:  1. Follow-up with primary care physician within one week. 2. Follow-up with orthopedics in 2 weeks. 3. Lovenox 40 mg subcutaneously daily for post hip arthroplasty DVT prophylaxis, for at least 2 weeks. 4. Augmentin for 5 more days. 5. Check BMP in 3 days to follow up on renal function, creatinine was 1.9 on discharge, baseline is 0.9. 6. I think he might benefit from SLP evaluation in the nursing home to rule out any potential dysphagia and aspiration.  Discharge Diagnoses:  Principal Problem:   Dementia Active Problems:   Closed right hip fracture (HCC)   Lower extremity edema   Lobar pneumonia (HCC)   Fall   Macrocytic anemia   Leukocytosis   ARF (acute renal failure) (HCC)   Discharge Condition: Stable  Diet recommendation: Heart healthy  Filed Weights   03/22/15 0000  Weight: 59.5 kg (131 lb 2.8 oz)    History of present illness:  Wayne Schroeder is a 81 y.o. male with history of dementia and lower extremity edema on Lasix was brought to the ER after patient had a fall at his house. Patient's fall was witnessed by patient's wife. As per patient's daughter who provided history patient did not hit his head or lose consciousness and has not complained of any chest pain or shortness of breath. In the ER x-rays reveal a right hip fracture and on-call orthopedic surgeon Dr. Victorino Dike was consulted and patient admitted for further management. On exam patient is mildly sedated. Patient also had abdominal hernia which was reduced by the ER physician. As per patient's daughter patient is used to ambulatory without help and is able to dress himself up after prompting.  Hospital Course:   Principal Problem: Displaced right hip fracture - Found to have  right hip fracture; underwent right hip hemiarthroplasty 03/23/2015  - No subsequent complications  - PT has seen the pt in consultation, recommends SNF - Discharged on Lovenox 40 mg for DVT prophylaxis and Norco for pain control  Active Problems: Lobar pneumonia, right lower lobe / Leukocytosis  - Right lower lobe pneumonia seen on CXR on 10/6 - Patient was on Rocephin and azithromycin, on discharge Augmentin for 5 more days. - No known history of dysphagia/aspiration, recommended SLP to follow in the nursing home.  Severe dementia - Stable - No behavioral disturbance   Macrocytic anemia - Hemoglobin stable at 10.3 - No reports of bleeding   LE edema - Hold of on lasix due to acute renal failure   Acute renal failure - Creatinine baseline is 0.9, creatinine increased to 1.9, BMP for today is still pending. - Likely due to dehydration from Lasix and being nothing by mouth for procedure. - Lasix held on discharge, check BMP and 3 days and restart Lasix or continue to hold accordingly.   Procedures: Right hip hemiarthroplasty, anterior approach, done by Dr. Linna Caprice on 03/23/2015 with implantation of DePuy Tri Lock stem, size 8, std offset, with a -3 mm spacer and a 53 mm monopolar head ball.  Consultations:  Ortho  Discharge Exam: Filed Vitals:   03/26/15 0535  BP: 150/75  Pulse: 93  Temp: 98.1 F (36.7 C)  Resp: 20   General: Alert and awake, oriented x3, not in any acute distress. HEENT: anicteric sclera, pupils reactive to light and accommodation, EOMI  CVS: S1-S2 clear, no murmur rubs or gallops Chest: clear to auscultation bilaterally, no wheezing, rales or rhonchi Abdomen: soft nontender, nondistended, normal bowel sounds, no organomegaly Extremities: no cyanosis, clubbing or edema noted bilaterally Neuro: Cranial nerves II-XII intact, no focal neurological deficits  Discharge Instructions   Discharge Instructions    Diet - low sodium heart healthy     Complete by:  As directed      Increase activity slowly    Complete by:  As directed           Current Discharge Medication List    START taking these medications   Details  amoxicillin-clavulanate (AUGMENTIN) 875-125 MG tablet Take 1 tablet by mouth 2 (two) times daily.    enoxaparin (LOVENOX) 40 MG/0.4ML injection Inject 0.4 mLs (40 mg total) into the skin daily. Qty: 0 Syringe    HYDROcodone-acetaminophen (NORCO/VICODIN) 5-325 MG tablet Take 1-2 tablets by mouth every 6 (six) hours as needed for moderate pain. Qty: 10 tablet, Refills: 0      CONTINUE these medications which have NOT CHANGED   Details  omeprazole (PRILOSEC) 20 MG capsule Take 1 capsule (20 mg total) by mouth 2 (two) times daily. Qty: 60 capsule, Refills: 1      STOP taking these medications     furosemide (LASIX) 20 MG tablet        No Known Allergies Follow-up Information    Follow up with Swinteck, Cloyde Reams, MD. Schedule an appointment as soon as possible for a visit in 2 weeks.   Specialty:  Orthopedic Surgery   Why:  For wound re-check   Contact information:   [address] [place] [address] [phone]        The results of significant diagnostics from this hospitalization (including imaging, microbiology, ancillary and laboratory) are listed below for reference.    Significant Diagnostic Studies: Dg Chest 1 View  03/21/2015   CLINICAL DATA:  Unwitnessed fall.  EXAM: CHEST 1 VIEW  COMPARISON:  11/06/2014  FINDINGS: The heart size and mediastinal contours are within normal limits. Both lungs are clear. The visualized skeletal structures are unremarkable.  IMPRESSION: No active disease.   Electronically Signed   By: Ellery Plunk M.D.   On: 03/21/2015 21:49   Dg Pelvis 1-2 Views  03/21/2015   CLINICAL DATA:  Status post fall. Concern for pelvic injury. Right thigh pain. Initial encounter.  EXAM: PELVIS - 1-2 VIEW  COMPARISON:  Abdominal radiographs performed  11/06/2014 and 12/23/2011  FINDINGS: There is new cortical irregularity at the right femoral neck, raising suspicion for a subcapital fracture of the right femoral neck. The right femoral head remains seated at the acetabulum.  Mild sclerotic change is noted at the sacroiliac joints. The left hip joint is unremarkable in appearance, with a small osseous density lateral to the joint space likely reflecting a small loose body. The visualized bowel gas pattern is grossly unremarkable.  IMPRESSION: New cortical irregularity of the right femoral neck, raising suspicion for a subcapital fracture of the right femoral neck.   Electronically Signed   By: Roanna Raider M.D.   On: 03/21/2015 21:49   US Pelvis Limited  03/24/2015   CLINICAL DATA:  Patient with right pubic hematoma. Swelling after discontinuation of Foley catheter.  EXAM: ULTRASOUND OF THE MALE PELVIS  COMPARISON:  None.  FINDINGS: The area of palpable concern was scanned with ultrasound and no definite hematoma or significant edema was identified.  IMPRESSION: No large hematoma or  area of edema identified at the site of concern.   Electronically Signed   By: Annia Belt M.D.   On: 03/24/2015 18:06   Pelvis Portable  03/23/2015   CLINICAL DATA:  Right hip hemiarthroplasty.  EXAM: PORTABLE PELVIS 1-2 VIEWS  COMPARISON:  Earlier same day  FINDINGS: Single view shows bipolar hemiarthroplasty of the right hip. Components appear well positioned. No radiographically detectable complication.  IMPRESSION: Good appearance following bipolar hemiarthroplasty on the right.   Electronically Signed   By: Paulina Fusi M.D.   On: 03/23/2015 20:56   Dg Chest Port 1 View  03/22/2015   CLINICAL DATA:  Hypoxia.  EXAM: PORTABLE CHEST 1 VIEW  COMPARISON:  03/21/2015  FINDINGS: Heart size is normal. There is patchy infiltrate in the right lung base consistent with infectious process. No edema. Left lung is clear.  IMPRESSION: Right lower lobe infiltrate.   Electronically  Signed   By: Norva Pavlov M.D.   On: 03/22/2015 14:04   Dg Knee Complete 4 Views Right  03/21/2015   CLINICAL DATA:  Unwitnessed fall at home now with right knee and thigh pain.  EXAM: RIGHT KNEE - COMPLETE 4+ VIEW  COMPARISON:  Right femur radiographs - earlier same day  FINDINGS: The lateral radiograph is degraded due to obliquity.  Osteopenia without definite displaced fracture. Mild degenerative change of the knee, most conspicuous within the medial compartment with joint space loss, subchondral sclerosis and osteophytosis. No evidence of chondrocalcinosis. No joint effusion or evidence of lipohemarthrosis. Adjacent vascular calcifications. No radiopaque foreign body.  IMPRESSION: Osteopenia without fracture or dislocation.   Electronically Signed   By: Simonne Come M.D.   On: 03/21/2015 21:49   Dg C-arm 1-60 Min-no Report  03/23/2015   CLINICAL DATA: surgery   C-ARM 1-60 MINUTES  Fluoroscopy was utilized by the requesting physician.  No radiographic  interpretation.    Dg Hip Operative Unilat With Pelvis Right  03/23/2015   CLINICAL DATA:  Right anterior hemi arthroplasty.  EXAM: OPERATIVE right HIP (WITH PELVIS IF PERFORMED) 2 VIEWS  TECHNIQUE: Fluoroscopic spot image(s) were submitted for interpretation post-operatively.  COMPARISON:  None.  FINDINGS: Two-view show bipolar hemiarthroplasty on the right. Components appear grossly well positioned. No identifiable complication.  IMPRESSION: Right bipolar hemiarthroplasty.   Electronically Signed   By: Paulina Fusi M.D.   On: 03/23/2015 20:17   Dg Femur, Min 2 Views Right  03/21/2015   CLINICAL DATA:  Fall  EXAM: RIGHT FEMUR 2 VIEWS  COMPARISON:  None.  FINDINGS: Subcapital femoral neck fracture with impaction is present. Osteopenia. Vascular calcifications are noted. No dislocation.  IMPRESSION: Acute subcapital right femoral neck fracture with impaction. Osteopenia.   Electronically Signed   By: Jolaine Click M.D.   On: 03/21/2015 21:49     Microbiology: Recent Results (from the past 240 hour(s))  Surgical pcr screen     Status: None   Collection Time: 03/22/15  7:54 AM  Result Value Ref Range Status   MRSA, PCR NEGATIVE NEGATIVE Final   Staphylococcus aureus NEGATIVE NEGATIVE Final    Comment:        The Xpert SA Assay (FDA approved for NASAL specimens in patients over 90 years of age), is one component of a comprehensive surveillance program.  Test performance has been validated by [facility] for patients greater than or equal to 36 year old. It is not intended to diagnose infection nor to guide or monitor treatment.      Labs: Basic  Metabolic Panel:  Recent Labs Lab 03/21/15 2207 03/22/15 0500 03/23/15 2215 03/24/15 0440 03/25/15 0435  NA 141 140  --  144 143  K 3.7 4.0  --  4.4 3.8  CL 106 105  --  107 109  CO2 34* 30  --  29 25  GLUCOSE 99 115*  --  124* 111*  BUN 29* 27*  --  14 23*  CREATININE 1.15 1.03 0.96 0.99 1.94*  CALCIUM 8.6* 8.6*  --  8.6* 8.5*   Liver Function Tests:  Recent Labs Lab 03/22/15 0500  AST 27  ALT 16*  ALKPHOS 45  BILITOT 0.9  PROT 5.8*  ALBUMIN 3.3*   No results for input(s): LIPASE, AMYLASE in the last 168 hours. No results for input(s): AMMONIA in the last 168 hours. CBC:  Recent Labs Lab 03/21/15 2207 03/22/15 0500 03/23/15 0435 03/23/15 2215 03/24/15 0440 03/25/15 0435 03/26/15 0552  WBC 9.7 11.4* 7.1 10.0 9.9 10.7* 13.1*  NEUTROABS 7.6 9.7*  --   --   --   --   --   HGB 10.0* 9.8* 10.5* 10.2* 9.9* 10.3* 10.2*  HCT 32.1* 30.8* 33.4* 31.6* 31.0* 31.4* 31.0*  MCV 105.2* 104.1* 102.8* 102.6* 103.7* 100.6* 101.6*  PLT 232 199 182 187 198 202 229   Cardiac Enzymes: No results for input(s): CKTOTAL, CKMB, CKMBINDEX, TROPONINI in the last 168 hours. BNP: BNP (last 3 results) No results for input(s): BNP in the last 8760 hours.  ProBNP (last 3 results) No results for input(s): PROBNP in the last 8760 hours.  CBG: No results for input(s):  GLUCAP in the last 168 hours.     Signed:  Ura Yingling A  [facility] 03/26/2015, 11:43 AM

## 2015-03-26 NOTE — Progress Notes (Signed)
Report called to Thomes Dinning at [facility]. Transportation arranged by Child psychotherapist. Pt dressed. Family aware of pending transport.

## 2015-03-26 NOTE — Progress Notes (Signed)
Pt for discharge to [facility].  CSW facilitated pt discharge needs including contacting facility, faxing pt discharge information via [facility], discussing with pt wife and pt son-in-law at bedside, providing RN phone number to call report, and arranging ambulance transport via [facility] for pt to [facility].  Pt family appreciative of assistance and eager for pt to get to [facility] for rehab.  No further social work needs identified at this time.  CSW signing off.   Loletta Specter, MSW, LCSW Clinical Social Work [phone]

## 2015-03-26 NOTE — Clinical Documentation Improvement (Signed)
Internal Medicine  Registered Dietitician documented "Severe malnutrition in context of chronic illness" on 03/22/15 and noted height 5 feet 8 inches, weight 131 pounds, 2.8 ounces and BMI 19.95.  Please document in your future progress notes and discharge summary if you agree with this assessment by the Registered Dietician.     Severe malnutrition in context of chronic illness  Other  Clinically Undetermined   Please exercise your independent, professional judgment when responding. A specific answer is not anticipated or expected.   Thank you, Doy Mince, RN [phone] Clinical Documentation Specialist

## 2015-03-26 NOTE — Progress Notes (Signed)
CSW continuing to follow.   CSW followed up with pt son-in-law, Brett Canales via telephone. Pt son-in-law confirmed choice for [facility] for pt upon discharge.  CSW notified [facility] and confirmed facility can accept pt when medically ready for discharge.  CSW awaiting attending MD to round on pt to determine if pt medically ready for discharge today.  CSW to continue to follow to provide support and assist with pt discharge to [facility] when pt medically stable.  Loletta Specter, MSW, LCSW Clinical Social Work [phone]

## 2015-03-26 NOTE — Clinical Social Work Placement (Signed)
   CLINICAL SOCIAL WORK PLACEMENT  NOTE  Date:  03/26/2015  Patient Details  Name: Wayne Schroeder MRN: [id number] Date of Birth: [birth year]  Clinical Social Work is seeking post-discharge placement for this patient at the Skilled  Nursing Facility level of care (*CSW will initial, date and re-position this form in  chart as items are completed):  Yes   Patient/family provided with [place] Clinical Social Work Department's list of facilities offering this level of care within the geographic area requested by the patient (or if unable, by the patient's family).  Yes   Patient/family informed of their freedom to choose among providers that offer the needed level of care, that participate in Medicare, Medicaid or managed care program needed by the patient, have an available bed and are willing to accept the patient.  Yes   Patient/family informed of [facility]'s ownership interest in [facility] and [facility], as well as of the fact that they are under no obligation to receive care at these facilities.  PASRR submitted to EDS on 03/24/15     PASRR number received on 03/24/15     Existing PASRR number confirmed on       FL2 transmitted to all facilities in geographic area requested by pt/family on 03/24/15     FL2 transmitted to all facilities within larger geographic area on       Patient informed that his/her managed care company has contracts with or will negotiate with certain facilities, including the following:        Yes   Patient/family informed of bed offers received.  Patient chooses bed at [facility]     Physician recommends and patient chooses bed at      Patient to be transferred to [facility] on 03/26/15.  Patient to be transferred to facility by ambulance [facility])     Patient family notified on 03/26/15 of transfer.  Name of family member notified:  pt wife and pt son-in-law, Brett Canales notified     PHYSICIAN Please sign FL2     Additional  Comment:    _______________________________________________ Orson Eva, LCSW 03/26/2015, 2:29 PM

## 2015-03-26 NOTE — Progress Notes (Signed)
   Subjective:  Pt demented. No events.  Objective:   VITALS:   Filed Vitals:   03/25/15 1332 03/25/15 1500 03/25/15 2136 03/26/15 0535  BP: 133/69  153/59 150/75  Pulse: 88  88 93  Temp: 98.3 F (36.8 C)  98.4 F (36.9 C) 98.1 F (36.7 C)  TempSrc: Oral  Axillary Axillary  Resp: Height:      Weight:      SpO2: 100% 98% 96% 100%    ABD soft Intact pulses distally Dorsiflexion/Plantar flexion intact Incision: dressing C/D/I Compartment soft  No cellulitis   Lab Results  Component Value Date   WBC 13.1* 03/26/2015   HGB 10.2* 03/26/2015   HCT 31.0* 03/26/2015   MCV 101.6* 03/26/2015   PLT 229 03/26/2015   BMET    Component Value Date/Time   NA 143 03/25/2015 0435   K 3.8 03/25/2015 0435   CL 109 03/25/2015 0435   CO2 25 03/25/2015 0435   GLUCOSE 111* 03/25/2015 0435   BUN 23* 03/25/2015 0435   CREATININE 1.94* 03/25/2015 0435   CALCIUM 8.5* 03/25/2015 0435   GFRNONAA 29* 03/25/2015 0435   GFRAA 34* 03/25/2015 0435     Assessment/Plan: 3 Days Post-Op   Principal Problem:   Dementia Active Problems:   Closed right hip fracture (HCC)   Lower extremity edema   Lobar pneumonia (HCC)   Fall   Macrocytic anemia   Leukocytosis   ARF (acute renal failure) (HCC)   WBAT with walker PO pain control lovenox for DVT ppx PT/OT SNF placement   Wayne Schroeder, Wayne Schroeder 03/26/2015, 6:47 AM   Samson Frederic, MD Cell [phone]

## 2015-03-26 NOTE — Progress Notes (Signed)
Physical Therapy Treatment Patient Details Name: Wayne Schroeder MRN: [id number] DOB: 12-28-1924 Today's Date: 03/26/2015    History of Present Illness patient is 81 yo male with history of dementiaa, fell at home on 10/5. Sustained femoral neck fracture. S/P Hip hemiarthroplasty from direct anterior approach 10/7.    PT Comments    POD # 3 pt in bed incont urine/bowel.  Assisted with hygiene the to [facility] + 2 assist.  Amb then positioned in recliner.   Follow Up Recommendations  SNF     Equipment Recommendations       Recommendations for Other Services       Precautions / Restrictions Precautions Precautions: Fall Precaution Comments: dementia Restrictions Weight Bearing Restrictions: No RLE Weight Bearing: Weight bearing as tolerated    Mobility  Bed Mobility Overal bed mobility: Needs Assistance;+2 for physical assistance Bed Mobility: Supine to Sit     Supine to sit: Total assist;+2 for physical assistance (pt 5%)     General bed mobility comments: Used bed pad to help get pt to EOB.  Once upright, pt was able to sit at Supervision level.   Transfers Overall transfer level: Needs assistance Equipment used: Rolling walker (2 wheeled) Transfers: Sit to/from Stand Sit to Stand: Max assist;+2 physical assistance;+2 safety/equipment         General transfer comment: MAX VC's for direction and hand over hand assist for cueing.  Assisted from bed to [facility] then to amb.    Ambulation/Gait Ambulation/Gait assistance: +2 safety/equipment;+2 physical assistance;Max assist Ambulation Distance (Feet): 28 Feet Assistive device: Rolling walker (2 wheeled) Gait Pattern/deviations: Step-to pattern;Step-through pattern;Decreased step length - right;Decreased step length - left;Decreased stride length;Shuffle;Trunk flexed Gait velocity: decreased   General Gait Details: amb with + 2 assist and guidence with walker advancement.  Very unsteady gait.  Impaired cognition.  HIGH  FALL risk.    Stairs            Wheelchair Mobility    Modified Rankin (Stroke Patients Only)       Balance                                    Cognition Arousal/Alertness: Awake/alert Behavior During Therapy: WFL for tasks assessed/performed Overall Cognitive Status: History of cognitive impairments - at baseline                      Exercises      General Comments        Pertinent Vitals/Pain Pain Assessment: No/denies pain Faces Pain Scale: No hurt    Home Living                      Prior Function            PT Goals (current goals can now be found in the care plan section) Progress towards PT goals: Progressing toward goals    Frequency  Min 3X/week    PT Plan Current plan remains appropriate    Co-evaluation             End of Session Equipment Utilized During Treatment: Gait belt Activity Tolerance: Patient tolerated treatment well Patient left: in chair     Time: 1040-1110 PT Time Calculation (min) (ACUTE ONLY): 30 min  Charges:  $Gait Training: 8-22 mins $Therapeutic Activity: 8-22 mins  G Codes:      Rica Koyanagi  PTA [facility]  Acute  Rehab Pager      [phone]

## 2015-03-26 NOTE — Clinical Social Work Placement (Signed)
   CLINICAL SOCIAL WORK PLACEMENT  NOTE  Date:  03/26/2015  Patient Details  Name: DAKIN MADANI MRN: [id number] Date of Birth: 1925/05/11  Clinical Social Work is seeking post-discharge placement for this patient at the Skilled  Nursing Facility level of care (*CSW will initial, date and re-position this form in  chart as items are completed):  Yes   Patient/family provided with [place] Clinical Social Work Department's list of facilities offering this level of care within the geographic area requested by the patient (or if unable, by the patient's family).  Yes   Patient/family informed of their freedom to choose among providers that offer the needed level of care, that participate in Medicare, Medicaid or managed care program needed by the patient, have an available bed and are willing to accept the patient.  Yes   Patient/family informed of [place]'s ownership interest in [facility] and [facility], as well as of the fact that they are under no obligation to receive care at these facilities.  PASRR submitted to EDS on 03/24/15     PASRR number received on 03/24/15     Existing PASRR number confirmed on       FL2 transmitted to all facilities in geographic area requested by pt/family on 03/24/15     FL2 transmitted to all facilities within larger geographic area on       Patient informed that his/her managed care company has contracts with or will negotiate with certain facilities, including the following:        Yes   Patient/family informed of bed offers received.  Patient chooses bed at [facility]     Physician recommends and patient chooses bed at      Patient to be transferred to   on  .  Patient to be transferred to facility by       Patient family notified on   of transfer.  Name of family member notified:        PHYSICIAN Please sign FL2     Additional Comment:    _______________________________________________ Orson Eva,  LCSW 03/26/2015, 11:33 AM

## 2015-03-27 ENCOUNTER — Non-Acute Institutional Stay (SKILLED_NURSING_FACILITY): Payer: Medicare Other | Admitting: Adult Health

## 2015-03-27 ENCOUNTER — Encounter: Payer: Self-pay | Admitting: Adult Health

## 2015-03-27 DIAGNOSIS — S72001S Fracture of unspecified part of neck of right femur, sequela: Secondary | ICD-10-CM | POA: Diagnosis not present

## 2015-03-27 DIAGNOSIS — N179 Acute kidney failure, unspecified: Secondary | ICD-10-CM

## 2015-03-27 DIAGNOSIS — K219 Gastro-esophageal reflux disease without esophagitis: Secondary | ICD-10-CM

## 2015-03-27 DIAGNOSIS — D539 Nutritional anemia, unspecified: Secondary | ICD-10-CM

## 2015-03-27 DIAGNOSIS — J181 Lobar pneumonia, unspecified organism: Secondary | ICD-10-CM

## 2015-03-27 DIAGNOSIS — E46 Unspecified protein-calorie malnutrition: Secondary | ICD-10-CM

## 2015-03-27 DIAGNOSIS — F0390 Unspecified dementia without behavioral disturbance: Secondary | ICD-10-CM

## 2015-03-27 NOTE — Progress Notes (Signed)
Patient ID: Wayne Schroeder, male   DOB: March 29, 1925, 81 y.o.   MRN: [id number]    DATE:  03/27/2015 MRN:  [id number]  BIRTHDAY: February 13, 1925  Facility:  Nursing Home Location:  [facility]  Nursing Home Room Number: [phone]  LEVEL OF CARE:  SNF [id number]  Contact Information    Name Relation Home Work Mobile   Paoli Spouse [phone]     Efrain Sella Daughter [phone]  [phone]   Raynaldo Opitz   [phone]       Chief Complaint  Patient presents with  . Hospitalization Follow-up    Right hip fracture S/P right hip hemiarthroplasty, lobar pneumonia, dementia, acute renal failure, GERD and protein calorie malnutrition    HISTORY OF PRESENT ILLNESS:  This is an 81 year old male who has been admitted to [facility] on 03/26/15 from [facility]. He has history of dementia and lower extremity edema and was taking Lasix. He had a witnessed fall at home and sustained a right hip fracture for which she had right hip hemiarthroplasty on 03/23/15. He also had abdominal hernia which was reduced by ER physician.  He has been admitted for a short-term rehabilitation.  PAST MEDICAL HISTORY:  Past Medical History  Diagnosis Date  . Dementia     CURRENT MEDICATIONS: Reviewed  Patient's Medications  New Prescriptions   No medications on file  Previous Medications   AMOXICILLIN-CLAVULANATE (AUGMENTIN) 875-125 MG TABLET    Take 1 tablet by mouth 2 (two) times daily.   ENOXAPARIN (LOVENOX) 40 MG/0.4ML INJECTION    Inject 0.4 mLs (40 mg total) into the skin daily.   HYDROCODONE-ACETAMINOPHEN (NORCO/VICODIN) 5-325 MG TABLET    Take 1-2 tablets by mouth every 6 (six) hours as needed for moderate pain.   OMEPRAZOLE (PRILOSEC) 20 MG CAPSULE    Take 1 capsule (20 mg total) by mouth 2 (two) times daily.  Modified Medications   No medications on file  Discontinued Medications   No medications on file     No Known Allergies   REVIEW OF SYSTEMS:   Unable to obtain due to advanced dementia  PHYSICAL EXAMINATION  GENERAL APPEARANCE:  In no acute distress.  SKIN:  Right hip with Aquacel dressing, dry, no erythema HEAD: Normal in size and contour. No evidence of trauma EYES: Lids open and close normally. No blepharitis, entropion or ectropion. PERRL. Conjunctivae are clear and sclerae are white. Lenses are without opacity EARS: Pinnae are normal. Patient hears normal voice tunes of the examiner MOUTH and THROAT: Lips are without lesions. Oral mucosa is moist and without lesions. Tongue is normal in shape, size, and color and without lesions NECK: supple, trachea midline, no neck masses, no thyroid tenderness, no thyromegaly LYMPHATICS: no LAN in the neck, no supraclavicular LAN RESPIRATORY: breathing is even & unlabored, BS CTAB CARDIAC: RRR, no murmur,no extra heart sounds, no edema GI: abdomen soft, normal BS, no masses, no tenderness, no hepatomegaly, no splenomegaly EXTREMITIES:  Able to move 4 extremities PSYCHIATRIC:  Affect and behavior are appropriate  LABS/RADIOLOGY: Labs reviewed: Basic Metabolic Panel:  Recent Labs  53/66/44 0500 03/23/15 2215 03/24/15 0440 03/25/15 0435  NA 140  --  144 143  K 4.0  --  4.4 3.8  CL 105  --  107 109  CO2 30  --  29 25  GLUCOSE 115*  --  124* 111*  BUN 27*  --  14 23*  CREATININE 1.03 0.96 0.99 1.94*  CALCIUM 8.6*  --  8.6*  8.5*   Liver Function Tests:  Recent Labs  11/06/14 2215 03/22/15 0500  AST 32 27  ALT 16* 16*  ALKPHOS 47 45  BILITOT 0.7 0.9  PROT 6.8 5.8*  ALBUMIN 3.9 3.3*    Recent Labs  11/06/14 2215  LIPASE 43   CBC:  Recent Labs  11/06/14 2215 03/21/15 2207 03/22/15 0500  03/24/15 0440 03/25/15 0435 03/26/15 0552  WBC 5.7 9.7 11.4*  < > 9.9 10.7* 13.1*  NEUTROABS 3.8 7.6 9.7*  --   --   --   --   HGB 10.4* 10.0* 9.8*  < > 9.9* 10.3* 10.2*  HCT 32.0* 32.1* 30.8*  < > 31.0* 31.4* 31.0*  MCV 99.4 105.2* 104.1*  < > 103.7* 100.6* 101.6*  PLT  254 232 199  < > 198 202 229  < > = values in this interval not displayed.  Cardiac Enzymes:  Recent Labs  11/06/14 2215  TROPONINI <0.03    Dg Chest 1 View  03/21/2015   CLINICAL DATA:  Unwitnessed fall.  EXAM: CHEST 1 VIEW  COMPARISON:  11/06/2014  FINDINGS: The heart size and mediastinal contours are within normal limits. Both lungs are clear. The visualized skeletal structures are unremarkable.  IMPRESSION: No active disease.   Electronically Signed   By: Ellery Plunk M.D.   On: 03/21/2015 21:49   Dg Pelvis 1-2 Views  03/21/2015   CLINICAL DATA:  Status post fall. Concern for pelvic injury. Right thigh pain. Initial encounter.  EXAM: PELVIS - 1-2 VIEW  COMPARISON:  Abdominal radiographs performed 11/06/2014 and 12/23/2011  FINDINGS: There is new cortical irregularity at the right femoral neck, raising suspicion for a subcapital fracture of the right femoral neck. The right femoral head remains seated at the acetabulum.  Mild sclerotic change is noted at the sacroiliac joints. The left hip joint is unremarkable in appearance, with a small osseous density lateral to the joint space likely reflecting a small loose body. The visualized bowel gas pattern is grossly unremarkable.  IMPRESSION: New cortical irregularity of the right femoral neck, raising suspicion for a subcapital fracture of the right femoral neck.   Electronically Signed   By: Roanna Raider M.D.   On: 03/21/2015 21:49   US Pelvis Limited  03/24/2015   CLINICAL DATA:  Patient with right pubic hematoma. Swelling after discontinuation of Foley catheter.  EXAM: ULTRASOUND OF THE MALE PELVIS  COMPARISON:  None.  FINDINGS: The area of palpable concern was scanned with ultrasound and no definite hematoma or significant edema was identified.  IMPRESSION: No large hematoma or area of edema identified at the site of concern.   Electronically Signed   By: Annia Belt M.D.   On: 03/24/2015 18:06   Pelvis Portable  03/23/2015   CLINICAL  DATA:  Right hip hemiarthroplasty.  EXAM: PORTABLE PELVIS 1-2 VIEWS  COMPARISON:  Earlier same day  FINDINGS: Single view shows bipolar hemiarthroplasty of the right hip. Components appear well positioned. No radiographically detectable complication.  IMPRESSION: Good appearance following bipolar hemiarthroplasty on the right.   Electronically Signed   By: Paulina Fusi M.D.   On: 03/23/2015 20:56   Dg Chest Port 1 View  03/22/2015   CLINICAL DATA:  Hypoxia.  EXAM: PORTABLE CHEST 1 VIEW  COMPARISON:  03/21/2015  FINDINGS: Heart size is normal. There is patchy infiltrate in the right lung base consistent with infectious process. No edema. Left lung is clear.  IMPRESSION: Right lower lobe infiltrate.   Electronically Signed  By: Norva Pavlov M.D.   On: 03/22/2015 14:04   Dg Knee Complete 4 Views Right  03/21/2015   CLINICAL DATA:  Unwitnessed fall at home now with right knee and thigh pain.  EXAM: RIGHT KNEE - COMPLETE 4+ VIEW  COMPARISON:  Right femur radiographs - earlier same day  FINDINGS: The lateral radiograph is degraded due to obliquity.  Osteopenia without definite displaced fracture. Mild degenerative change of the knee, most conspicuous within the medial compartment with joint space loss, subchondral sclerosis and osteophytosis. No evidence of chondrocalcinosis. No joint effusion or evidence of lipohemarthrosis. Adjacent vascular calcifications. No radiopaque foreign body.  IMPRESSION: Osteopenia without fracture or dislocation.   Electronically Signed   By: Simonne Come M.D.   On: 03/21/2015 21:49   Dg C-arm 1-60 Min-no Report  03/23/2015   CLINICAL DATA: surgery   C-ARM 1-60 MINUTES  Fluoroscopy was utilized by the requesting physician.  No radiographic  interpretation.    Dg Hip Operative Unilat With Pelvis Right  03/23/2015   CLINICAL DATA:  Right anterior hemi arthroplasty.  EXAM: OPERATIVE right HIP (WITH PELVIS IF PERFORMED) 2 VIEWS  TECHNIQUE: Fluoroscopic spot image(s) were submitted  for interpretation post-operatively.  COMPARISON:  None.  FINDINGS: Two-view show bipolar hemiarthroplasty on the right. Components appear grossly well positioned. No identifiable complication.  IMPRESSION: Right bipolar hemiarthroplasty.   Electronically Signed   By: Paulina Fusi M.D.   On: 03/23/2015 20:17   Dg Femur, Min 2 Views Right  03/21/2015   CLINICAL DATA:  Fall  EXAM: RIGHT FEMUR 2 VIEWS  COMPARISON:  None.  FINDINGS: Subcapital femoral neck fracture with impaction is present. Osteopenia. Vascular calcifications are noted. No dislocation.  IMPRESSION: Acute subcapital right femoral neck fracture with impaction. Osteopenia.   Electronically Signed   By: Jolaine Click M.D.   On: 03/21/2015 21:49    ASSESSMENT/PLAN:  Right hip fracture S/P right hip hemiarthroplasty - for rehabilitation; continue Lovenox 40 mg subcutaneous daily for DVT prophylaxis; Norco 5/325 mg 1-2 tabs by mouth every 6 hours when necessary for pain; follow-up with Dr. Linna Caprice, her to predict surgeon, in 2 weeks  Lobar pneumonia - given Rocephin and azithromycin in the hospital and discharged on Augmentin 875-125 mg 1 tab by mouth twice a day 5 more days of the colon ST evaluation to rule out dysphagia and aspiration  Dementia - advanced; supportive care  Macrocytic Anemia  - hemoglobin 10.2; check CBC  Acute renal failure - baseline creatinine 0.9; current creatinine 1.92; check BMP  GERD - continue Prilosec 20 mg 1 capsule by mouth twice a day  Protein calorie malnutrition - albumin 3.3; start Procel 1 scoop by mouth twice a day      Goals of care:  Short-term rehabilitation    [facility], NP [facility] [phone]

## 2015-03-28 ENCOUNTER — Inpatient Hospital Stay (HOSPITAL_COMMUNITY)
Admission: EM | Admit: 2015-03-28 | Discharge: 2015-04-02 | DRG: 871 | Disposition: A | Discharge status: Skilled Nursing Facility | Payer: Medicare Other | Attending: Internal Medicine | Admitting: Internal Medicine

## 2015-03-28 ENCOUNTER — Emergency Department (HOSPITAL_COMMUNITY): Payer: Medicare Other

## 2015-03-28 ENCOUNTER — Encounter (HOSPITAL_COMMUNITY): Payer: Self-pay | Admitting: *Deleted

## 2015-03-28 DIAGNOSIS — E870 Hyperosmolality and hypernatremia: Secondary | ICD-10-CM | POA: Diagnosis not present

## 2015-03-28 DIAGNOSIS — R319 Hematuria, unspecified: Secondary | ICD-10-CM | POA: Diagnosis present

## 2015-03-28 DIAGNOSIS — F0390 Unspecified dementia without behavioral disturbance: Secondary | ICD-10-CM | POA: Diagnosis present

## 2015-03-28 DIAGNOSIS — R509 Fever, unspecified: Secondary | ICD-10-CM

## 2015-03-28 DIAGNOSIS — J181 Lobar pneumonia, unspecified organism: Secondary | ICD-10-CM | POA: Diagnosis present

## 2015-03-28 DIAGNOSIS — N1330 Unspecified hydronephrosis: Secondary | ICD-10-CM | POA: Diagnosis present

## 2015-03-28 DIAGNOSIS — I248 Other forms of acute ischemic heart disease: Secondary | ICD-10-CM | POA: Diagnosis present

## 2015-03-28 DIAGNOSIS — E43 Unspecified severe protein-calorie malnutrition: Secondary | ICD-10-CM | POA: Diagnosis present

## 2015-03-28 DIAGNOSIS — E860 Dehydration: Secondary | ICD-10-CM | POA: Diagnosis present

## 2015-03-28 DIAGNOSIS — Z96641 Presence of right artificial hip joint: Secondary | ICD-10-CM | POA: Diagnosis present

## 2015-03-28 DIAGNOSIS — A419 Sepsis, unspecified organism: Principal | ICD-10-CM | POA: Diagnosis present

## 2015-03-28 DIAGNOSIS — D539 Nutritional anemia, unspecified: Secondary | ICD-10-CM | POA: Diagnosis present

## 2015-03-28 DIAGNOSIS — D649 Anemia, unspecified: Secondary | ICD-10-CM | POA: Diagnosis not present

## 2015-03-28 DIAGNOSIS — N179 Acute kidney failure, unspecified: Secondary | ICD-10-CM | POA: Diagnosis not present

## 2015-03-28 DIAGNOSIS — R7989 Other specified abnormal findings of blood chemistry: Secondary | ICD-10-CM | POA: Diagnosis present

## 2015-03-28 DIAGNOSIS — Z7901 Long term (current) use of anticoagulants: Secondary | ICD-10-CM

## 2015-03-28 DIAGNOSIS — R778 Other specified abnormalities of plasma proteins: Secondary | ICD-10-CM | POA: Diagnosis present

## 2015-03-28 DIAGNOSIS — F0391 Unspecified dementia with behavioral disturbance: Secondary | ICD-10-CM | POA: Diagnosis present

## 2015-03-28 DIAGNOSIS — D72829 Elevated white blood cell count, unspecified: Secondary | ICD-10-CM | POA: Diagnosis present

## 2015-03-28 DIAGNOSIS — N390 Urinary tract infection, site not specified: Secondary | ICD-10-CM | POA: Diagnosis present

## 2015-03-28 DIAGNOSIS — Z515 Encounter for palliative care: Secondary | ICD-10-CM | POA: Insufficient documentation

## 2015-03-28 DIAGNOSIS — F03918 Unspecified dementia, unspecified severity, with other behavioral disturbance: Secondary | ICD-10-CM | POA: Diagnosis present

## 2015-03-28 DIAGNOSIS — Z681 Body mass index (BMI) 19 or less, adult: Secondary | ICD-10-CM

## 2015-03-28 DIAGNOSIS — E876 Hypokalemia: Secondary | ICD-10-CM | POA: Diagnosis present

## 2015-03-28 DIAGNOSIS — Z79891 Long term (current) use of opiate analgesic: Secondary | ICD-10-CM

## 2015-03-28 DIAGNOSIS — Z79899 Other long term (current) drug therapy: Secondary | ICD-10-CM

## 2015-03-28 DIAGNOSIS — R600 Localized edema: Secondary | ICD-10-CM | POA: Diagnosis present

## 2015-03-28 DIAGNOSIS — Z66 Do not resuscitate: Secondary | ICD-10-CM | POA: Diagnosis not present

## 2015-03-28 DIAGNOSIS — R627 Adult failure to thrive: Secondary | ICD-10-CM | POA: Diagnosis present

## 2015-03-28 LAB — CBC WITH DIFFERENTIAL/PLATELET
Basophils Absolute: 0 10*3/uL (ref 0.0–0.1)
Basophils Relative: 0 %
Eosinophils Absolute: 0.1 10*3/uL (ref 0.0–0.7)
Eosinophils Relative: 0 %
HEMATOCRIT: 28.9 % — AB (ref 39.0–52.0)
HEMOGLOBIN: 9.5 g/dL — AB (ref 13.0–17.0)
LYMPHS ABS: 0.8 10*3/uL (ref 0.7–4.0)
LYMPHS PCT: 5 %
MCH: 33.6 pg (ref 26.0–34.0)
MCHC: 32.9 g/dL (ref 30.0–36.0)
MCV: 102.1 fL — AB (ref 78.0–100.0)
MONOS PCT: 9 %
Monocytes Absolute: 1.5 10*3/uL — ABNORMAL HIGH (ref 0.1–1.0)
NEUTROS ABS: 13.6 10*3/uL — AB (ref 1.7–7.7)
NEUTROS PCT: 86 %
Platelets: 278 10*3/uL (ref 150–400)
RBC: 2.83 MIL/uL — ABNORMAL LOW (ref 4.22–5.81)
RDW: 13 % (ref 11.5–15.5)
WBC: 15.9 10*3/uL — ABNORMAL HIGH (ref 4.0–10.5)

## 2015-03-28 LAB — URINALYSIS, ROUTINE W REFLEX MICROSCOPIC
Bilirubin Urine: NEGATIVE
GLUCOSE, UA: NEGATIVE mg/dL
KETONES UR: NEGATIVE mg/dL
Nitrite: NEGATIVE
PROTEIN: 100 mg/dL — AB
SPECIFIC GRAVITY, URINE: 1.014 (ref 1.005–1.030)
Urobilinogen, UA: 0.2 mg/dL (ref 0.0–1.0)
pH: 5.5 (ref 5.0–8.0)

## 2015-03-28 LAB — COMPREHENSIVE METABOLIC PANEL
ALT: 12 U/L — ABNORMAL LOW (ref 17–63)
AST: 47 U/L — AB (ref 15–41)
Albumin: 3.1 g/dL — ABNORMAL LOW (ref 3.5–5.0)
Alkaline Phosphatase: 53 U/L (ref 38–126)
Anion gap: 8 (ref 5–15)
BILIRUBIN TOTAL: 0.7 mg/dL (ref 0.3–1.2)
BUN: 65 mg/dL — AB (ref 6–20)
CHLORIDE: 116 mmol/L — AB (ref 101–111)
CO2: 26 mmol/L (ref 22–32)
Calcium: 9.1 mg/dL (ref 8.9–10.3)
Creatinine, Ser: 2.84 mg/dL — ABNORMAL HIGH (ref 0.61–1.24)
GFR calc Af Amer: 21 mL/min — ABNORMAL LOW (ref >60)
GFR calc non Af Amer: 18 mL/min — ABNORMAL LOW (ref >60)
GLUCOSE: 122 mg/dL — AB (ref 65–99)
POTASSIUM: 4.1 mmol/L (ref 3.5–5.1)
Sodium: 150 mmol/L — ABNORMAL HIGH (ref 135–145)
TOTAL PROTEIN: 6.5 g/dL (ref 6.5–8.1)

## 2015-03-28 LAB — URINE MICROSCOPIC-ADD ON

## 2015-03-28 LAB — LACTIC ACID, PLASMA: Lactic Acid, Venous: 0.9 mmol/L (ref 0.5–2.0)

## 2015-03-28 LAB — TROPONIN I: Troponin I: 0.05 ng/mL — ABNORMAL HIGH (ref <0.031)

## 2015-03-28 MED ORDER — LIDOCAINE HCL 2 % EX GEL
1.0000 "application " | Freq: Once | CUTANEOUS | Status: AC
  Administered 2015-03-28: 1 via URETHRAL

## 2015-03-28 MED ORDER — DEXTROSE 5 % IV SOLN
1.0000 g | Freq: Once | INTRAVENOUS | Status: AC
  Administered 2015-03-29: 1 g via INTRAVENOUS
  Filled 2015-03-28: qty 10

## 2015-03-28 MED ORDER — SODIUM CHLORIDE 0.9 % IV BOLUS (SEPSIS)
500.0000 mL | Freq: Once | INTRAVENOUS | Status: AC
  Administered 2015-03-28: 500 mL via INTRAVENOUS

## 2015-03-28 MED ORDER — SODIUM CHLORIDE 0.9 % IV SOLN: INTRAVENOUS | Status: DC

## 2015-03-28 NOTE — H&P (Signed)
PCP:  Martha Clan, MD  Gerald Leitz  Referring provider Clarene Duke   Chief Complaint:  Not eating or drinking  HPI: Wayne Schroeder is a 81 y.o. male   has a past medical history of Dementia.   Presented with poor by mouth intake from nursing home.  Patient with Known history of dementia family states that prior to the fall he was able to ambulate and get himself dressed but he was basically nonverbal secondary to severe dementia. Pateitn  Have had recent admition for closed right hip fracture status post right hemearthroplasty on October 7 was discharged on 10 follow October,  at baseline poorly verbal.  was discharged to [facility] on Lovenox 40 mg for DVT prophylaxis. His hospital stay was complicated by lobar pneumonia which was treated initially by Rocephin and azithromycin and was discharged on Augmentin he supposed to take for 5 more days.  there was some question whether this was aspiration related  Patient has chronic lower extremity edema and has been taking Lasix. He had history of abdominal hernia that required reduction by ER physician 1 week ago.  Per Nursing home facility staff patient stopped eating and drinking. His lab work was done showed elevated white blood cell count rising creatinine. In emergency department patient was found to have white blood cell, 15.9 hemoglobin 9.5 which is close to his baseline of 10.3 creatinine up to 2.8 from baseline of 0.9 of note on 10/08 it was elevated to 1.94. Today sodium was 150.  he was noted to have urinary tract infection . This was associated hematuria as per family patient has pulled.his catheter during recent hospitalization.  of note in emergency time patient was noticed to be slightly elevated troponin at 0.05 he denies any chest pain shortness of breath no changes from EKG . in ER patient make criteria for sepsis with fever up to 100.8 respirations above 20 heart rate maxed at 92 and white blood cell count of 15.9  Hospitalist was  called for admission for  Dehydration hypernatremia evidence of sepsis    review of Systems:    Pertinent positives include: fever, poor PO intake  Constitutional:  No weight loss, night sweats, Fevers, chills, fatigue, weight loss  HEENT:  No headaches, Difficulty swallowing,Tooth/dental problems,Sore throat,  No sneezing, itching, ear ache, nasal congestion, post nasal drip,  Cardio-vascular:  No chest pain, Orthopnea, PND, anasarca, dizziness, palpitations.no Bilateral lower extremity swelling  GI:  No heartburn, indigestion, abdominal pain, nausea, vomiting, diarrhea, change in bowel habits, loss of appetite, melena, blood in stool, hematemesis Resp:  no shortness of breath at rest. No dyspnea on exertion, No excess mucus, no productive cough, No non-productive cough, No coughing up of blood.No change in color of mucus.No wheezing. Skin:  no rash or lesions. No jaundice GU:  no dysuria, change in color of urine, no urgency or frequency. No straining to urinate.  No flank pain.  Musculoskeletal:  No joint pain or no joint swelling. No decreased range of motion. No back pain.  Psych:  No change in mood or affect. No depression or anxiety. No memory loss.  Neuro: no localizing neurological complaints, no tingling, no weakness, no double vision, no gait abnormality, no slurred speech, no confusion  Otherwise ROS are negative except for above, 10 systems were reviewed  Past Medical History: Past Medical History  Diagnosis Date  . Dementia    Past Surgical History  Procedure Laterality Date  . Back surgery    . Total  hip arthroplasty Right      Medications: Prior to Admission medications   Medication Sig Start Date End Date Taking? Authorizing Provider  amoxicillin-clavulanate (AUGMENTIN) 875-125 MG tablet Take 1 tablet by mouth 2 (two) times daily. 03/26/15   Clydia Llano, MD  enoxaparin (LOVENOX) 40 MG/0.4ML injection Inject 0.4 mLs (40 mg total) into the skin daily.  03/26/15   Clydia Llano, MD  HYDROcodone-acetaminophen (NORCO/VICODIN) 5-325 MG tablet Take 1-2 tablets by mouth every 6 (six) hours as needed for moderate pain. 03/26/15   Clydia Llano, MD  omeprazole (PRILOSEC) 20 MG capsule Take 1 capsule (20 mg total) by mouth 2 (two) times daily. Patient not taking: Reported on 03/21/2015 11/07/14   Rolland Porter, MD    Allergies:  No Known Allergies  Social History:  Ambulatory now walker undergoing rehabilitation From facility [facility]   reports that he has never smoked. He does not have any smokeless tobacco history on file. He reports that he does not drink alcohol or use illicit drugs.    Family History: family history includes Dementia in his other.    Physical Exam: Patient Vitals for the past 24 hrs:  BP Temp Temp src Pulse Resp SpO2  03/28/15 2020 152/65 mmHg 100.8 F (38.2 C) Rectal 92 18 97 %    1. General:  in No Acute distress 2. Psychological: Alert not Oriented mumbling during any questions 3. Head/ENT:     Dry Mucous Membranes                          Head Non traumatic, neck supple                           Poor Dentition 4. SKIN:   decreased Skin turgor,  Skin clean Dry and intact no rash 5. Heart: Regular rate and rhythm no Murmur, Rub or gallop 6. Lungs:  , no wheezes some mild crackles   7. Abdomen: Soft, non-tender, Non distended 8. Lower extremities: no clubbing, cyanosis, or edema 9. Neurologically Grossly intact, moving all 4 extremities equally 10. MSK: Normal range of motion  body mass index is unknown because there is no weight on file.   Labs on Admission:   Results for orders placed or performed during the hospital encounter of 03/28/15 (from the past 24 hour(s))  Comprehensive metabolic panel     Status: Abnormal   Collection Time: 03/28/15  8:23 PM  Result Value Ref Range   Sodium 150 (H) 135 - 145 mmol/L   Potassium 4.1 3.5 - 5.1 mmol/L   Chloride 116 (H) 101 - 111 mmol/L   CO2 26 22 - 32  mmol/L   Glucose, Bld 122 (H) 65 - 99 mg/dL   BUN 65 (H) 6 - 20 mg/dL   Creatinine, Ser 1.61 (H) 0.61 - 1.24 mg/dL   Calcium 9.1 8.9 - 09.6 mg/dL   Total Protein 6.5 6.5 - 8.1 g/dL   Albumin 3.1 (L) 3.5 - 5.0 g/dL   AST 47 (H) 15 - 41 U/L   ALT 12 (L) 17 - 63 U/L   Alkaline Phosphatase 53 38 - 126 U/L   Total Bilirubin 0.7 0.3 - 1.2 mg/dL   GFR calc non Af Amer 18 (L) >60 mL/min   GFR calc Af Amer 21 (L) >60 mL/min   Anion gap 8 5 - 15  Troponin I     Status: Abnormal   Collection  Time: 03/28/15  8:23 PM  Result Value Ref Range   Troponin I 0.05 (H) <0.031 ng/mL  Lactic acid, plasma     Status: None   Collection Time: 03/28/15  8:23 PM  Result Value Ref Range   Lactic Acid, Venous 0.9 0.5 - 2.0 mmol/L  CBC with Differential     Status: Abnormal   Collection Time: 03/28/15  8:23 PM  Result Value Ref Range   WBC 15.9 (H) 4.0 - 10.5 K/uL   RBC 2.83 (L) 4.22 - 5.81 MIL/uL   Hemoglobin 9.5 (L) 13.0 - 17.0 g/dL   HCT 16.1 (L) 09.6 - 04.5 %   MCV 102.1 (H) 78.0 - 100.0 fL   MCH 33.6 26.0 - 34.0 pg   MCHC 32.9 30.0 - 36.0 g/dL   RDW 40.9 81.1 - 91.4 %   Platelets 278 150 - 400 K/uL   Neutrophils Relative % 86 %   Neutro Abs 13.6 (H) 1.7 - 7.7 K/uL   Lymphocytes Relative 5 %   Lymphs Abs 0.8 0.7 - 4.0 K/uL   Monocytes Relative 9 %   Monocytes Absolute 1.5 (H) 0.1 - 1.0 K/uL   Eosinophils Relative 0 %   Eosinophils Absolute 0.1 0.0 - 0.7 K/uL   Basophils Relative 0 %   Basophils Absolute 0.0 0.0 - 0.1 K/uL  Urinalysis, Routine w reflex microscopic     Status: Abnormal   Collection Time: 03/28/15 10:13 PM  Result Value Ref Range   Color, Urine BROWN (A) YELLOW   APPearance TURBID (A) CLEAR   Specific Gravity, Urine 1.014 1.005 - 1.030   pH 5.5 5.0 - 8.0   Glucose, UA NEGATIVE NEGATIVE mg/dL   Hgb urine dipstick LARGE (A) NEGATIVE   Bilirubin Urine NEGATIVE NEGATIVE   Ketones, ur NEGATIVE NEGATIVE mg/dL   Protein, ur 782 (A) NEGATIVE mg/dL   Urobilinogen, UA 0.2 0.0 - 1.0  mg/dL   Nitrite NEGATIVE NEGATIVE   Leukocytes, UA SMALL (A) NEGATIVE  Urine microscopic-add on     Status: Abnormal   Collection Time: 03/28/15 10:13 PM  Result Value Ref Range   Squamous Epithelial / LPF RARE RARE   WBC, UA 21-50 <3 WBC/hpf   RBC / HPF TOO NUMEROUS TO COUNT <3 RBC/hpf   Casts GRANULAR CAST (A) NEGATIVE   Urine-Other AMORPHOUS URATES/PHOSPHATES     UA evidence of UTi  No results found for: HGBA1C  Estimated Creatinine Clearance: 15.1 mL/min (by C-G formula based on Cr of 2.84).  BNP (last 3 results) No results for input(s): PROBNP in the last 8760 hours.  Other results:  I have pearsonaly reviewed this: ECG REPORT  Rate:92  Rhythm: Normal sinus rhythm   ST&T Change:  No acute ischemic changes but somewhat flat t-waves noted  QTC 439  There were no vitals filed for this visit.   Cultures:    Component Value Date/Time   SDES URINE, CATHETERIZED 11/06/2014 2354   SPECREQUEST NONE 11/06/2014 2354   CULT NO GROWTH Performed at [facility]  11/06/2014 2354   REPTSTATUS 11/08/2014 FINAL 11/06/2014 2354     Radiological Exams on Admission: Dg Chest Port 1 View  03/28/2015  CLINICAL DATA:  81 year old male with poor p.o. intake and increased white count. EXAM: PORTABLE CHEST 1 VIEW COMPARISON:  03/22/2015 and prior radiographs FINDINGS: The cardiomediastinal silhouette is unremarkable. There is no evidence of focal airspace disease, pulmonary edema, suspicious pulmonary nodule/mass, pleural effusion, or pneumothorax. No acute bony abnormalities are identified. IMPRESSION: No  active disease. Electronically Signed   By: Harmon PierJeffrey  Hu M.D.   On: 03/28/2015 22:12    Chart has been reviewed  Family   at  Bedside  plan of care was discussed with Son in law,   Daughter  can be reached on the phone : [phone] home or [phone] and [phone] cell  she also has an elderly wife at home  Assessment/Plan   81 year old gentleman history of dementia and  anemia was recently admitted for lobar pneumonia and right hip fracture status post hip replacement was discharged to nursing home facility but stopped eating and drinking admitted for acute renal failure, hypernatremia, dehydration, possible urinary tract infection vs other source of sepsis  Present on Admission:  . Sepsis (HCC) - source unclear although there is white blood cell count in the urine is also increased red blood cell count urine appears to be clearly bloody. Chest x-ray preliminary appears to be unremarkable we'll repeat after rehydration. Patient may be at risk for aspiration will get speech pathology to reevaluate. Given recent operative intervention and hip replacement will cover broadly and obtain blood cultures  . Macrocytic anemia - this is chronic will continue to follow at this point no indication for transfusion  . Dementia - chronic end-stage. Will benefit from palliative care consult  . ARF (acute renal failure) (HCC)- likely secondary to dehydration, check FeNA and if not improved with IVF would obtain renal US and consider renal consult.  . Elevated troponin in a setting of acute dehydration worsening renal failure. Unlikely to be acute coronary syndrome but will cycle cardiac enzymes obtain echogram . UTI (urinary tract infection) unclear if true UTI versus white blood cell, elevated then urine secondary to bloody urine. We'll obtain urine culture so far will cover for sepsis with broad-spectrum antibiotics   hematuria - Patient has had a urethral trauma secondary to pulling out catheter. Will obtain renal ultrasound to make sure there is no evidence of obstruction  if persist would benefit from urology consult  . Acute hypernatremia - was likely secondary to poor by mouth intake patient has end-stage dementia. Will rehydrate. Give D5 half normal and full closely sodium levels every 4 hours. That will need to discuss overall goals of care with family. Currently wife not at  bedside who is the main Management consultantdecision maker. Recommend palliative care consult to help with establishment of goals of care    Prophylaxis:  Lovenox   CODE STATUS:  FULL CODE   as per family, apparently nursing home facility has stated that they will not help patient if he is trying to choke if he is a DO NOT RESUSCITATE to reach point family members change him to full code. I think discussion with palliative team regarding goals of care and working on specific instruction to the nursing home facility would be helpful  Disposition:                            Back to current facility when stable                            Other plan as per orders.  I have spent a total of 55 min on this admission  Wayne Schroeder 03/28/2015, 11:30 PM  [facility]  Pager [phone]   after 2 AM please page floor coverage PA If 7AM-7PM, please contact the day team taking care of the patient  [email or web address]  Password [employer]

## 2015-03-28 NOTE — Progress Notes (Signed)
Patient recently discharged from hospital from 10/05 to 10/10 for fractured right hip.  [facility]

## 2015-03-28 NOTE — ED Provider Notes (Signed)
CSN: [id number]     Arrival date & time 03/28/15  2008 History   First MD Initiated Contact with Patient 03/28/15 2012     Chief Complaint  Patient presents with  . Failure To Thrive  . Abnormal Lab      The history is provided by a relative, the EMS personnel and the nursing home. The history is limited by the condition of the patient (Hx dementia).  Pt was seen at 2015.  Per EMS, NH report and pt's family: Pt at [facility] after being d/c from hospital 2 days ago s/p right THR. NH states pt has not been eating or drinking today. Family states pt is acting per his baseline (dementia, non-verbal).  No reported vomiting/diarrhea, no fevers.    Past Medical History  Diagnosis Date  . Dementia    Past Surgical History  Procedure Laterality Date  . Back surgery    . Total hip arthroplasty Right    Family History  Problem Relation Age of Onset  . Dementia Other    Social History  Substance Use Topics  . Smoking status: Never Smoker   . Smokeless tobacco: None  . Alcohol Use: No    Review of Systems  Unable to perform ROS: Dementia     Allergies  Review of patient's allergies indicates no known allergies.  Home Medications   Prior to Admission medications   Medication Sig Start Date End Date Taking? Authorizing Provider  amoxicillin-clavulanate (AUGMENTIN) 875-125 MG tablet Take 1 tablet by mouth 2 (two) times daily. 03/26/15   Clydia Llano, MD  enoxaparin (LOVENOX) 40 MG/0.4ML injection Inject 0.4 mLs (40 mg total) into the skin daily. 03/26/15   Clydia Llano, MD  HYDROcodone-acetaminophen (NORCO/VICODIN) 5-325 MG tablet Take 1-2 tablets by mouth every 6 (six) hours as needed for moderate pain. 03/26/15   Clydia Llano, MD  omeprazole (PRILOSEC) 20 MG capsule Take 1 capsule (20 mg total) by mouth 2 (two) times daily. Patient not taking: Reported on 03/21/2015 11/07/14   Rolland Porter, MD   BP 152/65 mmHg  Pulse 92  Temp(Src) 100.8 F (38.2 C) (Rectal)  Resp 18  SpO2  97% Physical Exam  2020: Physical examination:  Nursing notes reviewed; Vital signs and O2 SAT reviewed;  Constitutional: Thin, frail. In no acute distress; Head:  Normocephalic, atraumatic; Eyes: EOMI, PERRL, No scleral icterus; ENMT: Mouth and pharynx normal, Mucous membranes dry; Neck: Full range of motion, No lymphadenopathy; Cardiovascular: Regular rate and rhythm, No gallop; Respiratory: Breath sounds clear & equal bilaterally, No wheezes. Normal respiratory effort/excursion; Chest: Nontender, Movement normal; Abdomen: Soft, Nontender, Nondistended, Normal bowel sounds; Genitourinary: No CVA tenderness; Extremities: Pulses normal, +DSD right hip C/D/I, no erythema, no ecchymosis. No tenderness, No edema, No calf edema or asymmetry.; Neuro: Awake, alert. Laying supine with eyes open. Occasionally moans. No facial droop.  Moves extremities spontaneously on stretcher. .; Skin: Color normal, Warm, Dry.   ED Course  Procedures (including critical care time) Labs Review   Imaging Review  I have personally reviewed and evaluated these images and lab results as part of my medical decision-making.   EKG Interpretation   Date/Time:  Wednesday March 28 2015 20:19:12 EDT Ventricular Rate:  92 PR Interval:  122 QRS Duration: 72 QT Interval:  355 QTC Calculation: 439 R Axis:   52 Text Interpretation:  Sinus rhythm Low voltage, extremity leads Artifact  When compared with ECG of 03/22/2015 No significant change was found  Confirmed by [facility]  MD, Nicholos Johns [id number]) on  03/28/2015 9:53:45 PM      MDM  MDM Reviewed: previous chart, nursing note and vitals Reviewed previous: labs and ECG Interpretation: labs, ECG and x-ray   Results for orders placed or performed during the hospital encounter of 03/28/15  Comprehensive metabolic panel  Result Value Ref Range   Sodium 150 (H) 135 - 145 mmol/L   Potassium 4.1 3.5 - 5.1 mmol/L   Chloride 116 (H) 101 - 111 mmol/L   CO2 26 22 - 32 mmol/L    Glucose, Bld 122 (H) 65 - 99 mg/dL   BUN 65 (H) 6 - 20 mg/dL   Creatinine, Ser 8.11 (H) 0.61 - 1.24 mg/dL   Calcium 9.1 8.9 - 91.4 mg/dL   Total Protein 6.5 6.5 - 8.1 g/dL   Albumin 3.1 (L) 3.5 - 5.0 g/dL   AST 47 (H) 15 - 41 U/L   ALT 12 (L) 17 - 63 U/L   Alkaline Phosphatase 53 38 - 126 U/L   Total Bilirubin 0.7 0.3 - 1.2 mg/dL   GFR calc non Af Amer 18 (L) >60 mL/min   GFR calc Af Amer 21 (L) >60 mL/min   Anion gap 8 5 - 15  Troponin I  Result Value Ref Range   Troponin I 0.05 (H) <0.031 ng/mL  Lactic acid, plasma  Result Value Ref Range   Lactic Acid, Venous 0.9 0.5 - 2.0 mmol/L  CBC with Differential  Result Value Ref Range   WBC 15.9 (H) 4.0 - 10.5 K/uL   RBC 2.83 (L) 4.22 - 5.81 MIL/uL   Hemoglobin 9.5 (L) 13.0 - 17.0 g/dL   HCT 78.2 (L) 95.6 - 21.3 %   MCV 102.1 (H) 78.0 - 100.0 fL   MCH 33.6 26.0 - 34.0 pg   MCHC 32.9 30.0 - 36.0 g/dL   RDW 08.6 57.8 - 46.9 %   Platelets 278 150 - 400 K/uL   Neutrophils Relative % 86 %   Neutro Abs 13.6 (H) 1.7 - 7.7 K/uL   Lymphocytes Relative 5 %   Lymphs Abs 0.8 0.7 - 4.0 K/uL   Monocytes Relative 9 %   Monocytes Absolute 1.5 (H) 0.1 - 1.0 K/uL   Eosinophils Relative 0 %   Eosinophils Absolute 0.1 0.0 - 0.7 K/uL   Basophils Relative 0 %   Basophils Absolute 0.0 0.0 - 0.1 K/uL  Urinalysis, Routine w reflex microscopic  Result Value Ref Range   Color, Urine BROWN (A) YELLOW   APPearance TURBID (A) CLEAR   Specific Gravity, Urine 1.014 1.005 - 1.030   pH 5.5 5.0 - 8.0   Glucose, UA NEGATIVE NEGATIVE mg/dL   Hgb urine dipstick LARGE (A) NEGATIVE   Bilirubin Urine NEGATIVE NEGATIVE   Ketones, ur NEGATIVE NEGATIVE mg/dL   Protein, ur 629 (A) NEGATIVE mg/dL   Urobilinogen, UA 0.2 0.0 - 1.0 mg/dL   Nitrite NEGATIVE NEGATIVE   Leukocytes, UA SMALL (A) NEGATIVE  Urine microscopic-add on  Result Value Ref Range   Squamous Epithelial / LPF RARE RARE   WBC, UA 21-50 <3 WBC/hpf   RBC / HPF TOO NUMEROUS TO COUNT <3 RBC/hpf    Casts GRANULAR CAST (A) NEGATIVE   Urine-Other AMORPHOUS URATES/PHOSPHATES    Dg Chest Port 1 View 03/28/2015  CLINICAL DATA:  81 year old male with poor p.o. intake and increased white count. EXAM: PORTABLE CHEST 1 VIEW COMPARISON:  03/22/2015 and prior radiographs FINDINGS: The cardiomediastinal silhouette is unremarkable. There is no evidence of focal airspace disease, pulmonary edema,  suspicious pulmonary nodule/mass, pleural effusion, or pneumothorax. No acute bony abnormalities are identified. IMPRESSION: No active disease. Electronically Signed   By: Harmon PierJeffrey  Hu M.D.   On: 03/28/2015 22:12     Results for Kellie SimmeringBROADWAY, Jef R (MRN [id number]) as of 03/28/2015 22:31  Ref. Range 03/22/2015 05:00 03/23/2015 22:15 03/24/2015 04:40 03/25/2015 04:35 03/28/2015 20:23  BUN Latest Ref Range: 6-20 mg/dL 27 (H)  14 23 (H) 65 (H)  Creatinine Latest Ref Range: 0.61-1.24 mg/dL 4.091.03 8.110.96 9.140.99 7.821.94 (H) 2.84 (H)    Results for Kellie SimmeringBROADWAY, Wasyl R (MRN [id number]) as of 03/28/2015 23:59  Ref. Range 03/23/2015 22:15 03/24/2015 04:40 03/25/2015 04:35 03/26/2015 05:52 03/28/2015 20:23  Hemoglobin Latest Ref Range: 13.0-17.0 g/dL 57.810.2 (L) 9.9 (L) 46.910.3 (L) 10.2 (L) 9.5 (L)  HCT Latest Ref Range: 39.0-52.0 % 31.6 (L) 31.0 (L) 31.4 (L) 31.0 (L) 28.9 (L)     2330:  Pt passing blood and clots from urethra, per ED RN after foley attempt. Family states pt "pulled his foley catheter out" 2 days ago "and it bleed a lot then." Concern for urethral injury from this event. #77F coude cath passed easily with large amount urine return. +UTI, UC pending; will dose IV rocephin. Pt appears clinically dehydrated and new ARF on labs; judicious IVF given. H/H lower than previous; T&S ordered. Dx and testing d/w pt's family.  Questions answered.  Verb understanding, agreeable to admit.   T/C to [facility] Dr. Adela Glimpseoutova, case discussed, including:  HPI, pertinent PM/SHx, VS/PE, dx testing, ED course and treatment:  Agreeable to admit, requests to write  temporary orders, obtain tele bed to team [facility].     Samuel JesterKathleen Harrison Zetina, DO 03/31/15 1530

## 2015-03-28 NOTE — ED Notes (Signed)
Patient daughter: Efrain SellaMarie Branson [phone]

## 2015-03-28 NOTE — ED Notes (Signed)
Bed: [id number] Expected date:  Expected time:  Means of arrival:  Comments: EMS 81 yo male from SNF with elevated renal function and WBC

## 2015-03-28 NOTE — ED Notes (Signed)
Called in to place catheter, attempts x 2 prior to this writer with no success.  Small amount blood oozing from meatus, instilled lidocaine jelly and inserted #18 Fr Coude with no problem, large amount urine obtained, dark old bloody sediment noted in tubing

## 2015-03-28 NOTE — ED Notes (Signed)
[facility] has not been eating or drinking today. Pt also has increased WBC, CR, and BUN. Pt has hx fall last week. Pt had right hip surgery Friday. Staff state pt is alert to baseline. Pt has hx of acute renal failure.

## 2015-03-29 ENCOUNTER — Inpatient Hospital Stay (HOSPITAL_COMMUNITY): Payer: Medicare Other

## 2015-03-29 DIAGNOSIS — N179 Acute kidney failure, unspecified: Secondary | ICD-10-CM | POA: Insufficient documentation

## 2015-03-29 DIAGNOSIS — A419 Sepsis, unspecified organism: Secondary | ICD-10-CM | POA: Diagnosis present

## 2015-03-29 DIAGNOSIS — R509 Fever, unspecified: Secondary | ICD-10-CM

## 2015-03-29 DIAGNOSIS — R7989 Other specified abnormal findings of blood chemistry: Secondary | ICD-10-CM

## 2015-03-29 DIAGNOSIS — N1330 Unspecified hydronephrosis: Secondary | ICD-10-CM | POA: Diagnosis present

## 2015-03-29 DIAGNOSIS — Z681 Body mass index (BMI) 19 or less, adult: Secondary | ICD-10-CM | POA: Diagnosis not present

## 2015-03-29 DIAGNOSIS — E876 Hypokalemia: Secondary | ICD-10-CM | POA: Diagnosis present

## 2015-03-29 DIAGNOSIS — F0390 Unspecified dementia without behavioral disturbance: Secondary | ICD-10-CM | POA: Diagnosis not present

## 2015-03-29 DIAGNOSIS — R600 Localized edema: Secondary | ICD-10-CM | POA: Diagnosis present

## 2015-03-29 DIAGNOSIS — E870 Hyperosmolality and hypernatremia: Secondary | ICD-10-CM | POA: Diagnosis present

## 2015-03-29 DIAGNOSIS — I248 Other forms of acute ischemic heart disease: Secondary | ICD-10-CM | POA: Diagnosis present

## 2015-03-29 DIAGNOSIS — Z96641 Presence of right artificial hip joint: Secondary | ICD-10-CM | POA: Diagnosis present

## 2015-03-29 DIAGNOSIS — N390 Urinary tract infection, site not specified: Secondary | ICD-10-CM | POA: Diagnosis present

## 2015-03-29 DIAGNOSIS — Z66 Do not resuscitate: Secondary | ICD-10-CM | POA: Diagnosis not present

## 2015-03-29 DIAGNOSIS — R319 Hematuria, unspecified: Secondary | ICD-10-CM

## 2015-03-29 DIAGNOSIS — D649 Anemia, unspecified: Secondary | ICD-10-CM | POA: Insufficient documentation

## 2015-03-29 DIAGNOSIS — Z7189 Other specified counseling: Secondary | ICD-10-CM | POA: Diagnosis not present

## 2015-03-29 DIAGNOSIS — R627 Adult failure to thrive: Secondary | ICD-10-CM | POA: Diagnosis present

## 2015-03-29 DIAGNOSIS — J181 Lobar pneumonia, unspecified organism: Secondary | ICD-10-CM | POA: Diagnosis present

## 2015-03-29 DIAGNOSIS — D539 Nutritional anemia, unspecified: Secondary | ICD-10-CM | POA: Diagnosis present

## 2015-03-29 DIAGNOSIS — Z515 Encounter for palliative care: Secondary | ICD-10-CM | POA: Diagnosis not present

## 2015-03-29 DIAGNOSIS — E43 Unspecified severe protein-calorie malnutrition: Secondary | ICD-10-CM | POA: Diagnosis present

## 2015-03-29 DIAGNOSIS — Z79891 Long term (current) use of opiate analgesic: Secondary | ICD-10-CM | POA: Diagnosis not present

## 2015-03-29 DIAGNOSIS — D72829 Elevated white blood cell count, unspecified: Secondary | ICD-10-CM

## 2015-03-29 DIAGNOSIS — Z7901 Long term (current) use of anticoagulants: Secondary | ICD-10-CM | POA: Diagnosis not present

## 2015-03-29 DIAGNOSIS — Z79899 Other long term (current) drug therapy: Secondary | ICD-10-CM | POA: Diagnosis not present

## 2015-03-29 DIAGNOSIS — E860 Dehydration: Secondary | ICD-10-CM | POA: Diagnosis present

## 2015-03-29 LAB — BASIC METABOLIC PANEL
ANION GAP: 6 (ref 5–15)
ANION GAP: 5 (ref 5–15)
ANION GAP: 5 (ref 5–15)
ANION GAP: 7 (ref 5–15)
ANION GAP: 6 (ref 5–15)
BUN: 55 mg/dL — ABNORMAL HIGH (ref 6–20)
BUN: 49 mg/dL — ABNORMAL HIGH (ref 6–20)
BUN: 44 mg/dL — ABNORMAL HIGH (ref 6–20)
BUN: 36 mg/dL — ABNORMAL HIGH (ref 6–20)
BUN: 33 mg/dL — ABNORMAL HIGH (ref 6–20)
CALCIUM: 8.3 mg/dL — AB (ref 8.9–10.3)
CALCIUM: 8.4 mg/dL — AB (ref 8.9–10.3)
CALCIUM: 8.4 mg/dL — AB (ref 8.9–10.3)
CALCIUM: 8.5 mg/dL — AB (ref 8.9–10.3)
CALCIUM: 8.3 mg/dL — AB (ref 8.9–10.3)
CHLORIDE: 118 mmol/L — AB (ref 101–111)
CHLORIDE: 118 mmol/L — AB (ref 101–111)
CO2: 26 mmol/L (ref 22–32)
CO2: 27 mmol/L (ref 22–32)
CO2: 27 mmol/L (ref 22–32)
CO2: 27 mmol/L (ref 22–32)
CO2: 28 mmol/L (ref 22–32)
CREATININE: 1.8 mg/dL — AB (ref 0.61–1.24)
CREATININE: 1.58 mg/dL — AB (ref 0.61–1.24)
CREATININE: 1.32 mg/dL — AB (ref 0.61–1.24)
CREATININE: 1.29 mg/dL — AB (ref 0.61–1.24)
Chloride: 118 mmol/L — ABNORMAL HIGH (ref 101–111)
Chloride: 116 mmol/L — ABNORMAL HIGH (ref 101–111)
Chloride: 116 mmol/L — ABNORMAL HIGH (ref 101–111)
Creatinine, Ser: 2.02 mg/dL — ABNORMAL HIGH (ref 0.61–1.24)
GFR calc non Af Amer: 28 mL/min — ABNORMAL LOW (ref >60)
GFR calc non Af Amer: 32 mL/min — ABNORMAL LOW (ref >60)
GFR calc non Af Amer: 37 mL/min — ABNORMAL LOW (ref >60)
GFR, EST AFRICAN AMERICAN: 32 mL/min — AB (ref >60)
GFR, EST AFRICAN AMERICAN: 37 mL/min — AB (ref >60)
GFR, EST AFRICAN AMERICAN: 43 mL/min — AB (ref >60)
GFR, EST AFRICAN AMERICAN: 54 mL/min — AB (ref >60)
GFR, EST AFRICAN AMERICAN: 55 mL/min — AB (ref >60)
GFR, EST NON AFRICAN AMERICAN: 46 mL/min — AB (ref >60)
GFR, EST NON AFRICAN AMERICAN: 48 mL/min — AB (ref >60)
Glucose, Bld: 142 mg/dL — ABNORMAL HIGH (ref 65–99)
Glucose, Bld: 140 mg/dL — ABNORMAL HIGH (ref 65–99)
Glucose, Bld: 144 mg/dL — ABNORMAL HIGH (ref 65–99)
Glucose, Bld: 113 mg/dL — ABNORMAL HIGH (ref 65–99)
Glucose, Bld: 120 mg/dL — ABNORMAL HIGH (ref 65–99)
Potassium: 3.6 mmol/L (ref 3.5–5.1)
Potassium: 3.4 mmol/L — ABNORMAL LOW (ref 3.5–5.1)
Potassium: 3.4 mmol/L — ABNORMAL LOW (ref 3.5–5.1)
Potassium: 3.5 mmol/L (ref 3.5–5.1)
Potassium: 3.3 mmol/L — ABNORMAL LOW (ref 3.5–5.1)
SODIUM: 150 mmol/L — AB (ref 135–145)
SODIUM: 150 mmol/L — AB (ref 135–145)
SODIUM: 150 mmol/L — AB (ref 135–145)
SODIUM: 150 mmol/L — AB (ref 135–145)
SODIUM: 150 mmol/L — AB (ref 135–145)

## 2015-03-29 LAB — TYPE AND SCREEN
ABO/RH(D): O POS
Antibody Screen: NEGATIVE

## 2015-03-29 LAB — OSMOLALITY: OSMOLALITY: 331 mosm/kg — AB (ref 275–300)

## 2015-03-29 LAB — COMPREHENSIVE METABOLIC PANEL
ALBUMIN: 2.5 g/dL — AB (ref 3.5–5.0)
ALK PHOS: 41 U/L (ref 38–126)
ALT: 10 U/L — AB (ref 17–63)
ANION GAP: 5 (ref 5–15)
AST: 35 U/L (ref 15–41)
BILIRUBIN TOTAL: 0.7 mg/dL (ref 0.3–1.2)
BUN: 51 mg/dL — ABNORMAL HIGH (ref 6–20)
CALCIUM: 8.2 mg/dL — AB (ref 8.9–10.3)
CO2: 27 mmol/L (ref 22–32)
CREATININE: 1.83 mg/dL — AB (ref 0.61–1.24)
Chloride: 116 mmol/L — ABNORMAL HIGH (ref 101–111)
GFR calc non Af Amer: 31 mL/min — ABNORMAL LOW (ref >60)
GFR, EST AFRICAN AMERICAN: 36 mL/min — AB (ref >60)
GLUCOSE: 164 mg/dL — AB (ref 65–99)
Potassium: 3.4 mmol/L — ABNORMAL LOW (ref 3.5–5.1)
SODIUM: 148 mmol/L — AB (ref 135–145)
TOTAL PROTEIN: 5.5 g/dL — AB (ref 6.5–8.1)

## 2015-03-29 LAB — CBC
HCT: 25.6 % — ABNORMAL LOW (ref 39.0–52.0)
HCT: 25.2 % — ABNORMAL LOW (ref 39.0–52.0)
Hemoglobin: 8.3 g/dL — ABNORMAL LOW (ref 13.0–17.0)
Hemoglobin: 8.1 g/dL — ABNORMAL LOW (ref 13.0–17.0)
MCH: 33.1 pg (ref 26.0–34.0)
MCH: 32.9 pg (ref 26.0–34.0)
MCHC: 32.4 g/dL (ref 30.0–36.0)
MCHC: 32.1 g/dL (ref 30.0–36.0)
MCV: 102 fL — ABNORMAL HIGH (ref 78.0–100.0)
MCV: 102.4 fL — ABNORMAL HIGH (ref 78.0–100.0)
PLATELETS: 217 10*3/uL (ref 150–400)
Platelets: 222 10*3/uL (ref 150–400)
RBC: 2.51 MIL/uL — AB (ref 4.22–5.81)
RBC: 2.46 MIL/uL — ABNORMAL LOW (ref 4.22–5.81)
RDW: 13.1 % (ref 11.5–15.5)
RDW: 13.1 % (ref 11.5–15.5)
WBC: 12.1 10*3/uL — AB (ref 4.0–10.5)
WBC: 9.8 10*3/uL (ref 4.0–10.5)

## 2015-03-29 LAB — PHOSPHORUS
Phosphorus: 3.2 mg/dL (ref 2.5–4.6)
Phosphorus: 2.7 mg/dL (ref 2.5–4.6)

## 2015-03-29 LAB — SODIUM, URINE, RANDOM: SODIUM UR: 52 mmol/L

## 2015-03-29 LAB — MAGNESIUM
MAGNESIUM: 2.3 mg/dL (ref 1.7–2.4)
Magnesium: 2.4 mg/dL (ref 1.7–2.4)

## 2015-03-29 LAB — MRSA PCR SCREENING: MRSA BY PCR: NEGATIVE

## 2015-03-29 LAB — TSH: TSH: 1.281 u[IU]/mL (ref 0.350–4.500)

## 2015-03-29 LAB — OSMOLALITY, URINE: Osmolality, Ur: 461 mOsm/kg (ref 390–1090)

## 2015-03-29 LAB — TROPONIN I
TROPONIN I: 0.06 ng/mL — AB (ref <0.031)
TROPONIN I: 0.06 ng/mL — AB (ref <0.031)
TROPONIN I: 0.05 ng/mL — AB (ref <0.031)

## 2015-03-29 LAB — CREATININE, URINE, RANDOM: CREATININE, URINE: 75.99 mg/dL

## 2015-03-29 MED ORDER — VANCOMYCIN HCL 500 MG IV SOLR
500.0000 mg | INTRAVENOUS | Status: DC
  Administered 2015-03-30: 500 mg via INTRAVENOUS
  Filled 2015-03-29: qty 500

## 2015-03-29 MED ORDER — ENSURE ENLIVE PO LIQD
237.0000 mL | Freq: Two times a day (BID) | ORAL | Status: DC
  Administered 2015-03-30 – 2015-04-02 (×2): 237 mL via ORAL

## 2015-03-29 MED ORDER — PIPERACILLIN-TAZOBACTAM 3.375 G IVPB 30 MIN
3.3750 g | Freq: Once | INTRAVENOUS | Status: AC
  Administered 2015-03-29: 3.375 g via INTRAVENOUS
  Filled 2015-03-29: qty 50

## 2015-03-29 MED ORDER — ACETAMINOPHEN 650 MG RE SUPP: 650.0000 mg | Freq: Four times a day (QID) | RECTAL | Status: DC | PRN

## 2015-03-29 MED ORDER — ACETAMINOPHEN 325 MG PO TABS: 650.0000 mg | ORAL_TABLET | Freq: Four times a day (QID) | ORAL | Status: DC | PRN

## 2015-03-29 MED ORDER — PIPERACILLIN-TAZOBACTAM 3.375 G IVPB
3.3750 g | Freq: Three times a day (TID) | INTRAVENOUS | Status: DC
  Administered 2015-03-29 – 2015-04-02 (×12): 3.375 g via INTRAVENOUS
  Filled 2015-03-29 (×14): qty 50

## 2015-03-29 MED ORDER — FOLIC ACID 5 MG/ML IJ SOLN
1.0000 mg | Freq: Every day | INTRAMUSCULAR | Status: DC
  Administered 2015-03-29 – 2015-04-02 (×5): 1 mg via INTRAVENOUS
  Filled 2015-03-29 (×5): qty 0.2

## 2015-03-29 MED ORDER — ONDANSETRON HCL 4 MG PO TABS: 4.0000 mg | ORAL_TABLET | Freq: Four times a day (QID) | ORAL | Status: DC | PRN

## 2015-03-29 MED ORDER — THIAMINE HCL 100 MG/ML IJ SOLN
100.0000 mg | Freq: Every day | INTRAMUSCULAR | Status: DC
  Administered 2015-03-29 – 2015-04-02 (×5): 100 mg via INTRAVENOUS
  Filled 2015-03-29 (×6): qty 1

## 2015-03-29 MED ORDER — SODIUM CHLORIDE 0.9 % IV SOLN
INTRAVENOUS | Status: DC
Start: 2015-03-29 — End: 2015-03-29

## 2015-03-29 MED ORDER — CETYLPYRIDINIUM CHLORIDE 0.05 % MT LIQD
7.0000 mL | Freq: Two times a day (BID) | OROMUCOSAL | Status: DC
  Administered 2015-03-29 – 2015-04-02 (×5): 7 mL via OROMUCOSAL

## 2015-03-29 MED ORDER — CHLORHEXIDINE GLUCONATE 0.12 % MT SOLN
15.0000 mL | Freq: Two times a day (BID) | OROMUCOSAL | Status: DC
  Administered 2015-03-29 – 2015-04-02 (×8): 15 mL via OROMUCOSAL
  Filled 2015-03-29 (×9): qty 15

## 2015-03-29 MED ORDER — HYDROCODONE-ACETAMINOPHEN 5-325 MG PO TABS: 1.0000 | ORAL_TABLET | ORAL | Status: DC | PRN

## 2015-03-29 MED ORDER — PANTOPRAZOLE SODIUM 40 MG IV SOLR
40.0000 mg | Freq: Every day | INTRAVENOUS | Status: DC
  Administered 2015-03-29 – 2015-04-01 (×5): 40 mg via INTRAVENOUS
  Filled 2015-03-29 (×5): qty 40

## 2015-03-29 MED ORDER — VANCOMYCIN HCL IN DEXTROSE 1-5 GM/200ML-% IV SOLN
1000.0000 mg | Freq: Once | INTRAVENOUS | Status: AC
  Administered 2015-03-29: 1000 mg via INTRAVENOUS
  Filled 2015-03-29: qty 200

## 2015-03-29 MED ORDER — DEXTROSE-NACL 5-0.45 % IV SOLN
INTRAVENOUS | Status: DC
  Administered 2015-03-29 (×2): via INTRAVENOUS

## 2015-03-29 MED ORDER — DEXTROSE 5 % IV SOLN
INTRAVENOUS | Status: DC
  Administered 2015-03-29: 100 mL via INTRAVENOUS
  Administered 2015-03-29: 05:00:00 via INTRAVENOUS

## 2015-03-29 MED ORDER — ONDANSETRON HCL 4 MG/2ML IJ SOLN: 4.0000 mg | Freq: Four times a day (QID) | INTRAMUSCULAR | Status: DC | PRN

## 2015-03-29 MED ORDER — ENOXAPARIN SODIUM 30 MG/0.3ML ~~LOC~~ SOLN
30.0000 mg | SUBCUTANEOUS | Status: DC
  Administered 2015-03-29 – 2015-04-02 (×5): 30 mg via SUBCUTANEOUS
  Filled 2015-03-29 (×5): qty 0.3

## 2015-03-29 MED ORDER — SODIUM CHLORIDE 0.9 % IJ SOLN
3.0000 mL | Freq: Two times a day (BID) | INTRAMUSCULAR | Status: DC
  Administered 2015-03-29 – 2015-04-02 (×3): 3 mL via INTRAVENOUS

## 2015-03-29 NOTE — Evaluation (Signed)
Physical Therapy Evaluation Patient Details Name: Wayne SimmeringJohn R Schroeder MRN: [id number] DOB: December 05, 1924 Today's Date: 03/29/2015   History of Present Illness  81 year old male from skilled nursing facility with past medical history significant for dementia, recent hospitalization for right hip fracture status post right hemiarthroplasty with direct anterior approach 03/23/15 in hospital course complicated with development of pneumonia.  Pt readmitted 03/28/15 due to sepsis secondary to UTI  Clinical Impression  Pt admitted with above diagnosis. Pt currently with functional limitations due to the deficits listed below (see PT Problem List).  Pt will benefit from skilled PT to increase their independence and safety with mobility to allow discharge to the venue listed below.   Pt assisted to standing twice however not picking up feet well to ambulate yet.  Family reports plan is for pt to return to SNF for rehab.  RN encouraged to assist pt OOB using stedy or lift.     Follow Up Recommendations SNF    Equipment Recommendations  None recommended by PT    Recommendations for Other Services       Precautions / Restrictions Precautions Precautions: Fall Precaution Comments: dementia, R hip hemiarthroplasty direct anterior approach - per last admission Restrictions Other Position/Activity Restrictions: WBAT per previous admission      Mobility  Bed Mobility Overal bed mobility: Needs Assistance;+2 for physical assistance Bed Mobility: Supine to Sit;Sit to Supine     Supine to sit: Max assist;+2 for physical assistance Sit to supine: Total assist;+2 for physical assistance   General bed mobility comments: pt initiating movement however requiring increased assist to complete mobility  Transfers Overall transfer level: Needs assistance Equipment used: 2 person hand held assist Transfers: Sit to/from Stand Sit to Stand: Max assist;+2 physical assistance;+2 safety/equipment          General transfer comment: multimodal cues for technique, 2 HHA as pt in mittens, upon standing min-mod assist for stability, pt had difficulty attempting to step up [facility], performed x2   Ambulation/Gait                Stairs            Wheelchair Mobility    Modified Rankin (Stroke Patients Only)       Balance Overall balance assessment: Needs assistance Sitting-balance support: Bilateral upper extremity supported;Feet supported Sitting balance-Leahy Scale: Poor     Standing balance support: Bilateral upper extremity supported;During functional activity Standing balance-Leahy Scale: Poor                               Pertinent Vitals/Pain Pain Assessment: Faces Faces Pain Scale: Hurts even more Pain Location: R hip area Pain Descriptors / Indicators: Grimacing Pain Intervention(s): Monitored during session;Limited activity within patient's tolerance    Home Living Family/patient expects to be discharged to:: Skilled nursing facility                      Prior Function Level of Independence: Needs assistance         Comments: ambulatory  in home prior to hip fx with R hip hemi recently, not yet ambulating in SNF per family     Hand Dominance        Extremity/Trunk Assessment   Upper Extremity Assessment: Generalized weakness           Lower Extremity Assessment: Generalized weakness RLE Deficits / Details: will move R LE against gravity however not full range  Communication   Communication: Receptive difficulties;Expressive difficulties (hx dementia)  Cognition Arousal/Alertness: Awake/alert Behavior During Therapy: WFL for tasks assessed/performed Overall Cognitive Status: Impaired/Different from baseline Area of Impairment: Following commands       Following Commands: Follows one step commands with increased time       General Comments: family reports pt not at baseline, improved talking during session  while performing mobility, nonsensical at times    General Comments      Exercises        Assessment/Plan    PT Assessment Patient needs continued PT services  PT Diagnosis Difficulty walking;Altered mental status   PT Problem List Decreased strength;Decreased range of motion;Decreased activity tolerance;Decreased balance;Decreased mobility;Decreased cognition;Decreased knowledge of use of DME;Decreased safety awareness;Decreased knowledge of precautions  PT Treatment Interventions DME instruction;Gait training;Functional mobility training;Therapeutic activities;Therapeutic exercise;Balance training;Patient/family education   PT Goals (Current goals can be found in the Care Plan section) Acute Rehab PT Goals PT Goal Formulation: With patient/family Time For Goal Achievement: 04/12/15 Potential to Achieve Goals: Fair    Frequency Min 3X/week   Barriers to discharge        Co-evaluation               End of Session Equipment Utilized During Treatment: Gait belt Activity Tolerance: Patient tolerated treatment well Patient left: with call bell/phone within reach;with bed alarm set;in bed;with family/visitor present Nurse Communication: Mobility status         Time: 4132-4401 PT Time Calculation (min) (ACUTE ONLY): 24 min   Charges:   PT Evaluation $Initial PT Evaluation Tier I: 1 Procedure     PT G Codes:        Wayne Schroeder,Wayne Schroeder 03/29/2015, 4:01 PM Wayne Schroeder, PT, DPT 03/29/2015 Pager: [phone]

## 2015-03-29 NOTE — Clinical Social Work Note (Signed)
Clinical Social Work Assessment  Patient Details  Name: Wayne Schroeder MRN: [id number] Date of Birth: 10-07-1924  Date of referral:  03/29/15               Reason for consult:  Facility Placement                Permission sought to share information with:  Family Supports Permission granted to share information::  No  Name::        Agency::     Relationship::     Contact Information:     Housing/Transportation Living arrangements for the past 2 months:  [place] of Information:  Spouse Patient Interpreter Needed:  None Criminal Activity/Legal Involvement Pertinent to Current Situation/Hospitalization:  No - Comment as needed Significant Relationships:  Spouse Lives with:  Facility Resident Do you feel safe going back to the place where you live?  Yes Need for family participation in patient care:  Yes (Comment)  Care giving concerns:  CSW met with patient (advanced dementia), wife of 29 years and son-in-law at bedside. Per wife, he was placed at [facility] on Monday for rehab s/p hip surgery. SHe has been pleased with the care at SNF and wants him to return at dc. "It's a very nice place and we are happy with the facility and care".    Social Worker assessment / plan:  CSW will update FL2 and update [facility] liason- plan will be for return once medically stable.   Employment status:  Retired Forensic scientist:  [employer] PT Recommendations:  [place] / Referral to community resources:  [place]  Patient/Family's Response to care:  Wife appreciaitive of the care he is receiving here in hospital- She reports seeing some improvement today.   Patient/Family's Understanding of and Emotional Response to Diagnosis, Current Treatment, and Prognosis:   Wife understands plan for swallow test and PT to see him (she is eager for him to get up with PT so he doesn't lose the progress he has made thus far after the  hip surgery.   She is hopeful to get him home after SNF rehab and care for him as she did prior to the hip surgery.  Wife reports good support form friends and family-  Emotional Assessment Appearance:  Appears stated age Attitude/Demeanor/Rapport:  Unable to Assess Affect (typically observed):  Unable to Assess Orientation:  Fluctuating Orientation (Suspected and/or reported Sundowners) Alcohol / Substance use:  Not Applicable Psych involvement (Current and /or in the community):  No (Comment)  Discharge Needs  Concerns to be addressed:  Discharge Planning Concerns Readmission within the last 30 days:  Yes Current discharge risk:  None Barriers to Discharge:  No Barriers Identified   Ludwig Clarks, LCSW 03/29/2015, 3:27 PM

## 2015-03-29 NOTE — Evaluation (Signed)
Clinical/Bedside Swallow Evaluation Patient Details  Name: Wayne SimmeringJohn R Schroeder MRN: [id number] Date of Birth: 11-Jan-1925  Today's Date: 03/29/2015 Time: SLP Start Time (ACUTE ONLY): 1041 SLP Stop Time (ACUTE ONLY): 1050 SLP Time Calculation (min) (ACUTE ONLY): 9 min  Past Medical History:  Past Medical History  Diagnosis Date  . Dementia    Past Surgical History:  Past Surgical History  Procedure Laterality Date  . Back surgery    . Total hip arthroplasty Right    HPI:  81 year old male with h/o dementia, admitted from SNF with poor by mouth intake, UTI.  Recent admission for closed right hip fracture post right hemearthroplasty on 10/7, d/c on 10/10. Hopsital stay complicated by PNA. Current CXR without acute disease. Previous bedside swallowing evaluation 10/8 recommended dysphagia 1 with nectar thick liquid to decrease aspiration risk with previously altered mentation increasing aspiration risk.     Assessment / Plan / Recommendation Clinical Impression  Patient presents with AMS characterized by lethargy, poor awareness, and attention impacting swallowing abilities at this time. Reduced labial and lingual ROM noted resulting in significant dysarthria. Max verbal cues provided to open oral cavity for both oral care and acceptance of po trials which were ultimately held orally with little-no oral manipulation and eventual delayed swallow initiation. Patient unsafe for pos at this time. Prognosis for ability to resume a po diet good with improved mentation. Will f/u closely.     Aspiration Risk  Severe    Diet Recommendation NPO   Medication Administration: Via alternative means    Other  Recommendations Oral Care Recommendations: Oral care QID         Pertinent Vitals/Pain N/a        Swallow Study    General Other Pertinent Information: 81 year old male with h/o dementia, admitted from SNF with poor by mouth intake, UTI.  Recent admission for closed right hip fracture post  right hemearthroplasty on 10/7, d/c on 10/10. Hopsital stay complicated by PNA. Current CXR without acute disease. Previous bedside swallowing evaluation 10/8 recommended dysphagia 1 with nectar thick liquid to decrease aspiration risk with previously altered mentation increasing aspiration risk.   Type of Study: Bedside swallow evaluation Previous Swallow Assessment: see HPI Diet Prior to this Study: NPO Temperature Spikes Noted: Yes Respiratory Status: Room air History of Recent Intubation: No Behavior/Cognition: Lethargic/Drowsy;Confused;Distractible;Requires cueing;Doesn't follow directions Oral Cavity - Dentition: Poor condition Self-Feeding Abilities: Total assist Patient Positioning: Upright in bed Baseline Vocal Quality: Wet Volitional Cough: Cognitively unable to elicit Volitional Swallow: Unable to elicit    Oral/Motor/Sensory Function Overall Oral Motor/Sensory Function: Impaired (generalized weakness, decreased labial/lingual ROM)   Ice Chips Ice chips: Impaired Presentation: Spoon Oral Phase Impairments: Reduced labial seal;Reduced lingual movement/coordination;Impaired anterior to posterior transit;Impaired mastication;Poor awareness of bolus Pharyngeal Phase Impairments: Suspected delayed Swallow   Thin Liquid Thin Liquid: Not tested    Nectar Thick Nectar Thick Liquid: Not tested   Honey Thick Honey Thick Liquid: Not tested   Puree Puree: Not tested   Solid   GO   Wayne Fouch MA, CCC-SLP [phone] Solid: Not tested       Wayne Schroeder Wayne Schroeder 03/29/2015,10:52 AM

## 2015-03-29 NOTE — ED Notes (Signed)
Lab at bedside to collect blood cultures.

## 2015-03-29 NOTE — Progress Notes (Signed)
Patient ID: Wayne Schroeder, male   DOB: [birth year], 81 y.o.   MRN: [id number] [facility] PROGRESS NOTE  Wayne Schroeder WHQ:[id number] DOB: 10-04-1924 DOA: 03/28/2015 PCP: Marton Redwood, MD  Brief narrative:    81 year old male from skilled nursing facility with past medical history significant for dementia, recent hospitalization for right hip fracture status post right hemiarthroplasty in hospital course complicated with development of pneumonia. He presented from skilled nursing facility with poor by mouth intake. His blood work demonstrated increased white blood cell count and rising creatinine. On admission, patient was stable. His blood work was significant for white blood cell count of 15.9, hemoglobin 9.5, sodium of 150, creatinine 2.02 and elevated troponin at 0.06. Chest x-ray did not demonstrate active disease. Renal ultrasound demonstrated mild right hydronephrosis. Urinalysis revealed small leukocytes and 21-50 white blood cells along with numerous red blood cells. Patient was started on empiric antibiotics, vancomycin and Zosyn.  Anticipated discharge: We will obtain physical therapy evaluation along with palliative care consultation for goals of care. For now we will continue vancomycin and Zosyn. Anticipated discharge to skilled nursing facility likely by Monday, 04/02/2015.  Assessment/Plan:    Principal problem: Sepsis secondary to urinary tract infection / leukocytosis - Sepsis criteria met on the admission with fever, tachycardia, tachypnea and leukocytosis. Source of infection suspected urinary tract infection considering urinalysis on the admission demonstrated leukocytes and white blood cells. No acute cardiopulmonary findings seen on chest x-ray. - Patient is on broad-spectrum antibiotics, vancomycin and Zosyn. - Blood cultures and urine culture pending.  - Patient is hemodynamically stable, does not require pressor support.  Active Problems:   Dementia without  behavioral disturbance  - Stable  - Palliative consulted for goals of care    Elevated troponin - Likely demand ischemia from sepsis - Troponin level plateaued at 0.06  - Would not initiate aspirin at this time because of hematuria.     Macrocytic anemia - Hemoglobin 8.1. Noted to have red blood cells on urinalysis. - Continue to monitor daily CBC    ARF (acute renal failure) (HCC) - Likely from sepsis etiology  - Patient does have mild hydronephrosis seen on renal ultrasound but he is producing urine  - Continue to monitor renal function, creatinine already improving with fluids.     Hypernatremia - Likely secondary to dehydration  - Continue IV fluids, D5 water at 100 mL an hour   DVT Prophylaxis  - Lovenox subcutaneous ordered.  Code Status: Full.  Family Communication:  Family not at the bedside Disposition Plan: Needs physical therapy evaluation  IV access:  Peripheral IV  Procedures and diagnostic studies:    Dg Chest 2 View 03/29/2015   No active cardiopulmonary disease. Electronically Signed   By: Kathreen Devoid   On: 03/29/2015 09:50   US Renal 03/29/2015  Mild right hydronephrosis. Electronically Signed   By: Lovey Newcomer M.D.   On: 03/29/2015 11:01   Dg Chest Port 1 View 03/28/2015  No active disease. Electronically Signed   By: Margarette Canada M.D.   On: 03/28/2015 22:12    Medical Consultants:  Palliative care team  Other Consultants:  Physical therapy Nutrition  IAnti-Infectives:   Vancomycin 03/28/2015 --> Zosyn 03/28/2015 -->   Leisa Lenz, MD  [facility] Pager [phone]  Time spent in minutes: 25 minutes  If 7PM-7AM, please contact night-coverage [email or web address] Password [email or web address] 03/29/2015, 11:09 AM   LOS: 0 days    HPI/Subjective: No acute overnight events. No respiratory distress.  Objective:  Filed Vitals:   03/28/15 2327 03/29/15 0000 03/29/15 0126 03/29/15 0533  BP: 146/78 147/74 144/65 142/63  Pulse: 92 90 68 67  Temp:   98.7  F (37.1 C) 98.2 F (36.8 C)  TempSrc:    Oral  Resp: '18 23 20 20  ' Height:   '5\' 8"'  (1.727 m)   SpO2: 100% 100% 99% 99%    Intake/Output Summary (Last 24 hours) at 03/29/15 1109 Last data filed at 03/29/15 0910  Gross per 24 hour  Intake 246.67 ml  Output   2175 ml  Net -1928.33 ml    Exam:   General:  Pt is not in acute distress, has mittens on   Cardiovascular: Regular rate and rhythm, S1/S2 (+)  Respiratory: Clear to auscultation bilaterally, no wheezing, no crackles, no rhonchi  Abdomen: Soft, non tender, non distended, bowel sounds present  Extremities: No edema, pulses DP and PT palpable bilaterally  Neuro: Grossly nonfocal  Data Reviewed: Basic Metabolic Panel:  Recent Labs Lab 03/25/15 0435 03/28/15 2023 03/29/15 0258 03/29/15 0545 03/29/15 0913  NA 143 150* 150* 148* 150*  K 3.8 4.1 3.6 3.4* 3.4*  CL 109 116* 118* 116* 118*  CO2 '25 26 26 27 27  ' GLUCOSE 111* 122* 142* 164* 140*  BUN 23* 65* 55* 51* 49*  CREATININE 1.94* 2.84* 2.02* 1.83* 1.80*  CALCIUM 8.5* 9.1 8.3* 8.2* 8.4*  MG  --   --  2.4 2.3  --   PHOS  --   --  3.2 2.7  --    Liver Function Tests:  Recent Labs Lab 03/28/15 2023 03/29/15 0545  AST 47* 35  ALT 12* 10*  ALKPHOS 53 41  BILITOT 0.7 0.7  PROT 6.5 5.5*  ALBUMIN 3.1* 2.5*   No results for input(s): LIPASE, AMYLASE in the last 168 hours. No results for input(s): AMMONIA in the last 168 hours. CBC:  Recent Labs Lab 03/25/15 0435 03/26/15 0552 03/28/15 2023 03/29/15 0258 03/29/15 0545  WBC 10.7* 13.1* 15.9* 12.1* 9.8  NEUTROABS  --   --  13.6*  --   --   HGB 10.3* 10.2* 9.5* 8.3* 8.1*  HCT 31.4* 31.0* 28.9* 25.6* 25.2*  MCV 100.6* 101.6* 102.1* 102.0* 102.4*  PLT 202 229 278 222 217   Cardiac Enzymes:  Recent Labs Lab 03/28/15 2023 03/29/15 0258 03/29/15 0545  TROPONINI 0.05* 0.06* 0.06*   BNP: Invalid input(s): POCBNP CBG: No results for input(s): GLUCAP in the last 168 hours.  Recent Results (from  the past 240 hour(s))  Surgical pcr screen     Status: None   Collection Time: 03/22/15  7:54 AM  Result Value Ref Range Status   MRSA, PCR NEGATIVE NEGATIVE Final   Staphylococcus aureus NEGATIVE NEGATIVE Final    Comment:        The Xpert SA Assay (FDA approved for NASAL specimens in patients over 43 years of age), is one component of a comprehensive surveillance program.  Test performance has been validated by [facility] for patients greater than or equal to 56 year old. It is not intended to diagnose infection nor to guide or monitor treatment.   MRSA PCR Screening     Status: None   Collection Time: 03/29/15  2:59 AM  Result Value Ref Range Status   MRSA by PCR NEGATIVE NEGATIVE Final    Comment:        The GeneXpert MRSA Assay (FDA approved for NASAL specimens only), is one component of a comprehensive MRSA  colonization surveillance program. It is not intended to diagnose MRSA infection nor to guide or monitor treatment for MRSA infections.      Scheduled Meds: . antiseptic oral rinse  7 mL Mouth Rinse q12n4p  . chlorhexidine  15 mL Mouth Rinse BID  . enoxaparin (LOVENOX) injection  30 mg Subcutaneous Q24H  . feeding supplement (ENSURE ENLIVE)  237 mL Oral BID BM  . folic acid  1 mg Intravenous Daily  . pantoprazole (PROTONIX) IV  40 mg Intravenous QHS  . piperacillin-tazobactam (ZOSYN)  IV  3.375 g Intravenous 3 times per day  . sodium chloride  3 mL Intravenous Q12H  . thiamine  100 mg Intravenous Daily  . [START ON 03/30/2015] vancomycin  500 mg Intravenous Q24H   Continuous Infusions: . dextrose 100 mL/hr at 03/29/15 0432

## 2015-03-29 NOTE — ED Notes (Signed)
Family at bedside. 

## 2015-03-29 NOTE — Progress Notes (Signed)
Echocardiogram 2D Echocardiogram has been performed.  Dorothey BasemanReel, Wayden Schwertner M 03/29/2015, 12:52 PM

## 2015-03-29 NOTE — ED Notes (Signed)
MD at bedside; admitting

## 2015-03-29 NOTE — Care Management Note (Signed)
Case Management Note  Patient Details  Name: Kellie SimmeringJohn R Odenthal MRN: [id number] Date of Birth: 12-Jul-1924  Subjective/Objective:   81 y/o m admitted w/UTI. Readmit-10/5-10/10-L hip hemi.From SNF. PT cons.CSW already following.                 Action/Plan:d/c plan return SNF   Expected Discharge Date:                  Expected Discharge Plan:  Skilled Nursing Facility  In-House Referral:     Discharge planning Services  CM Consult  Post Acute Care Choice:    Choice offered to:     DME Arranged:    DME Agency:     HH Arranged:    HH Agency:     Status of Service:  In process, will continue to follow  Medicare Important Message Given:    Date Medicare IM Given:    Medicare IM give by:    Date Additional Medicare IM Given:    Additional Medicare Important Message give by:     If discussed at Long Length of Stay Meetings, dates discussed:    Additional Comments:  Lanier ClamMahabir, Eshawn Coor, RN 03/29/2015, 2:12 PM

## 2015-03-29 NOTE — Progress Notes (Signed)
ANTIBIOTIC CONSULT NOTE - INITIAL  Pharmacy Consult for Vancomycin and Zosyn  Indication: Sepsis  No Known Allergies  Patient Measurements: Height: 5\' 8"  (172.7 cm) IBW/kg (Calculated) : 68.4 Adjusted Body Weight:   Vital Signs: Temp: 98.7 F (37.1 C) (10/13 0126) Temp Source: Rectal (10/12 2020) BP: 144/65 mmHg (10/13 0126) Pulse Rate: 68 (10/13 0126) Intake/Output from previous day: 10/12 0701 - 10/13 0700 In: -  Out: 800 [Urine:800] Intake/Output from this shift: Total I/O In: -  Out: 800 [Urine:800]  Labs:  Recent Labs  03/26/15 0552 03/28/15 2023 03/29/15 0258  WBC 13.1* 15.9* 12.1*  HGB 10.2* 9.5* 8.3*  PLT 229 278 222  CREATININE  --  2.84* 2.02*   Estimated Creatinine Clearance: 21.3 mL/min (by C-G formula based on Cr of 2.02). No results for input(s): VANCOTROUGH, VANCOPEAK, VANCORANDOM, GENTTROUGH, GENTPEAK, GENTRANDOM, TOBRATROUGH, TOBRAPEAK, TOBRARND, AMIKACINPEAK, AMIKACINTROU, AMIKACIN in the last 72 hours.   Microbiology: Recent Results (from the past 720 hour(s))  Surgical pcr screen     Status: None   Collection Time: 03/22/15  7:54 AM  Result Value Ref Range Status   MRSA, PCR NEGATIVE NEGATIVE Final   Staphylococcus aureus NEGATIVE NEGATIVE Final    Comment:        The Xpert SA Assay (FDA approved for NASAL specimens in patients over 79 years of age), is one component of a comprehensive surveillance program.  Test performance has been validated by [facility] for patients greater than or equal to 79 year old. It is not intended to diagnose infection nor to guide or monitor treatment.     Medical History: Past Medical History  Diagnosis Date  . Dementia     Medications:  Anti-infectives    Start     Dose/Rate Route Frequency Ordered Stop   03/30/15 0600  vancomycin (VANCOCIN) 500 mg in sodium chloride 0.9 % 100 mL IVPB     500 mg 100 mL/hr over 60 Minutes Intravenous Every 24 hours 03/29/15 0441     03/29/15 1400   piperacillin-tazobactam (ZOSYN) IVPB 3.375 g     3.375 g 12.5 mL/hr over 240 Minutes Intravenous 3 times per day 03/29/15 0441     03/29/15 0230  vancomycin (VANCOCIN) IVPB 1000 mg/200 mL premix     1,000 mg 200 mL/hr over 60 Minutes Intravenous  Once 03/29/15 0224 03/29/15 0351   03/29/15 0230  piperacillin-tazobactam (ZOSYN) IVPB 3.375 g     3.375 g 100 mL/hr over 30 Minutes Intravenous  Once 03/29/15 0224 03/29/15 0320   03/28/15 2330  cefTRIAXone (ROCEPHIN) 1 g in dextrose 5 % 50 mL IVPB     1 g 100 mL/hr over 30 Minutes Intravenous  Once 03/28/15 2316 03/29/15 0055     Assessment: Patient with sepsis.  Renal function is reduced from baseline.  First dose of antibiotics already given in ED.  Goal of Therapy:  Vancomycin trough level 15-20 mcg/ml  Zosyn based on renal function  Appropriate antibiotic dosing for renal function; eradication of infection   Plan:  Measure antibiotic drug levels at steady state Follow up culture results Vancomycin 500mg  iv q24hr  Zosyn 3.375g IV Q8H infused over 4hrs.   Darlina GuysGrimsley Jr, Jacquenette ShoneJulian Crowford 03/29/2015,4:46 AM

## 2015-03-30 DIAGNOSIS — E43 Unspecified severe protein-calorie malnutrition: Secondary | ICD-10-CM | POA: Diagnosis present

## 2015-03-30 LAB — URINE CULTURE: Culture: NO GROWTH

## 2015-03-30 MED ORDER — SODIUM CHLORIDE 0.9 % IV SOLN
INTRAVENOUS | Status: DC
  Administered 2015-03-31 – 2015-04-01 (×2): via INTRAVENOUS

## 2015-03-30 MED ORDER — SODIUM CHLORIDE 0.9 % IV SOLN
INTRAVENOUS | Status: DC
  Administered 2015-03-30: 13:00:00 via INTRAVENOUS

## 2015-03-30 MED ORDER — VANCOMYCIN HCL IN DEXTROSE 750-5 MG/150ML-% IV SOLN: 750.0000 mg | INTRAVENOUS | Status: DC

## 2015-03-30 MED ORDER — POTASSIUM CHLORIDE CRYS ER 20 MEQ PO TBCR
40.0000 meq | EXTENDED_RELEASE_TABLET | Freq: Once | ORAL | Status: AC
  Administered 2015-03-30: 40 meq via ORAL
  Filled 2015-03-30: qty 2

## 2015-03-30 NOTE — Progress Notes (Signed)
Initial Nutrition Assessment  DOCUMENTATION CODES:   Severe malnutrition in context of chronic illness, Underweight  INTERVENTION:  - Will monitor for GOC  - Should TF be warranted, recommend Jevity 1.2 @ 55 mL/hr which provides 1584 kcal, 73 grams protein, and 1065 mL free water. - RD will continue to monitor for needs  NUTRITION DIAGNOSIS:   Inadequate oral intake related to inability to eat as evidenced by NPO status.  GOAL:   Patient will meet greater than or equal to 90% of their needs  MONITOR:   Diet advancement, Weight trends, Labs, Skin, I & O's  REASON FOR ASSESSMENT:   Malnutrition Screening Tool, Consult Diet education  ASSESSMENT:   Patient with Known history of dementia family states that prior to the fall he was able to ambulate and get himself dressed but he was basically nonverbal secondary to severe dementia. Pateitn Have had recent admition for closed right hip fracture status post right hemearthroplasty on October 7 was discharged on 10 follow October, at baseline poorly verbal. [facility] on Lovenox 40 mg for DVT prophylaxis. His hospital stay was complicated by lobar pneumonia which was treated initially by Rocephin and azithromycin and was discharged on Augmentin he supposed to take for 5 more days. there was some question whether this was aspiration related   Pt seen for MST and consult. BMI indicates underweight status. Pt unable to provide information at time of visit and no family or visitors present at this time. Pt with bilateral mitten restraints in place.   SLP saw pt yesterday (10/13) and recommended NPO status. Should TF be within GOC, recommendations have been left above. He is unable to meet needs at this time.   Moderate and severe muscle and severe fat wasting noted. Per chart review, pt lost 11 lbs (8% body weight) in the past 8 days which is significant for time frame. Will need to continue to monitor weight trends.  This may be partly fluid related as previous weight is from time of hip surgery.   Medications reviewed. Labs reviewed; Na: 150 mmol/L, K: 3.3 mmol/L, Cl: 116 mmol/L, BUN/creatinine elevated and trending up, Ca: 8.3 mg/dL, GFR: 48.    Diet Order:  Diet NPO time specified  Skin:  Wound (see comment) (R hip incision from 03/23/15)  Last BM:  10/10  Height:   Ht Readings from Last 1 Encounters:  03/29/15 5\' 8"  (1.727 m)    Weight:   Wt Readings from Last 1 Encounters:  03/29/15 120 lb (54.432 kg)    Ideal Body Weight:  70 kg (kg)  BMI:  Body mass index is 18.25 kg/(m^2).  Estimated Nutritional Needs:   Kcal:  1500-1700  Protein:  65-75 grams  Fluid:  2-2.2 L/day  EDUCATION NEEDS:   No education needs identified at this time     Trenton GammonJessica Letrell Attwood, RD, LDN Inpatient Clinical Dietitian Pager # [phone] After hours/weekend pager # [phone]

## 2015-03-30 NOTE — Progress Notes (Addendum)
Patient ID: Wayne Schroeder, male   DOB: 30-Nov-1924, 81 y.o.   MRN: [id number] [facility] PROGRESS NOTE  JORJE VANATTA TUU:[id number] DOB: Dec 08, 1924 DOA: 03/28/2015 PCP: Marton Redwood, MD  Brief narrative:    81 year old male from skilled nursing facility with past medical history significant for dementia, recent hospitalization for right hip fracture status post right hemiarthroplasty in hospital course complicated with development of pneumonia. He presented from skilled nursing facility with poor by mouth intake. His blood work demonstrated increased white blood cell count and rising creatinine.  On admission, patient was stable. His blood work was significant for white blood cell count of 15.9, hemoglobin 9.5, sodium of 150, creatinine 2.02 and elevated troponin at 0.06. Chest x-ray did not demonstrate active disease. Renal ultrasound demonstrated mild right hydronephrosis. Urinalysis revealed small leukocytes and 21-50 white blood cells along with numerous red blood cells. Patient was started on empiric antibiotics, vancomycin and Zosyn.  Anticipated discharge: D/C to SNF 10/17.  Assessment/Plan:    Principal problem: Sepsis secondary to urinary tract infection with hematuria / leukocytosis - Sepsis criteria met on the admission with fever, tachycardia, tachypnea and leukocytosis. UA on admission with many RBC's. Suspected source of infection is UTI. Chest x-ray did not show acute cardiopulmonary findings. - Patient started on vancomycin and Zosyn. - Urine culture so far shows no growth. We will narrow down antibiotics to Zosyn only.  Active Problems:   Dementia without behavioral disturbance  - Appreciate palliative care team recommendations on goals of care. - Per SLP - severe aspiration risk  - GOC meeting with PCT and family set up for tomorrow     Elevated troponin - Secondary to demand ischemia from sepsis - Troponin level plateaued at 0.06  - No reports of chest  pain.    Macrocytic anemia - Follow-up CBC tomorrow morning.    ARF (acute renal failure) ([place]) - Secondary to sepsis. - Mild hydronephrosis seen on renal ultrasound but patient has good urine output - Renal function improving, creatinine is 1.2097 morning.    Hypernatremia - Secondary to dehydration  - Sodium stabilized at 150 with D5 water. Will change IV fluids to normal saline and see if any improvement in sodium level.    Severe protein calorie malnutrition - In the context of chronic illness  - Seen by dietician   DVT Prophylaxis  - Lovenox subcutaneous  Code Status: Full.  Family Communication:  Family not at the bedside Disposition Plan: likely discharge by 10/17.  IV access:  Peripheral IV  Procedures and diagnostic studies:    Dg Chest 2 View 03/29/2015   No active cardiopulmonary disease. Electronically Signed   By: Kathreen Devoid   On: 03/29/2015 09:50   US Renal 03/29/2015  Mild right hydronephrosis. Electronically Signed   By: Lovey Newcomer M.D.   On: 03/29/2015 11:01   Dg Chest Port 1 View 03/28/2015  No active disease. Electronically Signed   By: Margarette Canada M.D.   On: 03/28/2015 22:12    Medical Consultants:  Palliative care team  Other Consultants:  Physical therapy Nutrition SLP  IAnti-Infectives:   Vancomycin 03/28/2015 --> Zosyn 03/28/2015 -->   Leisa Lenz, MD  [facility] Pager [phone]  Time spent in minutes: 25 minutes  If 7PM-7AM, please contact night-coverage [email or web address] Password [email or web address] 03/30/2015, 10:53 AM   LOS: 1 day    HPI/Subjective: No acute overnight events. No agitation.   Objective: Filed Vitals:   03/29/15 0533 03/29/15 1401 03/29/15 2033 03/30/15 0454  BP: 142/63 117/59 133/77 134/65  Pulse: 67 72 75 77  Temp: 98.2 F (36.8 C) 98 F (36.7 C) 98 F (36.7 C) 98 F (36.7 C)  TempSrc: Oral Oral Axillary Axillary  Resp: '20 18 18 18  ' Height:      Weight:      SpO2: 99% 98% 98% 100%    Intake/Output  Summary (Last 24 hours) at 03/30/15 1053 Last data filed at 03/30/15 0800  Gross per 24 hour  Intake   2700 ml  Output   1075 ml  Net   1625 ml    Exam:   General:  No acute distress  Cardiovascular: Rate controlled, (+) S1, S2   Respiratory: No wheezing, no crackles, no rhonchi  Abdomen: (+) BS, non tender abdomen   Extremities: No leg edema, palpable pulses   Neuro: No focal deficits   Data Reviewed: Basic Metabolic Panel:  Recent Labs Lab 03/29/15 0258 03/29/15 0545 03/29/15 0913 03/29/15 1244 03/29/15 1719 03/29/15 2152  NA 150* 148* 150* 150* 150* 150*  K 3.6 3.4* 3.4* 3.4* 3.5 3.3*  CL 118* 116* 118* 118* 116* 116*  CO2 '26 27 27 27 27 28  ' GLUCOSE 142* 164* 140* 144* 113* 120*  BUN 55* 51* 49* 44* 36* 33*  CREATININE 2.02* 1.83* 1.80* 1.58* 1.32* 1.29*  CALCIUM 8.3* 8.2* 8.4* 8.4* 8.5* 8.3*  MG 2.4 2.3  --   --   --   --   PHOS 3.2 2.7  --   --   --   --    Liver Function Tests:  Recent Labs Lab 03/28/15 2023 03/29/15 0545  AST 47* 35  ALT 12* 10*  ALKPHOS 53 41  BILITOT 0.7 0.7  PROT 6.5 5.5*  ALBUMIN 3.1* 2.5*   No results for input(s): LIPASE, AMYLASE in the last 168 hours. No results for input(s): AMMONIA in the last 168 hours. CBC:  Recent Labs Lab 03/25/15 0435 03/26/15 0552 03/28/15 2023 03/29/15 0258 03/29/15 0545  WBC 10.7* 13.1* 15.9* 12.1* 9.8  NEUTROABS  --   --  13.6*  --   --   HGB 10.3* 10.2* 9.5* 8.3* 8.1*  HCT 31.4* 31.0* 28.9* 25.6* 25.2*  MCV 100.6* 101.6* 102.1* 102.0* 102.4*  PLT 202 229 278 222 217   Cardiac Enzymes:  Recent Labs Lab 03/28/15 2023 03/29/15 0258 03/29/15 0545 03/29/15 1244  TROPONINI 0.05* 0.06* 0.06* 0.05*   BNP: Invalid input(s): POCBNP CBG: No results for input(s): GLUCAP in the last 168 hours.  Surgical pcr screen     Status: None   Collection Time: 03/22/15  7:54 AM  Result Value Ref Range Status   MRSA, PCR NEGATIVE NEGATIVE Final   Staphylococcus aureus NEGATIVE NEGATIVE  Final  Urine culture     Status: None   Collection Time: 03/28/15 10:13 PM  Result Value Ref Range Status   Specimen Description URINE, CATHETERIZED  Final   Special Requests NONE  Final   Culture   Final    NO GROWTH 1 DAY Performed at [facility]    Report Status 03/30/2015 FINAL  Final  MRSA PCR Screening     Status: None   Collection Time: 03/29/15  2:59 AM  Result Value Ref Range Status   MRSA by PCR NEGATIVE NEGATIVE Final     Scheduled Meds: . enoxaparin (LOVENOX) injection  30 mg Subcutaneous Q24H  . feeding supplement (ENSURE ENLIVE)  237 mL Oral BID BM  . folic acid  1 mg  Intravenous Daily  . pantoprazole (PROTONIX)   40 mg Intravenous QHS  . piperacillin-tazobactam (ZOSYN)  IV  3.375 g Intravenous 3 times per day  . potassium chloride  40 mEq Oral Once  . thiamine  100 mg Intravenous Daily  . vancomycin  500 mg Intravenous Q24H   Continuous Infusions: . sodium chloride

## 2015-03-30 NOTE — Progress Notes (Signed)
Speech Language Pathology Treatment: Dysphagia  Patient Details Name: Wayne Schroeder MRN: [id number] DOB: 05/11/1925 Today's Date: Kellie Simmering10/14/2016 Time: 1210-1227 SLP Time Calculation (min) (ACUTE ONLY): 17 min  Assessment / Plan / Recommendation Clinical Impression  Pt today is more alert and calm than during SLP prior visit during prior hospitalization.  He did accept SMALL amounts of icecream, nectar thick juice and water.  Pt able to hold his own cup which helps with automaticity of motor planning for swallowing.  Delayed oral transiting noted across all consistencies but no indication of overt aspiration.  Throat clearing after swallow via straw noted that may be consistent with laryngeal penetration.  Pt opens mouth minimally to accept icecream via tsp (few inches only) - suspect dementia related.  He did not have oral residuals however after minimal intake. Did not test solids due to level of oral deficits.     Intake for this pt will be laborious and likely he will not meet nutritional support, however pt CXR upon admit is negative and pt had RLL pna during last admission.   Recommend to initiate full liquid diet for swallow efficiency with full supervision, encouraging pt to self feed as able.  SLP to follow up Monday for family education and readiness for dietary advancement.  RN informed, signs posted and orders placed.   Pt making slow progression.     HPI Other Pertinent Information: 81 year old male with h/o dementia, admitted from SNF with poor by mouth intake, UTI.  Recent admission for closed right hip fracture post right hemearthroplasty on 10/7, d/c on 10/10. Hopsital stay complicated by PNA. Current CXR without acute disease. Previous bedside swallowing evaluation 10/8 recommended dysphagia 1 with nectar thick liquid to decrease aspiration risk with previously altered mentation increasing aspiration risk.    Yesterday pt seen for evaluation but was not ready for po intake.      Pertinent Vitals Pain Assessment: Faces Faces Pain Scale: No hurt  SLP Plan  Continue with current plan of care    Recommendations Diet recommendations: Thin liquid (full liquids) Liquids provided via: Cup;No straw;Teaspoon Medication Administration: Crushed with puree Compensations: Slow rate;Check for pocketing;Follow solids with liquid Postural Changes and/or Swallow Maneuvers: Seated upright 90 degrees;Upright 30-60 min after meal              Oral Care Recommendations: Oral care QID Follow up Recommendations: Skilled Nursing facility Plan: Continue with current plan of care    GO    Donavan Burnetamara Jasraj Lappe, MS [facility] SLP [phone]

## 2015-03-30 NOTE — Progress Notes (Signed)
I spoke briefly with Mr. Wayne Schroeder today.  He remains confused, and he cannot provide meaningful history.  He does not appear to be in any distress.  I called and spoke via phone with his wife, Wayne Schroeder.  She would like for her daughter to be part of any conversations regarding long term goals of care moving forward.  They are not available to meet during the day today.  We have set up a family meeting for tomorrow (03/31/15) at 1:00pm.  Please let us know if our team can be of further assistance prior to family meeting.  Romie MinusGene Mubashir Mallek, MD [facility] Palliative Medicine Team [phone]

## 2015-03-31 DIAGNOSIS — Z515 Encounter for palliative care: Secondary | ICD-10-CM

## 2015-03-31 DIAGNOSIS — Z7189 Other specified counseling: Secondary | ICD-10-CM

## 2015-03-31 DIAGNOSIS — E876 Hypokalemia: Secondary | ICD-10-CM

## 2015-03-31 DIAGNOSIS — E43 Unspecified severe protein-calorie malnutrition: Secondary | ICD-10-CM

## 2015-03-31 DIAGNOSIS — N390 Urinary tract infection, site not specified: Secondary | ICD-10-CM | POA: Insufficient documentation

## 2015-03-31 DIAGNOSIS — N179 Acute kidney failure, unspecified: Secondary | ICD-10-CM | POA: Insufficient documentation

## 2015-03-31 LAB — BASIC METABOLIC PANEL
Anion gap: 6 (ref 5–15)
BUN: 23 mg/dL — AB (ref 6–20)
CO2: 29 mmol/L (ref 22–32)
CREATININE: 1.45 mg/dL — AB (ref 0.61–1.24)
Calcium: 8.4 mg/dL — ABNORMAL LOW (ref 8.9–10.3)
Chloride: 116 mmol/L — ABNORMAL HIGH (ref 101–111)
GFR, EST AFRICAN AMERICAN: 48 mL/min — AB (ref >60)
GFR, EST NON AFRICAN AMERICAN: 41 mL/min — AB (ref >60)
GLUCOSE: 118 mg/dL — AB (ref 65–99)
Potassium: 3.4 mmol/L — ABNORMAL LOW (ref 3.5–5.1)
Sodium: 151 mmol/L — ABNORMAL HIGH (ref 135–145)

## 2015-03-31 LAB — CBC
HEMATOCRIT: 26.9 % — AB (ref 39.0–52.0)
HEMOGLOBIN: 8.5 g/dL — AB (ref 13.0–17.0)
MCH: 32.8 pg (ref 26.0–34.0)
MCHC: 31.6 g/dL (ref 30.0–36.0)
MCV: 103.9 fL — AB (ref 78.0–100.0)
Platelets: 257 10*3/uL (ref 150–400)
RBC: 2.59 MIL/uL — AB (ref 4.22–5.81)
RDW: 12.7 % (ref 11.5–15.5)
WBC: 10.1 10*3/uL (ref 4.0–10.5)

## 2015-03-31 MED ORDER — POTASSIUM CHLORIDE 10 MEQ/100ML IV SOLN
10.0000 meq | INTRAVENOUS | Status: AC
  Administered 2015-03-31 (×3): 10 meq via INTRAVENOUS
  Filled 2015-03-31: qty 100

## 2015-03-31 NOTE — Plan of Care (Signed)
Problem: Phase I Progression Outcomes Goal: Tolerating diet Outcome: Not Progressing Refusing eat, will continue to try feeding. MD aware.

## 2015-03-31 NOTE — Consult Note (Addendum)
Consultation Note Date: 03/31/2015   Patient Name: Wayne Schroeder  DOB: [birth year]  MRN: [id number]  Age / Sex: 81 y.o., male   PCP: Marton Redwood, MD Referring Physician: Robbie Lis, MD  Reason for Consultation: Establishing goals of care  Palliative Care Assessment and Plan Summary of Established Goals of Care and Medical Treatment Preferences   Clinical Assessment/Narrative: 81 year old male from skilled nursing facility with past medical history significant for dementia, recent hospitalization for right hip fracture status post right hemiarthroplasty in hospital course complicated with development of pneumonia. He presented from skilled nursing facility where he was admitted for rehab for his hip with poor by mouth intake. His blood work demonstrated increased white blood cell count and rising creatinine.  He is currently admitted with sepsis secondary to UTI. Palliative was consulted for goals of care discussion.  With the patient's daughter and wife and they report that is been very difficult due to the fact that Mr. Armijo has continued to decline over the last 5 or 6 years from advanced dementia. He had been living with his wife at home prior to having a hip fracture one or 2 weeks ago. He has been having increased wandering and other behaviors that have become more difficult to manage.  Following a fall and hip fracture, he was placed to [facility] for rehabilitation. His family reports they are very happy with the care he was receiving there but he was only there for 2 day period. His wife reports that she does not think he would ever be well enough to transition back to home and that they were thinking about placement in a memory care unit allowing his rehabilitation stay.  Both Ms. Kos and her daughter are very reasonable about Mr. Ruelas care. They want to continue to focus on interventions that are likely to help him at the quality of his life what the same time  limiting interventions that are not likely in line with this goal.  We discussed that the hospital can be useful as long as he is getting well enough from care he receives at the hospital to enjoy his time outside the hospital, but there is going to come a time in the near future where, if his goal is to be out of the hospital, he may be better served to plan bringing care to him rather repeated trips to the hospital. We discussed hospice as a tool that may be beneficial in this goal when he reaches a point where we are trying to fix problems that are not fixable.  She is in agreement that a good plan would be to plan to go to [facility] as they have previously been arranging. He has done well with rehabbing in the past. If he does well and continues to thrive, they continue with this plan. If, however he is unable to regain function and he continues to decline, I recommended that she determine if he may be better served by focusing his care on staying at [facility] or another facility with support of organization such as hospice.  - Family would like to pursue trial of rehab at [facility] following discharge if he is able. - We talked at length about his nutritional status and how this would be a large determining factor in his clinical course moving forward. They have good understanding of this and realized that if he is unable to maintain his own nutrition that his time would be very limited (in the matter  of weeks). If it remains the case that he is not taking in any significant nutrition by mouth, they would be open to consideration for enrollment in a residential hospice facility for end-of-life care. We discussed benefits of careful hand feeding over feeding tube placement in advanced dementia, and they are not interested in consideration of a feeding tube in the future. -Completed a durable DO NOT RESUSCITATE as well as a MOST form today. If he remains unable to make functional gains or  increase his nutrition his wife and daughter are very much on the page of pursuing comfort measures. They do, however, want to ensure that he has opportunity for trial of rehabilitation to see if he can improve.  Contacts/Participants in Discussion: Primary Decision Maker: Patient's wife, June  HCPOA: None on chart, but reported to be his wife June   Code Status/Advance Care Planning: DNR We discussed that in light of continued worsening of his chronic medical problems further complicated by his recent hip fracture, we should be focused on interventions that are likely to allow Mr Spreen achieve the stated goals of being out of the hospital and spending time with family. I discussed with family regarding heroic interventions at the end-of-life and they agree this would not be in line with prior expressed wishes for a natural death or be likely to lead to getting well enough to go ever leave the hospital. They were in agreement with changing CODE STATUS to DO NOT RESUSCITATE. Attending physician was notified via text page.  Additional Recommendations (Limitations, Scope, Preferences): I met with the patients wife and daughter. We reviewed a MOST form and discussed how to develop plan of care to focus on continuing therapies that would maximize chance of spending quality time outside the hospital and limiting therapies not in line with this goal. I discussed with his family regarding heroic interventions at the end-of-life. There is agreement this would not be in line with prior expressed wishes for a natural death or be likely to lead to getting well enough to ever leave the hospital. Family in agreement with changing CODE STATUS to DO NOT RESUSCITATE. We completed MOST form today. DNR, Limited additional interventions, IVF and ABX if indicated, no feeding tube.  Psycho-social/Spiritual:   Support System:Strong through family  Desire for further Chaplaincy support:no  Prognosis: < 6  months  Discharge Planning:  [place] for rehab with Palliative care service follow-up       Chief Complaint/History of Present Illness:  81 year old male from skilled nursing facility with past medical history significant for dementia, recent hospitalization for right hip fracture status post right hemiarthroplasty in hospital course complicated with development of pneumonia. He presented from skilled nursing facility where he was admitted for rehab for his hip with poor by mouth intake. His blood work demonstrated increased white blood cell count and rising creatinine.  He is currently admitted with sepsis secondary to UTI.   Primary Diagnoses  Present on Admission:  . Macrocytic anemia . Leukocytosis . Dementia . ARF (acute renal failure) ([place]) . (Resolved) Hypernatremia . Elevated troponin . UTI (urinary tract infection) . Sepsis ([place]) . Acute hypernatremia . Protein-calorie malnutrition, severe . Hypokalemia  Palliative Review of Systems: Unable to participate I have reviewed the medical record, interviewed the patient and family, and examined the patient. The following aspects are pertinent.  Past Medical History  Diagnosis Date  . Dementia    Social History   Social History  . Marital Status: Married  Spouse Name: N/A  . Number of Children: N/A  . Years of Education: N/A   Social History Main Topics  . Smoking status: Never Smoker   . Smokeless tobacco: None  . Alcohol Use: No  . Drug Use: No  . Sexual Activity: Not Asked   Other Topics Concern  . None   Social History Narrative   Family History  Problem Relation Age of Onset  . Dementia Other    Scheduled Meds: . antiseptic oral rinse  7 mL Mouth Rinse q12n4p  . chlorhexidine  15 mL Mouth Rinse BID  . enoxaparin (LOVENOX) injection  30 mg Subcutaneous Q24H  . feeding supplement (ENSURE ENLIVE)  237 mL Oral BID BM  . folic acid  1 mg Intravenous Daily  . pantoprazole (PROTONIX)  IV  40 mg Intravenous QHS  . piperacillin-tazobactam (ZOSYN)  IV  3.375 g Intravenous 3 times per day  . sodium chloride  3 mL Intravenous Q12H  . thiamine  100 mg Intravenous Daily   Continuous Infusions: . sodium chloride 75 mL/hr at 03/31/15 1745   PRN Meds:.acetaminophen **OR** acetaminophen, HYDROcodone-acetaminophen, ondansetron **OR** ondansetron (ZOFRAN) IV Medications Prior to Admission:  Prior to Admission medications   Medication Sig Start Date End Date Taking? Authorizing Provider  acetaminophen (TYLENOL) 325 MG tablet Take 650 mg by mouth every 4 (four) hours as needed for mild pain.   Yes Historical Provider, MD  amoxicillin-clavulanate (AUGMENTIN) 875-125 MG tablet Take 1 tablet by mouth 2 (two) times daily. 03/26/15  Yes Verlee Monte, MD  enoxaparin (LOVENOX) 40 MG/0.4ML injection Inject 0.4 mLs (40 mg total) into the skin daily. 03/26/15  Yes Verlee Monte, MD  omeprazole (PRILOSEC) 40 MG capsule Take 40 mg by mouth 2 (two) times daily.   Yes Historical Provider, MD  Protein (PROCEL 100) POWD Take 2 scoop by mouth 2 (two) times daily.   Yes Historical Provider, MD  HYDROcodone-acetaminophen (NORCO/VICODIN) 5-325 MG tablet Take 1-2 tablets by mouth every 6 (six) hours as needed for moderate pain. 03/26/15   Verlee Monte, MD  omeprazole (PRILOSEC) 20 MG capsule Take 1 capsule (20 mg total) by mouth 2 (two) times daily. Patient not taking: Reported on 03/28/2015 11/07/14   Tanna Furry, MD   No Known Allergies CBC:    Component Value Date/Time   WBC 10.1 03/31/2015 0506   HGB 8.5* 03/31/2015 0506   HCT 26.9* 03/31/2015 0506   PLT 257 03/31/2015 0506   MCV 103.9* 03/31/2015 0506   NEUTROABS 13.6* 03/28/2015 2023   LYMPHSABS 0.8 03/28/2015 2023   MONOABS 1.5* 03/28/2015 2023   EOSABS 0.1 03/28/2015 2023   BASOSABS 0.0 03/28/2015 2023   Comprehensive Metabolic Panel:    Component Value Date/Time   NA 151* 03/31/2015 0506   K 3.4* 03/31/2015 0506   CL 116* 03/31/2015  0506   CO2 29 03/31/2015 0506   BUN 23* 03/31/2015 0506   CREATININE 1.45* 03/31/2015 0506   GLUCOSE 118* 03/31/2015 0506   CALCIUM 8.4* 03/31/2015 0506   AST 35 03/29/2015 0545   ALT 10* 03/29/2015 0545   ALKPHOS 41 03/29/2015 0545   BILITOT 0.7 03/29/2015 0545   PROT 5.5* 03/29/2015 0545   ALBUMIN 2.5* 03/29/2015 0545    Physical Exam: Vital Signs: BP 132/61 mmHg  Pulse 71  Temp(Src) 98 F (36.7 C) (Oral)  Resp 18  Ht _0  (1.727 m)  Wt 54.432 kg (120 lb)  BMI 18.25 kg/m2  SpO2 96% SpO2: SpO2: 96 % O2 Device: O2  Device: Not Delivered O2 Flow Rate:   Intake/output summary:  Intake/Output Summary (Last 24 hours) at 03/31/15 1945 Last data filed at 03/31/15 1840  Gross per 24 hour  Intake   1255 ml  Output   1350 ml  Net    -95 ml   LBM: Last BM Date:  (unknown) Baseline Weight: Weight: 54.432 kg (120 lb) Most recent weight: Weight: 54.432 kg (120 lb)  Exam Findings:  General: Sleepy, in no acute distress, lying in bed in largely nonverbal.  HEENT: No bruits, no goiter, no JVD Heart: Regular rate and rhythm.  Lungs: Fair air movement, clear Abdomen: Soft, nontender, nondistended, positive bowel sounds.  Ext: No significant edema Skin: Warm and dry Neuro: Cannot formally assess. Does not follow commands. Moves 4 extremities.          Palliative Performance Scale:30               Additional Data Reviewed: Recent Labs     03/29/15  0545   03/29/15  2152  03/31/15  0506  WBC  9.8   --    --   10.1  HGB  8.1*   --    --   8.5*  PLT  217   --    --   257  NA  148*   < >  150*  151*  BUN  51*   < >  33*  23*  CREATININE  1.83*   < >  1.29*  1.45*   < > = values in this interval not displayed.     Time In: 1300 Time Out: 1420 Time Total: 80 Greater than 50%  of this time was spent counseling and coordinating care related to the above assessment and plan.  Signed by: Micheline Rough, MD  Micheline Rough, MD  03/31/2015, 7:45 PM  Please contact  Palliative Medicine Team phone at [phone] for questions and concerns.

## 2015-03-31 NOTE — Progress Notes (Signed)
Patient ID: Wayne Schroeder, male   DOB: 17-Apr-1925, 81 y.o.   MRN: [id number] [facility] PROGRESS NOTE  SATOSHI KALAS DVV:[id number] DOB: 12/10/1924 DOA: 03/28/2015 PCP: Marton Redwood, MD  Brief narrative:    81 year old male from skilled nursing facility with past medical history significant for dementia, recent hospitalization for right hip fracture status post right hemiarthroplasty in hospital course complicated with development of pneumonia. He presented from skilled nursing facility with poor by mouth intake. His blood work demonstrated increased white blood cell count and rising creatinine.  On admission, patient was stable. His blood work was significant for white blood cell count of 15.9, hemoglobin 9.5, sodium of 150, creatinine 2.02 and elevated troponin at 0.06. Chest x-ray did not demonstrate active disease. Renal ultrasound demonstrated mild right hydronephrosis. Urinalysis revealed small leukocytes and 21-50 white blood cells along with numerous red blood cells. Patient was started on empiric antibiotics, vancomycin and Zosyn.  Anticipated discharge: D/C to SNF 10/17.  Assessment/Plan:    Principal problem: Sepsis secondary to urinary tract infection with hematuria / leukocytosis - Sepsis criteria met on the admission with fever, tachycardia, tachypnea and leukocytosis. UA on admission with many RBC's. UTI is suspected source of infection. - Initially on vanco and zosyn, stopped vanco today - Blood and urine cultures so far show no growth.   Active Problems:   Dementia without behavioral disturbance  - GOC meeting with PCT and family set up for today, will follow up    Elevated troponin - Secondary to demand ischemia from sepsis - No chest pain  - Troponin level plateaued at 0.06     Macrocytic anemia - Hgb stable at 8.5 - No current indications for transfusion     ARF (acute renal failure) ([place]) - Secondary to sepsis and dehydration  - Mild hydronephrosis  seen on renal ultrasound but patient has good urine output - Creatinine improving from 2.02 --> 1.4    Hypernatremia - Secondary to dehydration  - Sodium stabilized at 150 -151 - Continue IV fluids for hydration    Hypokalemia - Due to poor nutritional status - Supplemented this am      Severe protein calorie malnutrition - In the context of chronic illness  - NPO due to risk of aspiration  - Continue IV fluids for hydration     DVT Prophylaxis  - Lovenox subcutaneous in hospital   Code Status: Full.  Family Communication:  Family not at the bedside Disposition Plan: D/C by 10/17 to SNF.  IV access:  Peripheral IV  Procedures and diagnostic studies:    Dg Chest 2 View 03/29/2015   No active cardiopulmonary disease. Electronically Signed   By: Kathreen Devoid   On: 03/29/2015 09:50   US Renal 03/29/2015  Mild right hydronephrosis. Electronically Signed   By: Lovey Newcomer M.D.   On: 03/29/2015 11:01   Dg Chest Port 1 View 03/28/2015  No active disease. Electronically Signed   By: Margarette Canada M.D.   On: 03/28/2015 22:12    Medical Consultants:  Palliative care team  Other Consultants:  Physical therapy Nutrition SLP  IAnti-Infectives:   Vancomycin 03/28/2015 --> 03/31/2015 Zosyn 03/28/2015 -->   Luz Burcher, MD  [facility] Pager [phone]  Time spent in minutes: 15 minutes  If 7PM-7AM, please contact night-coverage [email or web address] Password [email or web address] 03/31/2015, 11:15 AM   LOS: 2 days    HPI/Subjective: No acute overnight events. No respiratory distress.   Objective: Filed Vitals:   03/30/15 0454 03/30/15 1300  03/30/15 2238 03/31/15 0412  BP: 134/65 132/73 151/79 137/70  Pulse: 77 81 97 80  Temp: 98 F (36.7 C) 98.2 F (36.8 C) 98.4 F (36.9 C) 98.4 F (36.9 C)  TempSrc: Axillary Oral Oral Axillary  Resp: '18 20  20  ' Height:      Weight:      SpO2: 100% 97% 90% 98%    Intake/Output Summary (Last 24 hours) at 03/31/15 1115 Last data filed at  03/31/15 0538  Gross per 24 hour  Intake    510 ml  Output   1150 ml  Net   -640 ml    Exam:   General:  Pt sleeping, no distress  Cardiovascular: RRR, (+) S1, S2   Respiratory: bilateral air entry, no wheezing   Abdomen: non tender, non distended, (+) BS  Extremities: No swelling, palpable pulses   Neuro: Non focal.   Data Reviewed: Basic Metabolic Panel:  Recent Labs Lab 03/29/15 0258 03/29/15 0545 03/29/15 0913 03/29/15 1244 03/29/15 1719 03/29/15 2152 03/31/15 0506  NA 150* 148* 150* 150* 150* 150* 151*  K 3.6 3.4* 3.4* 3.4* 3.5 3.3* 3.4*  CL 118* 116* 118* 118* 116* 116* 116*  CO2 '26 27 27 27 27 28 29  ' GLUCOSE 142* 164* 140* 144* 113* 120* 118*  BUN 55* 51* 49* 44* 36* 33* 23*  CREATININE 2.02* 1.83* 1.80* 1.58* 1.32* 1.29* 1.45*  CALCIUM 8.3* 8.2* 8.4* 8.4* 8.5* 8.3* 8.4*  MG 2.4 2.3  --   --   --   --   --   PHOS 3.2 2.7  --   --   --   --   --    Liver Function Tests:  Recent Labs Lab 03/28/15 2023 03/29/15 0545  AST 47* 35  ALT 12* 10*  ALKPHOS 53 41  BILITOT 0.7 0.7  PROT 6.5 5.5*  ALBUMIN 3.1* 2.5*   No results for input(s): LIPASE, AMYLASE in the last 168 hours. No results for input(s): AMMONIA in the last 168 hours. CBC:  Recent Labs Lab 03/26/15 0552 03/28/15 2023 03/29/15 0258 03/29/15 0545 03/31/15 0506  WBC 13.1* 15.9* 12.1* 9.8 10.1  NEUTROABS  --  13.6*  --   --   --   HGB 10.2* 9.5* 8.3* 8.1* 8.5*  HCT 31.0* 28.9* 25.6* 25.2* 26.9*  MCV 101.6* 102.1* 102.0* 102.4* 103.9*  PLT 229 278 222 217 257   Cardiac Enzymes:  Recent Labs Lab 03/28/15 2023 03/29/15 0258 03/29/15 0545 03/29/15 1244  TROPONINI 0.05* 0.06* 0.06* 0.05*   BNP: Invalid input(s): POCBNP CBG: No results for input(s): GLUCAP in the last 168 hours.  Results for orders placed or performed during the hospital encounter of 03/28/15  Urine culture     Status: None   Collection Time: 03/28/15 10:13 PM  Result Value Ref Range Status   Specimen  Description URINE, CATHETERIZED  Final   Special Requests NONE  Final   Culture   Final    NO GROWTH 1 DAY Performed at [facility]    Report Status 03/30/2015 FINAL  Final  Culture, blood (routine x 2)     Status: None (Preliminary result)   Collection Time: 03/29/15 12:12 AM  Result Value Ref Range Status   Specimen Description BLOOD RIGHT ANTECUBITAL  Final   Special Requests BOTTLES DRAWN AEROBIC AND ANAEROBIC 10ML  Final   Culture   Final    NO GROWTH 1 DAY Performed at [facility]    Report Status  PENDING  Incomplete  Culture, blood (routine x 2)     Status: None (Preliminary result)   Collection Time: 03/29/15 12:12 AM  Result Value Ref Range Status   Specimen Description BLOOD RIGHT ARM  Final   Special Requests BOTTLES DRAWN AEROBIC AND ANAEROBIC 7ML  Final   Culture   Final    NO GROWTH 1 DAY Performed at [facility]    Report Status PENDING  Incomplete  MRSA PCR Screening     Status: None   Collection Time: 03/29/15  2:59 AM  Result Value Ref Range Status   MRSA by PCR NEGATIVE NEGATIVE Final    Comment:        The GeneXpert MRSA Assay (FDA approved for NASAL specimens only), is one component of a comprehensive MRSA colonization surveillance program. It is not intended to diagnose MRSA infection nor to guide or monitor treatment for MRSA infections.      Marland Kitchen antiseptic oral rinse  7 mL Mouth Rinse q12n4p  . chlorhexidine  15 mL Mouth Rinse BID  . enoxaparin (LOVENOX) injection  30 mg Subcutaneous Q24H  . feeding supplement (ENSURE ENLIVE)  237 mL Oral BID BM  . folic acid  1 mg Intravenous Daily  . pantoprazole (PROTONIX) IV  40 mg Intravenous QHS  . piperacillin-tazobactam (ZOSYN)  IV  3.375 g Intravenous 3 times per day  . sodium chloride  3 mL Intravenous Q12H  . thiamine  100 mg Intravenous Daily     Continuous Infusions: . sodium chloride Stopped (03/30/15 1640)  . sodium chloride 75 mL/hr at 03/30/15 1640

## 2015-04-01 DIAGNOSIS — Z515 Encounter for palliative care: Secondary | ICD-10-CM | POA: Insufficient documentation

## 2015-04-01 LAB — BASIC METABOLIC PANEL
ANION GAP: 7 (ref 5–15)
BUN: 20 mg/dL (ref 6–20)
CHLORIDE: 119 mmol/L — AB (ref 101–111)
CO2: 28 mmol/L (ref 22–32)
Calcium: 8.8 mg/dL — ABNORMAL LOW (ref 8.9–10.3)
Creatinine, Ser: 1.2 mg/dL (ref 0.61–1.24)
GFR calc Af Amer: >60 mL/min (ref >60)
GFR, EST NON AFRICAN AMERICAN: 52 mL/min — AB (ref >60)
GLUCOSE: 81 mg/dL (ref 65–99)
POTASSIUM: 3.7 mmol/L (ref 3.5–5.1)
Sodium: 154 mmol/L — ABNORMAL HIGH (ref 135–145)

## 2015-04-01 LAB — CBC
HEMATOCRIT: 30 % — AB (ref 39.0–52.0)
Hemoglobin: 9.2 g/dL — ABNORMAL LOW (ref 13.0–17.0)
MCH: 32.2 pg (ref 26.0–34.0)
MCHC: 30.7 g/dL (ref 30.0–36.0)
MCV: 104.9 fL — AB (ref 78.0–100.0)
Platelets: 284 10*3/uL (ref 150–400)
RBC: 2.86 MIL/uL — AB (ref 4.22–5.81)
RDW: 12.8 % (ref 11.5–15.5)
WBC: 9.1 10*3/uL (ref 4.0–10.5)

## 2015-04-01 MED ORDER — DEXTROSE 5 % IV SOLN
INTRAVENOUS | Status: DC
  Administered 2015-04-01 – 2015-04-02 (×2): via INTRAVENOUS

## 2015-04-01 MED ORDER — ONDANSETRON HCL 4 MG PO TABS: 4.0000 mg | ORAL_TABLET | Freq: Four times a day (QID) | ORAL | Status: AC | PRN

## 2015-04-01 MED ORDER — DEXTROSE 5 % IV SOLN: INTRAVENOUS | Status: DC

## 2015-04-01 MED ORDER — ENOXAPARIN SODIUM 30 MG/0.3ML ~~LOC~~ SOLN: 30.0000 mg | SUBCUTANEOUS | Status: DC

## 2015-04-01 MED ORDER — CHLORHEXIDINE GLUCONATE 0.12 % MT SOLN: 15.0000 mL | Freq: Two times a day (BID) | OROMUCOSAL | Status: DC

## 2015-04-01 MED ORDER — ENSURE ENLIVE PO LIQD: 237.0000 mL | Freq: Two times a day (BID) | ORAL | Status: DC

## 2015-04-01 MED ORDER — CETYLPYRIDINIUM CHLORIDE 0.05 % MT LIQD: 7.0000 mL | Freq: Two times a day (BID) | OROMUCOSAL | Status: DC

## 2015-04-01 MED ORDER — PANTOPRAZOLE SODIUM 40 MG PO TBEC: 40.0000 mg | DELAYED_RELEASE_TABLET | Freq: Every day | ORAL | Status: AC

## 2015-04-01 NOTE — Discharge Summary (Signed)
Physician Discharge Summary  Wayne Schroeder CHE:[id number] DOB: 12/14/1924 DOA: 03/28/2015  PCP: Marton Redwood, MD  Admit date: 03/28/2015 Discharge date: 04/02/2015  Recommendations for Outpatient Follow-up:  1. Please continue to have palliative care service follow in SNF and address goals of care   Discharge Diagnoses:  Principal Problem:   Sepsis ([place]) Active Problems:   UTI (urinary tract infection)   Dementia   Macrocytic anemia   Leukocytosis   ARF (acute renal failure) (HCC)   Elevated troponin   Acute hypernatremia   Protein-calorie malnutrition, severe   Hypokalemia   Acute renal failure (ARF) (HCC)   Urinary tract infectious disease    Discharge Condition: stable   Diet recommendation: as tolerated   History of present illness:  81 year old male from skilled nursing facility with past medical history significant for dementia, recent hospitalization for right hip fracture status post right hemiarthroplasty in hospital course complicated with development of pneumonia. He presented from skilled nursing facility with poor by mouth intake. His blood work demonstrated increased white blood cell count and rising creatinine.  On admission, patient was stable. His blood work was significant for white blood cell count of 15.9, hemoglobin 9.5, sodium of 150, creatinine 2.02 and elevated troponin at 0.06. Chest x-ray did not demonstrate active disease. Renal ultrasound demonstrated mild right hydronephrosis. Urinalysis revealed small leukocytes and 21-50 white blood cells along with numerous red blood cells. Patient was started on empiric antibiotics, vancomycin and Zosyn.  Hospital Course:   Assessment/Plan:    Principal problem: Sepsis secondary to urinary tract infection with hematuria / leukocytosis - Sepsis criteria met on the admission with fever, tachycardia, tachypnea and leukocytosis. UA on admission with many RBC's. UTI is suspected source of infection however  urine culture showed no growth. Blood cultures show no growth.  - Vanco stopped 10/15. We have stopped zosyn 10/16. - Abx not needed on discharge    Active Problems:  Dementia without behavioral disturbance  - Appreciate palliative care team addressing goals of care   Elevated troponin - Likely due to demand ischemia from sepsis - Troponin level stable at 0.06  - 2 D ECHO on this admission with grade 1-2 diastolic dysfunction with preserved EF   Macrocytic anemia - Hgb stable at 8.5   ARF (acute renal failure) (HCC) - Secondary to sepsis, dehydration  - Renal US showed mild hydronephrosis - Cr normalized with IV fluids    Hypernatremia - Secondary to dehydration  - Sodium up at 154 but fluids changed back to D5 water.    Hypokalemia - Due to poor nutritional status - Supplemented    Severe protein calorie malnutrition - In the context of chronic illness  - Continue feeding supplementation    DVT Prophylaxis  - Lovenox subcutaneous thorough 10/24 as per prior regimen before this admission   Code Status: DNR/DNI Family Communication: Family not at the bedside   IV access:  Peripheral IV  Procedures and diagnostic studies:   Dg Chest 2 View 03/29/2015 No active cardiopulmonary disease. Electronically Signed By: Kathreen Devoid On: 03/29/2015 09:50   US Renal 03/29/2015 Mild right hydronephrosis. Electronically Signed By: Lovey Newcomer M.D. On: 03/29/2015 11:01   Dg Chest Port 1 View 03/28/2015 No active disease. Electronically Signed By: Margarette Canada M.D. On: 03/28/2015 22:12    Medical Consultants:  Palliative care team  Other Consultants:  Physical therapy Nutrition SLP  IAnti-Infectives:   Vancomycin 03/28/2015 --> 03/31/2015 Zosyn 03/28/2015 --> 04/01/2015  Signed:  Leisa Lenz, MD  [facility] 04/01/2015, 8:26 AM  Pager #: [phone]  Time spent in minutes: more than 30  minutes   Discharge Exam: Filed Vitals:   04/01/15 0510  BP: 134/61  Pulse: 71  Temp: 98.3 F (36.8 C)  Resp: 20   Filed Vitals:   03/31/15 0412 03/31/15 1238 03/31/15 2059 04/01/15 0510  BP: 137/70 132/61 149/56 134/61  Pulse: 80 71 78 71  Temp: 98.4 F (36.9 C) 98 F (36.7 C) 99.3 F (37.4 C) 98.3 F (36.8 C)  TempSrc: Axillary Oral Axillary Axillary  Resp: '20 18 18 20  ' Height:      Weight:      SpO2: 98% 96% 98% 100%    General: Pt is alert, not in acute distress Cardiovascular: Regular rate and rhythm, S1/S2 (+) Respiratory: Clear to auscultation bilaterally, no wheezing, no crackles, no rhonchi Abdominal: Soft, non tender, non distended, bowel sounds +, no guarding Extremities: no edema, no cyanosis, pulses palpable bilaterally DP and PT Neuro: Grossly nonfocal  Discharge Instructions  Discharge Instructions    Call MD for:  difficulty breathing, headache or visual disturbances    Complete by:  As directed      Call MD for:  persistant dizziness or light-headedness    Complete by:  As directed      Call MD for:  persistant nausea and vomiting    Complete by:  As directed      Call MD for:  severe uncontrolled pain    Complete by:  As directed      Diet - low sodium heart healthy    Complete by:  As directed      Increase activity slowly    Complete by:  As directed             Medication List    STOP taking these medications        amoxicillin-clavulanate 875-125 MG tablet  Commonly known as:  AUGMENTIN     HYDROcodone-acetaminophen 5-325 MG tablet  Commonly known as:  NORCO/VICODIN     omeprazole 20 MG capsule  Commonly known as:  PRILOSEC     omeprazole 40 MG capsule  Commonly known as:  PRILOSEC      TAKE these medications        acetaminophen 325 MG tablet  Commonly known as:  TYLENOL  Take 650 mg by mouth every 4 (four) hours as needed for mild pain.     antiseptic oral rinse 0.05 % Liqd solution  Commonly known as:  CPC /  CETYLPYRIDINIUM CHLORIDE 0.05%  7 mLs by Mouth Rinse route 2 times daily at 12 noon and 4 pm.     chlorhexidine 0.12 % solution  Commonly known as:  PERIDEX  15 mLs by Mouth Rinse route 2 (two) times daily.     enoxaparin 30 MG/0.3ML injection  Commonly known as:  LOVENOX  Inject 0.3 mLs (30 mg total) into the skin daily.     feeding supplement (ENSURE ENLIVE) Liqd  Take 237 mLs by mouth 2 (two) times daily between meals.     ondansetron 4 MG tablet  Commonly known as:  ZOFRAN  Take 1 tablet (4 mg total) by mouth every 6 (six) hours as needed for nausea.     pantoprazole 40 MG tablet  Commonly known as:  PROTONIX  Take 1 tablet (40 mg total) by mouth daily.     PROCEL 100 Powd  Take 2 scoop by mouth 2 (two) times daily.  Follow-up Information    Follow up with Marton Redwood, MD. Schedule an appointment as soon as possible for a visit in 1 week.   Specialty:  Internal Medicine   Why:  Follow up appt after recent hospitalization   Contact information:   [place] [place] [phone]        The results of significant diagnostics from this hospitalization (including imaging, microbiology, ancillary and laboratory) are listed below for reference.    Significant Diagnostic Studies: Dg Chest 1 View  03/21/2015  CLINICAL DATA:  Unwitnessed fall. EXAM: CHEST 1 VIEW COMPARISON:  11/06/2014 FINDINGS: The heart size and mediastinal contours are within normal limits. Both lungs are clear. The visualized skeletal structures are unremarkable. IMPRESSION: No active disease. Electronically Signed   By: Andreas Newport M.D.   On: 03/21/2015 21:49   Dg Chest 2 View  03/29/2015  CLINICAL DATA:  Sepsis, weakness, fever EXAM: CHEST  2 VIEW COMPARISON:  03/28/2015 FINDINGS: The heart size and mediastinal contours are within normal limits. Both lungs are clear. The visualized skeletal structures are unremarkable. IMPRESSION: No active cardiopulmonary disease.  Electronically Signed   By: Kathreen Devoid   On: 03/29/2015 09:50   Dg Pelvis 1-2 Views  03/21/2015  CLINICAL DATA:  Status post fall. Concern for pelvic injury. Right thigh pain. Initial encounter. EXAM: PELVIS - 1-2 VIEW COMPARISON:  Abdominal radiographs performed 11/06/2014 and 12/23/2011 FINDINGS: There is new cortical irregularity at the right femoral neck, raising suspicion for a subcapital fracture of the right femoral neck. The right femoral head remains seated at the acetabulum. Mild sclerotic change is noted at the sacroiliac joints. The left hip joint is unremarkable in appearance, with a small osseous density lateral to the joint space likely reflecting a small loose body. The visualized bowel gas pattern is grossly unremarkable. IMPRESSION: New cortical irregularity of the right femoral neck, raising suspicion for a subcapital fracture of the right femoral neck. Electronically Signed   By: Garald Balding M.D.   On: 03/21/2015 21:49   US Pelvis Limited  03/24/2015  CLINICAL DATA:  Patient with right pubic hematoma. Swelling after discontinuation of Foley catheter. EXAM: ULTRASOUND OF THE MALE PELVIS COMPARISON:  None. FINDINGS: The area of palpable concern was scanned with ultrasound and no definite hematoma or significant edema was identified. IMPRESSION: No large hematoma or area of edema identified at the site of concern. Electronically Signed   By: Lovey Newcomer M.D.   On: 03/24/2015 18:06   US Renal  03/29/2015  CLINICAL DATA:  Patient with acute renal failure. EXAM: RENAL / URINARY TRACT ULTRASOUND COMPLETE COMPARISON:  None. FINDINGS: Right Kidney: Length: 10.7 cm. Mild hydronephrosis. Normal renal cortical thickness and echogenicity. Mild amount of perinephric fluid. Left Kidney: Length: 11.1 cm. Echogenicity within normal limits. No mass or hydronephrosis visualized. Bladder: Foley catheter decompresses the urinary bladder. IMPRESSION: Mild right hydronephrosis. Electronically Signed    By: Lovey Newcomer M.D.   On: 03/29/2015 11:01   Pelvis Portable  03/23/2015  CLINICAL DATA:  Right hip hemiarthroplasty. EXAM: PORTABLE PELVIS 1-2 VIEWS COMPARISON:  Earlier same day FINDINGS: Single view shows bipolar hemiarthroplasty of the right hip. Components appear well positioned. No radiographically detectable complication. IMPRESSION: Good appearance following bipolar hemiarthroplasty on the right. Electronically Signed   By: Nelson Chimes M.D.   On: 03/23/2015 20:56   Dg Chest Port 1 View  03/28/2015  CLINICAL DATA:  81 year old male with poor p.o. intake and increased white count. EXAM: PORTABLE  CHEST 1 VIEW COMPARISON:  03/22/2015 and prior radiographs FINDINGS: The cardiomediastinal silhouette is unremarkable. There is no evidence of focal airspace disease, pulmonary edema, suspicious pulmonary nodule/mass, pleural effusion, or pneumothorax. No acute bony abnormalities are identified. IMPRESSION: No active disease. Electronically Signed   By: Margarette Canada M.D.   On: 03/28/2015 22:12   Dg Chest Port 1 View  03/22/2015  CLINICAL DATA:  Hypoxia. EXAM: PORTABLE CHEST 1 VIEW COMPARISON:  03/21/2015 FINDINGS: Heart size is normal. There is patchy infiltrate in the right lung base consistent with infectious process. No edema. Left lung is clear. IMPRESSION: Right lower lobe infiltrate. Electronically Signed   By: Nolon Nations M.D.   On: 03/22/2015 14:04   Dg Knee Complete 4 Views Right  03/21/2015  CLINICAL DATA:  Unwitnessed fall at home now with right knee and thigh pain. EXAM: RIGHT KNEE - COMPLETE 4+ VIEW COMPARISON:  Right femur radiographs - earlier same day FINDINGS: The lateral radiograph is degraded due to obliquity. Osteopenia without definite displaced fracture. Mild degenerative change of the knee, most conspicuous within the medial compartment with joint space loss, subchondral sclerosis and osteophytosis. No evidence of chondrocalcinosis. No joint effusion or evidence of  lipohemarthrosis. Adjacent vascular calcifications. No radiopaque foreign body. IMPRESSION: Osteopenia without fracture or dislocation. Electronically Signed   By: Sandi Mariscal M.D.   On: 03/21/2015 21:49   Dg C-arm 1-60 Min-no Report  03/23/2015  CLINICAL DATA: surgery C-ARM 1-60 MINUTES Fluoroscopy was utilized by the requesting physician.  No radiographic interpretation.   Dg Hip Operative Unilat With Pelvis Right  03/23/2015  CLINICAL DATA:  Right anterior hemi arthroplasty. EXAM: OPERATIVE right HIP (WITH PELVIS IF PERFORMED) 2 VIEWS TECHNIQUE: Fluoroscopic spot image(s) were submitted for interpretation post-operatively. COMPARISON:  None. FINDINGS: Two-view show bipolar hemiarthroplasty on the right. Components appear grossly well positioned. No identifiable complication. IMPRESSION: Right bipolar hemiarthroplasty. Electronically Signed   By: Nelson Chimes M.D.   On: 03/23/2015 20:17   Dg Femur, Min 2 Views Right  03/21/2015  CLINICAL DATA:  Fall EXAM: RIGHT FEMUR 2 VIEWS COMPARISON:  None. FINDINGS: Subcapital femoral neck fracture with impaction is present. Osteopenia. Vascular calcifications are noted. No dislocation. IMPRESSION: Acute subcapital right femoral neck fracture with impaction. Osteopenia. Electronically Signed   By: Marybelle Killings M.D.   On: 03/21/2015 21:49    Microbiology: Recent Results (from the past 240 hour(s))  Urine culture     Status: None   Collection Time: 03/28/15 10:13 PM  Result Value Ref Range Status   Specimen Description URINE, CATHETERIZED  Final   Special Requests NONE  Final   Culture   Final    NO GROWTH 1 DAY Performed at [facility]    Report Status 03/30/2015 FINAL  Final  Culture, blood (routine x 2)     Status: None (Preliminary result)   Collection Time: 03/29/15 12:12 AM  Result Value Ref Range Status   Specimen Description BLOOD RIGHT ANTECUBITAL  Final   Special Requests BOTTLES DRAWN AEROBIC AND ANAEROBIC 10ML  Final   Culture    Final    NO GROWTH 2 DAYS Performed at [facility]    Report Status PENDING  Incomplete  Culture, blood (routine x 2)     Status: None (Preliminary result)   Collection Time: 03/29/15 12:12 AM  Result Value Ref Range Status   Specimen Description BLOOD RIGHT ARM  Final   Special Requests BOTTLES DRAWN AEROBIC AND ANAEROBIC 7ML  Final  Culture   Final    NO GROWTH 2 DAYS Performed at [facility]    Report Status PENDING  Incomplete  MRSA PCR Screening     Status: None   Collection Time: 03/29/15  2:59 AM  Result Value Ref Range Status   MRSA by PCR NEGATIVE NEGATIVE Final    Comment:        The GeneXpert MRSA Assay (FDA approved for NASAL specimens only), is one component of a comprehensive MRSA colonization surveillance program. It is not intended to diagnose MRSA infection nor to guide or monitor treatment for MRSA infections.      Labs: Basic Metabolic Panel:  Recent Labs Lab 03/29/15 0258 03/29/15 0545  03/29/15 1244 03/29/15 1719 03/29/15 2152 03/31/15 0506 04/01/15 0520  NA 150* 148*  < > 150* 150* 150* 151* 154*  K 3.6 3.4*  < > 3.4* 3.5 3.3* 3.4* 3.7  CL 118* 116*  < > 118* 116* 116* 116* 119*  CO2 26 27  < > '27 27 28 29 28  ' GLUCOSE 142* 164*  < > 144* 113* 120* 118* 81  BUN 55* 51*  < > 44* 36* 33* 23* 20  CREATININE 2.02* 1.83*  < > 1.58* 1.32* 1.29* 1.45* 1.20  CALCIUM 8.3* 8.2*  < > 8.4* 8.5* 8.3* 8.4* 8.8*  MG 2.4 2.3  --   --   --   --   --   --   PHOS 3.2 2.7  --   --   --   --   --   --   < > = values in this interval not displayed. Liver Function Tests:  Recent Labs Lab 03/28/15 2023 03/29/15 0545  AST 47* 35  ALT 12* 10*  ALKPHOS 53 41  BILITOT 0.7 0.7  PROT 6.5 5.5*  ALBUMIN 3.1* 2.5*   No results for input(s): LIPASE, AMYLASE in the last 168 hours. No results for input(s): AMMONIA in the last 168 hours. CBC:  Recent Labs Lab 03/28/15 2023 03/29/15 0258 03/29/15 0545 03/31/15 0506 04/01/15 0520  WBC  15.9* 12.1* 9.8 10.1 9.1  NEUTROABS 13.6*  --   --   --   --   HGB 9.5* 8.3* 8.1* 8.5* 9.2*  HCT 28.9* 25.6* 25.2* 26.9* 30.0*  MCV 102.1* 102.0* 102.4* 103.9* 104.9*  PLT 278 222 217 257 284   Cardiac Enzymes:  Recent Labs Lab 03/28/15 2023 03/29/15 0258 03/29/15 0545 03/29/15 1244  TROPONINI 0.05* 0.06* 0.06* 0.05*   BNP: BNP (last 3 results) No results for input(s): BNP in the last 8760 hours.  ProBNP (last 3 results) No results for input(s): PROBNP in the last 8760 hours.  CBG: No results for input(s): GLUCAP in the last 168 hours.

## 2015-04-01 NOTE — Progress Notes (Signed)
Daily Progress Note   Patient Name: Wayne Schroeder       Date: 04/01/2015 DOB: 10-14-1924  Age: 81 y.o. MRN#: [id number] Attending Physician: Robbie Lis, MD Primary Care Physician: Marton Redwood, MD Admit Date: 03/28/2015  Reason for Consultation/Follow-up: Establishing goals of care  Subjective: 81 year old male from skilled nursing facility with past medical history significant for dementia, recent hospitalization for right hip fracture status post right hemiarthroplasty in hospital course complicated with development of pneumonia. He presented from skilled nursing facility where he was admitted for rehab for his hip with poor by mouth intake. His blood work demonstrated increased white blood cell count and rising creatinine. He is currently admitted with sepsis secondary to UTI. Palliative was consulted for goals of care discussion.   Interval Events: Met with Mr. Joubert and spoke to his bedside nurse. He continues to have very poor nutritional intake. He remains confused and cannot provide further history.  I called spoke with his daughter. She reports they are coming in to the hospital visit with him this evening. I advised her that he still continues to have poor nutritional intake and this remains a concern about his overall ability to improve. She reports understanding conversation yesterday and plan remains to go to rehabilitation for trial rehabilitation. She states again that if he is unable to participate in rehabilitation, plan will be to enroll him on hospice services with a focus on his comfort.  Length of Stay: 3 days  Current Medications: Scheduled Meds:  . antiseptic oral rinse  7 mL Mouth Rinse q12n4p  . chlorhexidine  15 mL Mouth Rinse BID  . enoxaparin (LOVENOX) injection  30 mg Subcutaneous Q24H  . feeding supplement (ENSURE ENLIVE)  237 mL Oral BID BM  . folic acid  1 mg Intravenous Daily  . pantoprazole (PROTONIX) IV  40 mg Intravenous QHS  .  piperacillin-tazobactam (ZOSYN)  IV  3.375 g Intravenous 3 times per day  . sodium chloride  3 mL Intravenous Q12H  . thiamine  100 mg Intravenous Daily    Continuous Infusions: . dextrose 50 mL/hr at 04/01/15 0909    PRN Meds: acetaminophen **OR** acetaminophen, HYDROcodone-acetaminophen, ondansetron **OR** ondansetron (ZOFRAN) IV  Palliative Performance Scale: 30%     Vital Signs: BP 124/66 mmHg  Pulse 75  Temp(Src) 97.9 F (36.6 C) (Axillary)  Resp 19  Ht '5\' 8"'  (1.727 m)  Wt 54.432 kg (120 lb)  BMI 18.25 kg/m2  SpO2 100% SpO2: SpO2: 100 % O2 Device: O2 Device: Not Delivered O2 Flow Rate:    Intake/output summary:  Intake/Output Summary (Last 24 hours) at 04/01/15 1721 Last data filed at 04/01/15 1427  Gross per 24 hour  Intake 1712.5 ml  Output   1300 ml  Net  412.5 ml   LBM:   Baseline Weight: Weight: 54.432 kg (120 lb) Most recent weight: Weight: 54.432 kg (120 lb)  Physical Exam: General: Sleepy, in no acute distress, lying in bed in largely nonverbal.  HEENT: No bruits, no goiter, no JVD Heart: Regular rate and rhythm.  Lungs: Fair air movement, clear Abdomen: Soft, nontender, nondistended, positive bowel sounds.  Ext: No significant edema Skin: Warm and dry           Additional Data Reviewed: Recent Labs     03/31/15  0506  04/01/15  0520  WBC  10.1  9.1  HGB  8.5*  9.2*  PLT  257  284  NA  151*  154*  BUN  23*  20  CREATININE  1.45*  1.20     Problem List:  Patient Active Problem List   Diagnosis Date Noted  . Hypokalemia 03/31/2015  . Acute renal failure (ARF) ([place])   . Urinary tract infectious disease   . Protein-calorie malnutrition, severe 03/30/2015  . Sepsis ([place]) 03/29/2015  . Acute hypernatremia 03/29/2015  . Elevated troponin 03/28/2015  . UTI (urinary tract infection) 03/28/2015  . Leukocytosis 03/25/2015  . ARF (acute renal failure) ([place]) 03/25/2015  . Macrocytic anemia 03/24/2015  . Dementia 03/22/2015      Palliative Care Assessment & Plan    Code Status:  DNR  Goals of Care: - Family would like to pursue trial of rehab at [facility] following discharge if he is able. - We talked at length about his nutritional status and how this would be a large determining factor in his clinical course moving forward. They have good understanding of this and realized that if he is unable to maintain his own nutrition that his time would be very limited (in the matter of weeks). If it remains the case that he is not taking in any significant nutrition by mouth, they would be open to consideration for enrollment in a residential hospice facility for end-of-life care. We discussed benefits of careful hand feeding over feeding tube placement in advanced dementia, and they are not interested in consideration of a feeding tube in the future.  Psycho-social/Spiritual:  Desire for further Chaplaincy support:no   Prognosis: Unable to determine as it would depend largely on his nutrition. Overall, I feel that he has less than 6 months to live and will qualify for hospice services. If his nutrition does not improve this would be a much shorter course in the realm of less than 2 weeks Discharge Planning: [place] for rehab with Palliative care service follow-up   Care plan was discussed with patient's daughter and bedside nurse  Thank you for allowing the Palliative Medicine Team to assist in the care of this patient.   Time In: 1645 Time Out: 1700 Total Time 15 Prolonged Time Billed no    Greater than 50%  of this time was spent counseling and coordinating care related to the above assessment and plan.   Micheline Rough, MD  04/01/2015, 5:21 PM  Please contact Palliative Medicine Team phone at [phone] for questions and concerns.

## 2015-04-01 NOTE — Discharge Instructions (Signed)

## 2015-04-02 NOTE — Progress Notes (Signed)
Pt seen and examined at the bedside. Medically stable for discharge to SNF. No changes in medications since 10/16. Please refer to discharge summary completed 10/16.  Manson Passeylma Devine [facility] [phone]

## 2015-04-02 NOTE — Care Management Note (Signed)
Case Management Note  Patient Details  Name: Wayne SimmeringJohn R Schroeder MRN: [id number] Date of Birth: 1924-10-04  Subjective/Objective:                    Action/Plan:d/c SNF.   Expected Discharge Date:                  Expected Discharge Plan:  Skilled Nursing Facility  In-House Referral:     Discharge planning Services  CM Consult  Post Acute Care Choice:    Choice offered to:     DME Arranged:    DME Agency:     HH Arranged:    HH Agency:     Status of Service:  Completed, signed off  Medicare Important Message Given:    Date Medicare IM Given:    Medicare IM give by:    Date Additional Medicare IM Given:    Additional Medicare Important Message give by:     If discussed at Long Length of Stay Meetings, dates discussed:    Additional Comments:  Lanier ClamMahabir, Shawnie Nicole, RN 04/02/2015, 9:53 AM

## 2015-04-02 NOTE — Care Management Important Message (Signed)
Important Message  Patient Details  Name: Wayne SimmeringJohn R Schroeder MRN: [id number] Date of Birth: 1924-10-16   Medicare Important Message Given:  Yes-second notification given    Haskell FlirtJamison, Emiya Loomer 04/02/2015, 12:17 PMImportant Message  Patient Details  Name: Wayne SimmeringJohn R Schroeder MRN: [id number] Date of Birth: 1924-10-16   Medicare Important Message Given:  Yes-second notification given    Haskell FlirtJamison, Jasaiah Karwowski 04/02/2015, 12:17 PM

## 2015-04-02 NOTE — Progress Notes (Signed)
[facility]

## 2015-04-02 NOTE — Progress Notes (Signed)
[facility]. Family (wife and son in law) agreeable to this plan and [facility]. Will  plan transfer via EMS. Reece LevyJanet Kenyon Eichelberger, MSW, Theresia MajorsLCSWA [phone]

## 2015-04-03 ENCOUNTER — Non-Acute Institutional Stay (SKILLED_NURSING_FACILITY): Payer: Medicare Other | Admitting: Adult Health

## 2015-04-03 ENCOUNTER — Encounter: Payer: Self-pay | Admitting: Adult Health

## 2015-04-03 DIAGNOSIS — E43 Unspecified severe protein-calorie malnutrition: Secondary | ICD-10-CM | POA: Diagnosis not present

## 2015-04-03 DIAGNOSIS — F0390 Unspecified dementia without behavioral disturbance: Secondary | ICD-10-CM

## 2015-04-03 DIAGNOSIS — E870 Hyperosmolality and hypernatremia: Secondary | ICD-10-CM

## 2015-04-03 DIAGNOSIS — D539 Nutritional anemia, unspecified: Secondary | ICD-10-CM

## 2015-04-03 DIAGNOSIS — R531 Weakness: Secondary | ICD-10-CM | POA: Diagnosis not present

## 2015-04-03 DIAGNOSIS — N179 Acute kidney failure, unspecified: Secondary | ICD-10-CM

## 2015-04-03 LAB — CULTURE, BLOOD (ROUTINE X 2)
CULTURE: NO GROWTH
CULTURE: NO GROWTH

## 2015-04-03 NOTE — Progress Notes (Signed)
Patient ID: Wayne Schroeder, male   DOB: 1924/12/16, 81 y.o.   MRN: [id number]    DATE:  04/03/2015   MRN:  [id number]  BIRTHDAY: 15-Nov-1924  Facility:  Nursing Home Location:  [place] Room Number: [phone]  LEVEL OF CARE:  SNF [phone])  Contact Information    Name Relation Home Work Mobile   [place] Spouse [place] Daughter [phone]  [phone]   Burnard Leigh   [phone]      Chief Complaint  Patient presents with  . Hospitalization Follow-up    Generalized weakness, dementia, microcytic anemia, acute renal failure, hypernatremia and severe protein calorie malnutrition    HISTORY OF PRESENT ILLNESS:  This is an 81 year old male who was been readmitted to [facility] on 04/02/15 from [facility]. He has PMH of dementia, recent hospitalization for right hip fracture S/P hemiarthroplasty and hospital course complicated by pneumonia. Sepsis criteria met on admission with fever, tachycardia, leukocytosis and tachypnea. Urinalysis on admission has many RBCs. UTI was thought to be the source of infection but urine showed no growth and blood cultures showed no growth as well. Vancomycin and Zosyn were stopped.  He has been admitted for a short-term rehabilitation.   PAST MEDICAL HISTORY:  Past Medical History  Diagnosis Date  . Dementia      CURRENT MEDICATIONS: Reviewed  Patient's Medications  New Prescriptions   No medications on file  Previous Medications   ACETAMINOPHEN (TYLENOL) 325 MG TABLET    Take 650 mg by mouth every 4 (four) hours as needed for mild pain.   ANTISEPTIC ORAL RINSE (CPC / CETYLPYRIDINIUM CHLORIDE 0.05%) 0.05 % LIQD SOLUTION    7 mLs by Mouth Rinse route 2 times daily at 12 noon and 4 pm.   CHLORHEXIDINE (PERIDEX) 0.12 % SOLUTION    15 mLs by Mouth Rinse route 2 (two) times daily.   ENOXAPARIN (LOVENOX) 30 MG/0.3ML INJECTION    Inject 0.3 mLs (30 mg total) into the skin  daily.   FEEDING SUPPLEMENT, ENSURE ENLIVE, (ENSURE ENLIVE) LIQD    Take 237 mLs by mouth 2 (two) times daily between meals.   ONDANSETRON (ZOFRAN) 4 MG TABLET    Take 1 tablet (4 mg total) by mouth every 6 (six) hours as needed for nausea.   PANTOPRAZOLE (PROTONIX) 40 MG TABLET    Take 1 tablet (40 mg total) by mouth daily.   PROTEIN (PROCEL 100) POWD    Take 2 scoop by mouth 2 (two) times daily.  Modified Medications   No medications on file  Discontinued Medications   No medications on file    No Known Allergies   REVIEW OF SYSTEMS: unable to obtain due to advanced dementia   PHYSICAL EXAMINATION  GENERAL APPEARANCE: Well nourished. In no acute distress.  SKIN:  Right hip surgical incision has Aquacel dressing, dry, no erythema  HEAD: Normal in size and contour. No evidence of trauma EYES: Lids open and close normally. No blepharitis, +ectropion. PERRL. Lenses are without opacity EARS: Pinnae are normal. Patient hears normal voice tunes of the examiner MOUTH and THROAT: Lips are without lesions. Oral mucosa is moist and without lesions. Tongue is normal in shape, size, and color and without lesions NECK: supple, trachea midline, no neck masses, no thyroid tenderness, no thyromegaly LYMPHATICS: no LAN in the neck, no supraclavicular LAN RESPIRATORY: breathing is even & unlabored, BS CTAB CARDIAC: RRR, no murmur,no extra heart sounds, no edema  GI: abdomen soft, normal BS, no masses, no tenderness, no hepatomegaly, no splenomegaly EXTREMITIES:  Able to move 4 extremities  PSYCHIATRIC: Alerto self only . Affect and behavior are appropriate  LABS/RADIOLOGY: Labs reviewed: Basic Metabolic Panel:  Recent Labs  03/29/15 0258 03/29/15 0545  03/29/15 2152 03/31/15 0506 04/01/15 0520  NA 150* 148*  < > 150* 151* 154*  K 3.6 3.4*  < > 3.3* 3.4* 3.7  CL 118* 116*  < > 116* 116* 119*  CO2 26 27  < > '28 29 28  ' GLUCOSE 142* 164*  < > 120* 118* 81  BUN 55* 51*  < > 33* 23* 20    CREATININE 2.02* 1.83*  < > 1.29* 1.45* 1.20  CALCIUM 8.3* 8.2*  < > 8.3* 8.4* 8.8*  MG 2.4 2.3  --   --   --   --   PHOS 3.2 2.7  --   --   --   --   < > = values in this interval not displayed. Liver Function Tests:  Recent Labs  03/22/15 0500 03/28/15 2023 03/29/15 0545  AST 27 47* 35  ALT 16* 12* 10*  ALKPHOS 45 53 41  BILITOT 0.9 0.7 0.7  PROT 5.8* 6.5 5.5*  ALBUMIN 3.3* 3.1* 2.5*    Recent Labs  11/06/14 2215  LIPASE 43   CBC:  Recent Labs  03/21/15 2207 03/22/15 0500  03/28/15 2023  03/29/15 0545 03/31/15 0506 04/01/15 0520  WBC 9.7 11.4*  < > 15.9*  < > 9.8 10.1 9.1  NEUTROABS 7.6 9.7*  --  13.6*  --   --   --   --   HGB 10.0* 9.8*  < > 9.5*  < > 8.1* 8.5* 9.2*  HCT 32.1* 30.8*  < > 28.9*  < > 25.2* 26.9* 30.0*  MCV 105.2* 104.1*  < > 102.1*  < > 102.4* 103.9* 104.9*  PLT 232 199  < > 278  < > 217 257 284  < > = values in this interval not displayed.  Cardiac Enzymes:  Recent Labs  03/29/15 0258 03/29/15 0545 03/29/15 1244  TROPONINI 0.06* 0.06* 0.05*    Dg Chest 1 View  03/21/2015  CLINICAL DATA:  Unwitnessed fall. EXAM: CHEST 1 VIEW COMPARISON:  11/06/2014 FINDINGS: The heart size and mediastinal contours are within normal limits. Both lungs are clear. The visualized skeletal structures are unremarkable. IMPRESSION: No active disease. Electronically Signed   By: Andreas Newport M.D.   On: 03/21/2015 21:49   Dg Chest 2 View  03/29/2015  CLINICAL DATA:  Sepsis, weakness, fever EXAM: CHEST  2 VIEW COMPARISON:  03/28/2015 FINDINGS: The heart size and mediastinal contours are within normal limits. Both lungs are clear. The visualized skeletal structures are unremarkable. IMPRESSION: No active cardiopulmonary disease. Electronically Signed   By: Kathreen Devoid   On: 03/29/2015 09:50   Dg Pelvis 1-2 Views  03/21/2015  CLINICAL DATA:  Status post fall. Concern for pelvic injury. Right thigh pain. Initial encounter. EXAM: PELVIS - 1-2 VIEW COMPARISON:   Abdominal radiographs performed 11/06/2014 and 12/23/2011 FINDINGS: There is new cortical irregularity at the right femoral neck, raising suspicion for a subcapital fracture of the right femoral neck. The right femoral head remains seated at the acetabulum. Mild sclerotic change is noted at the sacroiliac joints. The left hip joint is unremarkable in appearance, with a small osseous density lateral to the joint space likely reflecting a small loose body. The visualized bowel gas pattern  is grossly unremarkable. IMPRESSION: New cortical irregularity of the right femoral neck, raising suspicion for a subcapital fracture of the right femoral neck. Electronically Signed   By: Garald Balding M.D.   On: 03/21/2015 21:49   US Pelvis Limited  03/24/2015  CLINICAL DATA:  Patient with right pubic hematoma. Swelling after discontinuation of Foley catheter. EXAM: ULTRASOUND OF THE MALE PELVIS COMPARISON:  None. FINDINGS: The area of palpable concern was scanned with ultrasound and no definite hematoma or significant edema was identified. IMPRESSION: No large hematoma or area of edema identified at the site of concern. Electronically Signed   By: Lovey Newcomer M.D.   On: 03/24/2015 18:06   US Renal  03/29/2015  CLINICAL DATA:  Patient with acute renal failure. EXAM: RENAL / URINARY TRACT ULTRASOUND COMPLETE COMPARISON:  None. FINDINGS: Right Kidney: Length: 10.7 cm. Mild hydronephrosis. Normal renal cortical thickness and echogenicity. Mild amount of perinephric fluid. Left Kidney: Length: 11.1 cm. Echogenicity within normal limits. No mass or hydronephrosis visualized. Bladder: Foley catheter decompresses the urinary bladder. IMPRESSION: Mild right hydronephrosis. Electronically Signed   By: Lovey Newcomer M.D.   On: 03/29/2015 11:01   Pelvis Portable  03/23/2015  CLINICAL DATA:  Right hip hemiarthroplasty. EXAM: PORTABLE PELVIS 1-2 VIEWS COMPARISON:  Earlier same day FINDINGS: Single view shows bipolar hemiarthroplasty of  the right hip. Components appear well positioned. No radiographically detectable complication. IMPRESSION: Good appearance following bipolar hemiarthroplasty on the right. Electronically Signed   By: Nelson Chimes M.D.   On: 03/23/2015 20:56   Dg Chest Port 1 View  03/28/2015  CLINICAL DATA:  81 year old male with poor p.o. intake and increased white count. EXAM: PORTABLE CHEST 1 VIEW COMPARISON:  03/22/2015 and prior radiographs FINDINGS: The cardiomediastinal silhouette is unremarkable. There is no evidence of focal airspace disease, pulmonary edema, suspicious pulmonary nodule/mass, pleural effusion, or pneumothorax. No acute bony abnormalities are identified. IMPRESSION: No active disease. Electronically Signed   By: Margarette Canada M.D.   On: 03/28/2015 22:12   Dg Chest Port 1 View  03/22/2015  CLINICAL DATA:  Hypoxia. EXAM: PORTABLE CHEST 1 VIEW COMPARISON:  03/21/2015 FINDINGS: Heart size is normal. There is patchy infiltrate in the right lung base consistent with infectious process. No edema. Left lung is clear. IMPRESSION: Right lower lobe infiltrate. Electronically Signed   By: Nolon Nations M.D.   On: 03/22/2015 14:04   Dg Knee Complete 4 Views Right  03/21/2015  CLINICAL DATA:  Unwitnessed fall at home now with right knee and thigh pain. EXAM: RIGHT KNEE - COMPLETE 4+ VIEW COMPARISON:  Right femur radiographs - earlier same day FINDINGS: The lateral radiograph is degraded due to obliquity. Osteopenia without definite displaced fracture. Mild degenerative change of the knee, most conspicuous within the medial compartment with joint space loss, subchondral sclerosis and osteophytosis. No evidence of chondrocalcinosis. No joint effusion or evidence of lipohemarthrosis. Adjacent vascular calcifications. No radiopaque foreign body. IMPRESSION: Osteopenia without fracture or dislocation. Electronically Signed   By: Sandi Mariscal M.D.   On: 03/21/2015 21:49   Dg C-arm 1-60 Min-no Report  03/23/2015   CLINICAL DATA: surgery C-ARM 1-60 MINUTES Fluoroscopy was utilized by the requesting physician.  No radiographic interpretation.   Dg Hip Operative Unilat With Pelvis Right  03/23/2015  CLINICAL DATA:  Right anterior hemi arthroplasty. EXAM: OPERATIVE right HIP (WITH PELVIS IF PERFORMED) 2 VIEWS TECHNIQUE: Fluoroscopic spot image(s) were submitted for interpretation post-operatively. COMPARISON:  None. FINDINGS: Two-view show bipolar hemiarthroplasty on the right. Components  appear grossly well positioned. No identifiable complication. IMPRESSION: Right bipolar hemiarthroplasty. Electronically Signed   By: Nelson Chimes M.D.   On: 03/23/2015 20:17   Dg Femur, Min 2 Views Right  03/21/2015  CLINICAL DATA:  Fall EXAM: RIGHT FEMUR 2 VIEWS COMPARISON:  None. FINDINGS: Subcapital femoral neck fracture with impaction is present. Osteopenia. Vascular calcifications are noted. No dislocation. IMPRESSION: Acute subcapital right femoral neck fracture with impaction. Osteopenia. Electronically Signed   By: Marybelle Killings M.D.   On: 03/21/2015 21:49    ASSESSMENT/PLAN:  Generalized weakness - for rehabilitation  Dementia - advanced; palliative consult with [facility]  Macrocytic anemia - hemoglobin 8.5; check CBC  Acute renal failure secondary to sepsis, dehydration - renal ultrasound showed mild hydronephrosis; check BMP  Hypernatremia - sodium 154; will monitor  Severe protein calorie malnutrition - albumin 2.5; RD consult    Goals of care:  Short-term rehabilitation    [facility], NP [place]

## 2015-04-04 ENCOUNTER — Non-Acute Institutional Stay (SKILLED_NURSING_FACILITY): Payer: Medicare Other | Admitting: Internal Medicine

## 2015-04-04 DIAGNOSIS — K219 Gastro-esophageal reflux disease without esophagitis: Secondary | ICD-10-CM | POA: Diagnosis not present

## 2015-04-04 DIAGNOSIS — S72141S Displaced intertrochanteric fracture of right femur, sequela: Secondary | ICD-10-CM | POA: Diagnosis not present

## 2015-04-04 DIAGNOSIS — R319 Hematuria, unspecified: Secondary | ICD-10-CM | POA: Diagnosis not present

## 2015-04-04 DIAGNOSIS — F0390 Unspecified dementia without behavioral disturbance: Secondary | ICD-10-CM

## 2015-04-04 DIAGNOSIS — R1310 Dysphagia, unspecified: Secondary | ICD-10-CM | POA: Diagnosis not present

## 2015-04-04 DIAGNOSIS — R531 Weakness: Secondary | ICD-10-CM | POA: Diagnosis not present

## 2015-04-04 DIAGNOSIS — N390 Urinary tract infection, site not specified: Secondary | ICD-10-CM

## 2015-04-04 DIAGNOSIS — D62 Acute posthemorrhagic anemia: Secondary | ICD-10-CM | POA: Diagnosis not present

## 2015-04-04 DIAGNOSIS — J690 Pneumonitis due to inhalation of food and vomit: Secondary | ICD-10-CM | POA: Diagnosis not present

## 2015-04-04 DIAGNOSIS — N179 Acute kidney failure, unspecified: Secondary | ICD-10-CM | POA: Diagnosis not present

## 2015-04-04 DIAGNOSIS — E43 Unspecified severe protein-calorie malnutrition: Secondary | ICD-10-CM | POA: Diagnosis not present

## 2015-04-09 LAB — BASIC METABOLIC PANEL
BUN: 10 mg/dL (ref 4–21)
CREATININE: 0.9 mg/dL (ref 0.6–1.3)
GLUCOSE: 95 mg/dL
POTASSIUM: 3.7 mmol/L (ref 3.4–5.3)
Sodium: 146 mmol/L (ref 137–147)

## 2015-04-17 LAB — CBC AND DIFFERENTIAL
HCT: 26 % — AB (ref 41–53)
Hemoglobin: 8.2 g/dL — AB (ref 13.5–17.5)
PLATELETS: 284 10*3/uL (ref 150–399)
WBC: 5.4 10*3/mL

## 2015-05-07 ENCOUNTER — Other Ambulatory Visit: Payer: Self-pay | Admitting: Orthopedic Surgery

## 2015-05-07 DIAGNOSIS — M25552 Pain in left hip: Secondary | ICD-10-CM

## 2015-05-08 ENCOUNTER — Ambulatory Visit
Admission: RE | Admit: 2015-05-08 | Discharge: 2015-05-08 | Disposition: A | Discharge status: Home / Self Care | Payer: Medicare Other | Source: Ambulatory Visit | Attending: Orthopedic Surgery | Admitting: Orthopedic Surgery

## 2015-05-08 DIAGNOSIS — M25552 Pain in left hip: Secondary | ICD-10-CM

## 2015-05-16 NOTE — Progress Notes (Signed)
Patient ID: Wayne SimmeringJohn R Schroeder, male   DOB: 1924/09/06, 81 y.o.   MRN: [id number]    [facility]  Chief Complaint  Patient presents with  . New Admit To SNF   No Known Allergies  Code status: DNR  HPI 81 y/o male patient is here for STR post hospital admission with sepsis from pneumonia and UTI, acute renal failure, hypernatremia, right hip fracture s/p right hip hemiarthroplasty on 03/23/15. He is seen in his room today. He was given iv antibiotics and D5W. He is done with his antibiotics. He is seen in his room today. His dementia limits his HPI and ROS. He has a foley catheter. He is under total care with maximum assist. No falls reported by staff.  Review of Systems  Unable to obtain   Past Medical History  Diagnosis Date  . Dementia    Past Surgical History  Procedure Laterality Date  . Back surgery    . Total hip arthroplasty Right     Family History  Problem Relation Age of Onset  . Dementia Other    Social History   Social History  . Marital Status: Married    Spouse Name: N/A  . Number of Children: N/A  . Years of Education: N/A   Occupational History  . Not on file.   Social History Main Topics  . Smoking status: Never Smoker   . Smokeless tobacco: Not on file  . Alcohol Use: No  . Drug Use: No  . Sexual Activity: Not on file   Other Topics Concern  . Not on file   Social History Narrative   Medication reviewed. [facility]  Physical exam BP 131/80 mmHg  Pulse 80  Temp(Src) 98 F (36.7 C)  Resp 19  Wt 126 lb 11.2 oz (57.471 kg)  SpO2 91%  General- elderly male in no acute distress Head- atraumatic, normocephalic Eyes- PERRLA, EOMI, no pallor, no icterus, ectropion + Neck- no lymphadenopathy Cardiovascular- normal s1,s2, no murmurs, no leg edema Respiratory- bilateral clear to auscultation, no wheeze, no rhonchi, no crackles Abdomen- bowel sounds present, soft, non tender Musculoskeletal- can move all 4  extremities, right leg rom limited, right hip aquacel dressing in place Neurological- dementia +, follows minimal command Psychiatry- calm this visit   Labs reviewed   Assessment/plan  Generalized weakness Will have him work with physical therapy and occupational therapy team to help with gait training and muscle strengthening exercises.fall precautions. Skin care. Encourage to be out of bed.   Right hip fracture S/p arthroplasty. To work with therapy team as tolerated. Continue tylenol 650 mg q4h prn pain. Continue lovenox for dvt prophylaxis  Advanced Dementia  Poor overall prognosis. To be followed by palliative care. Fall precautions. Pressure ulcer prophylaxis. Aspiration precautions  Dysphagia Followed by SLP team. Continue pureed diet and thin liquids, assistance with feeding, aspiration precautions. Oral hygiene to be maintained  UTI Completed antibiotics. Has foley in place. Continue foley care. Encourage fluid intake  Protein calorie malnutrition Monitor po intake and weight. RD consult. Continue feeding supplement  Blood loss anemia With surgery and now recent sepsis. Monitor cbc  gerd Continue protonix 40 mg daily  Acute renal failure Monitor BMP, encourage hydration   Labs- cbc, cmp  Goals of care- comfort care, palliative care to follow, possible LTC  Family/ staff Communication: reviewed care plan with patient and nursing supervisor  Wayne GroutMAHIMA Adryel Wortmann, MD  [facility] [phone] (Monday-Friday 8 am - 5 pm) [phone] (afterhours)

## 2015-05-26 LAB — BASIC METABOLIC PANEL
BUN: 18 mg/dL (ref 4–21)
Creatinine: 0.9 mg/dL (ref 0.6–1.3)
Glucose: 89 mg/dL
Potassium: 4.3 mmol/L (ref 3.4–5.3)
SODIUM: 141 mmol/L (ref 137–147)

## 2015-05-26 LAB — CBC AND DIFFERENTIAL
HEMATOCRIT: 29 % — AB (ref 41–53)
HEMOGLOBIN: 9.5 g/dL — AB (ref 13.5–17.5)
PLATELETS: 313 10*3/uL (ref 150–399)
WBC: 5.9 10*3/mL

## 2015-07-04 ENCOUNTER — Non-Acute Institutional Stay (SKILLED_NURSING_FACILITY): Payer: Medicare Other | Admitting: Adult Health

## 2015-07-04 ENCOUNTER — Encounter: Payer: Self-pay | Admitting: Adult Health

## 2015-07-04 DIAGNOSIS — F0390 Unspecified dementia without behavioral disturbance: Secondary | ICD-10-CM

## 2015-07-04 DIAGNOSIS — K219 Gastro-esophageal reflux disease without esophagitis: Secondary | ICD-10-CM | POA: Diagnosis not present

## 2015-07-04 DIAGNOSIS — R531 Weakness: Secondary | ICD-10-CM | POA: Diagnosis not present

## 2015-07-04 DIAGNOSIS — S72001S Fracture of unspecified part of neck of right femur, sequela: Secondary | ICD-10-CM

## 2015-07-04 DIAGNOSIS — E43 Unspecified severe protein-calorie malnutrition: Secondary | ICD-10-CM | POA: Diagnosis not present

## 2015-07-04 DIAGNOSIS — F419 Anxiety disorder, unspecified: Secondary | ICD-10-CM | POA: Diagnosis not present

## 2015-07-04 DIAGNOSIS — D62 Acute posthemorrhagic anemia: Secondary | ICD-10-CM

## 2015-07-05 NOTE — Progress Notes (Signed)
Patient ID: Wayne Schroeder, male   DOB: 01/15/1925, 82 y.o.   MRN: [id number]    DATE:  07/04/15  MRN:  [id number]  BIRTHDAY: [birth year]  Facility:  Nursing Home Location:  [place] Room Number: [phone]  LEVEL OF CARE:  SNF [phone])  Contact Information    Name Relation Home Work Mobile   [place] Spouse [place] Daughter [phone]  [phone]   Burnard Leigh   [phone]      Chief Complaint  Patient presents with  . Discharge Note    Generalized weakness, dementia, microcytic anemia, acute renal failure, hypernatremia and severe protein calorie malnutrition    HISTORY OF PRESENT ILLNESS:  This is an 82 year old male who is for discharge to ALF with Home health PT for endurance and OT for ADLs. DME:  Semi-electric hospital bed, standard wheelchair with cushion, anti-tippers and leg rest. He has been readmitted to [facility] on 04/02/15 from [facility]. He has PMH of dementia, recent hospitalization for right hip fracture S/P hemiarthroplasty and hospital course complicated by pneumonia. Sepsis criteria met on admission with fever, tachycardia, leukocytosis and tachypnea. Urinalysis on admission has many RBCs. UTI was thought to be the source of infection but urine showed no growth and blood cultures showed no growth as well. Vancomycin and Zosyn were stopped.  Patient was admitted to this facility for short-term rehabilitation after the patient's recent hospitalization.  Patient has completed SNF rehabilitation and therapy has cleared the patient for discharge.  PAST MEDICAL HISTORY:  Past Medical History  Diagnosis Date  . Dementia      CURRENT MEDICATIONS: Reviewed  Patient's Medications  New Prescriptions   No medications on file  Previous Medications   ANTISEPTIC ORAL RINSE (CPC / CETYLPYRIDINIUM CHLORIDE 0.05%) 0.05 % LIQD SOLUTION    7 mLs by Mouth Rinse route 2 times daily at 12 noon and  4 pm.   CHLORHEXIDINE (PERIDEX) 0.12 % SOLUTION    15 mLs by Mouth Rinse route 2 (two) times daily.   EYELID CLEANSERS (OCUSOFT EYELID CLEANSING) PADS    Apply topically 2 (two) times daily.   LORAZEPAM (ATIVAN) 0.5 MG TABLET    1/2 tablet by mouth three times daily as needed for anxiety   ONDANSETRON (ZOFRAN) 4 MG TABLET    Take 1 tablet (4 mg total) by mouth every 6 (six) hours as needed for nausea.   PANTOPRAZOLE (PROTONIX) 40 MG TABLET    Take 1 tablet (40 mg total) by mouth daily.   PROPYLENE GLYCOL (SYSTANE BALANCE) 0.6 % SOLN    Apply 2 drops to eye 2 (two) times daily.   PROTEIN (PROCEL 100) POWD    Take 2 scoop by mouth 2 (two) times daily.   UNABLE TO FIND    Med Name: Med Pass 120 mL four times daily for nutritional support  Modified Medications   No medications on file  Discontinued Medications   ACETAMINOPHEN (TYLENOL) 325 MG TABLET    Take 650 mg by mouth every 4 (four) hours as needed for mild pain.   ENOXAPARIN (LOVENOX) 30 MG/0.3ML INJECTION    Inject 0.3 mLs (30 mg total) into the skin daily.   FEEDING SUPPLEMENT, ENSURE ENLIVE, (ENSURE ENLIVE) LIQD    Take 237 mLs by mouth 2 (two) times daily between meals.    No Known Allergies   REVIEW OF SYSTEMS: unable to obtain due to advanced dementia   PHYSICAL EXAMINATION  GENERAL  APPEARANCE: Well nourished. In no acute distress.  SKIN:  Right hip surgical incision has Aquacel dressing, dry, no erythema  HEAD: Normal in size and contour. No evidence of trauma EYES: Lids open and close normally. No blepharitis, +ectropion. PERRL. Lenses are without opacity EARS: Pinnae are normal. Patient hears normal voice tunes of the examiner MOUTH and THROAT: Lips are without lesions. Oral mucosa is moist and without lesions. Tongue is normal in shape, size, and color and without lesions NECK: supple, trachea midline, no neck masses, no thyroid tenderness, no thyromegaly LYMPHATICS: no LAN in the neck, no supraclavicular LAN RESPIRATORY:  breathing is even & unlabored, BS CTAB CARDIAC: RRR, no murmur,no extra heart sounds, no edema GI: abdomen soft, normal BS, no masses, no tenderness, no hepatomegaly, no splenomegaly EXTREMITIES:  Able to move 4 extremities  PSYCHIATRIC: Alerto self only . Affect and behavior are appropriate  LABS/RADIOLOGY: Labs reviewed: Basic Metabolic Panel:  Recent Labs  03/29/15 0258 03/29/15 0545  03/29/15 2152 03/31/15 0506 04/01/15 0520 04/09/15 05/26/15  NA 150* 148*  < > 150* 151* 154* 146 141  K 3.6 3.4*  < > 3.3* 3.4* 3.7 3.7 4.3  CL 118* 116*  < > 116* 116* 119*  --   --   CO2 26 27  < > '28 29 28  ' --   --   GLUCOSE 142* 164*  < > 120* 118* 81  --   --   BUN 55* 51*  < > 33* 23* '20 10 18  ' CREATININE 2.02* 1.83*  < > 1.29* 1.45* 1.20 0.9 0.9  CALCIUM 8.3* 8.2*  < > 8.3* 8.4* 8.8*  --   --   MG 2.4 2.3  --   --   --   --   --   --   PHOS 3.2 2.7  --   --   --   --   --   --   < > = values in this interval not displayed. Liver Function Tests:  Recent Labs  03/22/15 0500 03/28/15 2023 03/29/15 0545  AST 27 47* 35  ALT 16* 12* 10*  ALKPHOS 45 53 41  BILITOT 0.9 0.7 0.7  PROT 5.8* 6.5 5.5*  ALBUMIN 3.3* 3.1* 2.5*    Recent Labs  11/06/14 2215  LIPASE 43   CBC:  Recent Labs  03/21/15 2207 03/22/15 0500  03/28/15 2023  03/29/15 0545 03/31/15 0506 04/01/15 0520 04/17/15 05/26/15  WBC 9.7 11.4*  < > 15.9*  < > 9.8 10.1 9.1 5.4 5.9  NEUTROABS 7.6 9.7*  --  13.6*  --   --   --   --   --   --   HGB 10.0* 9.8*  < > 9.5*  < > 8.1* 8.5* 9.2* 8.2* 9.5*  HCT 32.1* 30.8*  < > 28.9*  < > 25.2* 26.9* 30.0* 26* 29*  MCV 105.2* 104.1*  < > 102.1*  < > 102.4* 103.9* 104.9*  --   --   PLT 232 199  < > 278  < > 217 257 284 284 313  < > = values in this interval not displayed.  Cardiac Enzymes:  Recent Labs  03/29/15 0258 03/29/15 0545 03/29/15 1244  TROPONINI 0.06* 0.06* 0.05*     ASSESSMENT/PLAN:  Generalized weakness - for home health PT and OT;  continue  supportive care  Right hip fracture S/P arthroplasty - for home health PT and OT  Dementia - advanced; palliative consult with [facility] was done  Anemia, acute blood loss - hemoglobin 8.5; re-check hgb 9.5  Acute renal failure secondary to sepsis, dehydration - resolved; creatinine 0.89  Hypernatremia - resolved; NA 141  Severe protein calorie malnutrition - continue Procel 2 scoops twice a day  Anxiety - continue Ativan 0.5 mg 1/2 tab = 0.25 mg PO TID PRN  GERD - continue Protonix DR 1 tab PO daily     I have filled out patient's discharge paperwork and written prescriptions.  Patient will receive home health PT and OT.  DME provided:  Semi-electric hospital bed, standard wheelchair with cushion, anti-tippers and leg rest  Total discharge time: Greater than 30 minutes  Discharge time involved coordination of the discharge process with social worker, nursing staff and therapy department. Medical justification for home health services/DME verified.     [facility], NP [employer] [phone]

## 2016-05-10 ENCOUNTER — Inpatient Hospital Stay (HOSPITAL_COMMUNITY)
Admission: EM | Admit: 2016-05-10 | Discharge: 2016-05-16 | DRG: 871 | Disposition: A | Discharge status: Skilled Nursing Facility | Payer: Medicare Other | Attending: Internal Medicine | Admitting: Internal Medicine

## 2016-05-10 ENCOUNTER — Encounter (HOSPITAL_COMMUNITY): Payer: Self-pay | Admitting: Emergency Medicine

## 2016-05-10 ENCOUNTER — Emergency Department (HOSPITAL_COMMUNITY): Payer: Medicare Other

## 2016-05-10 DIAGNOSIS — E43 Unspecified severe protein-calorie malnutrition: Secondary | ICD-10-CM | POA: Diagnosis present

## 2016-05-10 DIAGNOSIS — N3000 Acute cystitis without hematuria: Secondary | ICD-10-CM | POA: Diagnosis present

## 2016-05-10 DIAGNOSIS — A419 Sepsis, unspecified organism: Secondary | ICD-10-CM | POA: Diagnosis present

## 2016-05-10 DIAGNOSIS — G9341 Metabolic encephalopathy: Secondary | ICD-10-CM | POA: Diagnosis present

## 2016-05-10 DIAGNOSIS — R1310 Dysphagia, unspecified: Secondary | ICD-10-CM | POA: Diagnosis present

## 2016-05-10 DIAGNOSIS — Z993 Dependence on wheelchair: Secondary | ICD-10-CM

## 2016-05-10 DIAGNOSIS — F0281 Dementia in other diseases classified elsewhere with behavioral disturbance: Secondary | ICD-10-CM | POA: Diagnosis present

## 2016-05-10 DIAGNOSIS — B9620 Unspecified Escherichia coli [E. coli] as the cause of diseases classified elsewhere: Secondary | ICD-10-CM | POA: Diagnosis present

## 2016-05-10 DIAGNOSIS — L8990 Pressure ulcer of unspecified site, unspecified stage: Secondary | ICD-10-CM | POA: Insufficient documentation

## 2016-05-10 DIAGNOSIS — R4182 Altered mental status, unspecified: Secondary | ICD-10-CM | POA: Diagnosis not present

## 2016-05-10 DIAGNOSIS — R471 Dysarthria and anarthria: Secondary | ICD-10-CM | POA: Diagnosis present

## 2016-05-10 DIAGNOSIS — D539 Nutritional anemia, unspecified: Secondary | ICD-10-CM | POA: Diagnosis present

## 2016-05-10 DIAGNOSIS — E860 Dehydration: Secondary | ICD-10-CM | POA: Diagnosis present

## 2016-05-10 DIAGNOSIS — Z681 Body mass index (BMI) 19 or less, adult: Secondary | ICD-10-CM | POA: Diagnosis not present

## 2016-05-10 DIAGNOSIS — R64 Cachexia: Secondary | ICD-10-CM | POA: Diagnosis present

## 2016-05-10 DIAGNOSIS — R509 Fever, unspecified: Secondary | ICD-10-CM | POA: Diagnosis not present

## 2016-05-10 DIAGNOSIS — L89151 Pressure ulcer of sacral region, stage 1: Secondary | ICD-10-CM | POA: Diagnosis present

## 2016-05-10 DIAGNOSIS — F03918 Unspecified dementia, unspecified severity, with other behavioral disturbance: Secondary | ICD-10-CM | POA: Diagnosis present

## 2016-05-10 DIAGNOSIS — Z96641 Presence of right artificial hip joint: Secondary | ICD-10-CM | POA: Diagnosis present

## 2016-05-10 DIAGNOSIS — N179 Acute kidney failure, unspecified: Secondary | ICD-10-CM | POA: Diagnosis present

## 2016-05-10 DIAGNOSIS — Z66 Do not resuscitate: Secondary | ICD-10-CM | POA: Diagnosis present

## 2016-05-10 DIAGNOSIS — F0391 Unspecified dementia with behavioral disturbance: Secondary | ICD-10-CM | POA: Diagnosis present

## 2016-05-10 DIAGNOSIS — A4151 Sepsis due to Escherichia coli [E. coli]: Principal | ICD-10-CM | POA: Diagnosis present

## 2016-05-10 DIAGNOSIS — G309 Alzheimer's disease, unspecified: Secondary | ICD-10-CM | POA: Diagnosis present

## 2016-05-10 DIAGNOSIS — Z7189 Other specified counseling: Secondary | ICD-10-CM

## 2016-05-10 DIAGNOSIS — I10 Essential (primary) hypertension: Secondary | ICD-10-CM | POA: Diagnosis present

## 2016-05-10 DIAGNOSIS — E876 Hypokalemia: Secondary | ICD-10-CM | POA: Diagnosis present

## 2016-05-10 DIAGNOSIS — E870 Hyperosmolality and hypernatremia: Secondary | ICD-10-CM | POA: Diagnosis present

## 2016-05-10 DIAGNOSIS — N390 Urinary tract infection, site not specified: Secondary | ICD-10-CM

## 2016-05-10 DIAGNOSIS — R000 Tachycardia, unspecified: Secondary | ICD-10-CM | POA: Diagnosis present

## 2016-05-10 LAB — CBC WITH DIFFERENTIAL/PLATELET
Basophils Absolute: 0 10*3/uL (ref 0.0–0.1)
Basophils Relative: 0 %
Eosinophils Absolute: 0 10*3/uL (ref 0.0–0.7)
Eosinophils Relative: 0 %
HCT: 36 % — ABNORMAL LOW (ref 39.0–52.0)
Hemoglobin: 11.2 g/dL — ABNORMAL LOW (ref 13.0–17.0)
Lymphocytes Relative: 5 %
Lymphs Abs: 0.7 10*3/uL (ref 0.7–4.0)
MCH: 31.8 pg (ref 26.0–34.0)
MCHC: 31.1 g/dL (ref 30.0–36.0)
MCV: 102.3 fL — ABNORMAL HIGH (ref 78.0–100.0)
Monocytes Absolute: 0.6 10*3/uL (ref 0.1–1.0)
Monocytes Relative: 5 %
Neutro Abs: 12 10*3/uL — ABNORMAL HIGH (ref 1.7–7.7)
Neutrophils Relative %: 90 %
Platelets: 201 10*3/uL (ref 150–400)
RBC: 3.52 MIL/uL — ABNORMAL LOW (ref 4.22–5.81)
RDW: 14.6 % (ref 11.5–15.5)
WBC: 13.3 10*3/uL — ABNORMAL HIGH (ref 4.0–10.5)

## 2016-05-10 LAB — CBC
HCT: 32.6 % — ABNORMAL LOW (ref 39.0–52.0)
Hemoglobin: 10.2 g/dL — ABNORMAL LOW (ref 13.0–17.0)
MCH: 31.4 pg (ref 26.0–34.0)
MCHC: 31.3 g/dL (ref 30.0–36.0)
MCV: 100.3 fL — ABNORMAL HIGH (ref 78.0–100.0)
PLATELETS: 187 10*3/uL (ref 150–400)
RBC: 3.25 MIL/uL — AB (ref 4.22–5.81)
RDW: 14.1 % (ref 11.5–15.5)
WBC: 13.1 10*3/uL — AB (ref 4.0–10.5)

## 2016-05-10 LAB — COMPREHENSIVE METABOLIC PANEL
ALT: 16 U/L — ABNORMAL LOW (ref 17–63)
ANION GAP: 7 (ref 5–15)
AST: 28 U/L (ref 15–41)
Albumin: 3.6 g/dL (ref 3.5–5.0)
Alkaline Phosphatase: 63 U/L (ref 38–126)
BUN: 26 mg/dL — ABNORMAL HIGH (ref 6–20)
CHLORIDE: 106 mmol/L (ref 101–111)
CO2: 30 mmol/L (ref 22–32)
Calcium: 8.6 mg/dL — ABNORMAL LOW (ref 8.9–10.3)
Creatinine, Ser: 1.31 mg/dL — ABNORMAL HIGH (ref 0.61–1.24)
GFR calc Af Amer: 54 mL/min — ABNORMAL LOW (ref >60)
GFR calc non Af Amer: 47 mL/min — ABNORMAL LOW (ref >60)
Glucose, Bld: 122 mg/dL — ABNORMAL HIGH (ref 65–99)
POTASSIUM: 3.2 mmol/L — AB (ref 3.5–5.1)
SODIUM: 143 mmol/L (ref 135–145)
Total Bilirubin: 1.1 mg/dL (ref 0.3–1.2)
Total Protein: 6.8 g/dL (ref 6.5–8.1)

## 2016-05-10 LAB — URINALYSIS, ROUTINE W REFLEX MICROSCOPIC
BILIRUBIN URINE: NEGATIVE
Glucose, UA: NEGATIVE mg/dL
Ketones, ur: NEGATIVE mg/dL
Leukocytes, UA: NEGATIVE
NITRITE: POSITIVE — AB
PH: 5.5 (ref 5.0–8.0)
Protein, ur: 30 mg/dL — AB
SPECIFIC GRAVITY, URINE: 1.021 (ref 1.005–1.030)

## 2016-05-10 LAB — MRSA PCR SCREENING: MRSA BY PCR: POSITIVE — AB

## 2016-05-10 LAB — BASIC METABOLIC PANEL
Anion gap: 8 (ref 5–15)
BUN: 25 mg/dL — ABNORMAL HIGH (ref 6–20)
CALCIUM: 8 mg/dL — AB (ref 8.9–10.3)
CO2: 29 mmol/L (ref 22–32)
CREATININE: 1.36 mg/dL — AB (ref 0.61–1.24)
Chloride: 107 mmol/L (ref 101–111)
GFR calc non Af Amer: 44 mL/min — ABNORMAL LOW (ref >60)
GFR, EST AFRICAN AMERICAN: 52 mL/min — AB (ref >60)
Glucose, Bld: 129 mg/dL — ABNORMAL HIGH (ref 65–99)
Potassium: 3.4 mmol/L — ABNORMAL LOW (ref 3.5–5.1)
SODIUM: 144 mmol/L (ref 135–145)

## 2016-05-10 LAB — PROTIME-INR
INR: 1.04
Prothrombin Time: 13.6 seconds (ref 11.4–15.2)

## 2016-05-10 LAB — INFLUENZA PANEL BY PCR (TYPE A & B)
Influenza A By PCR: NEGATIVE
Influenza B By PCR: NEGATIVE

## 2016-05-10 LAB — URINE MICROSCOPIC-ADD ON

## 2016-05-10 LAB — I-STAT CG4 LACTIC ACID, ED: Lactic Acid, Venous: 1.26 mmol/L (ref 0.5–1.9)

## 2016-05-10 LAB — MAGNESIUM: Magnesium: 1.8 mg/dL (ref 1.7–2.4)

## 2016-05-10 MED ORDER — TRAMADOL HCL 50 MG PO TABS: 50.0000 mg | ORAL_TABLET | Freq: Two times a day (BID) | ORAL | Status: DC

## 2016-05-10 MED ORDER — ACETAMINOPHEN 650 MG RE SUPP
650.0000 mg | Freq: Four times a day (QID) | RECTAL | Status: DC | PRN
  Administered 2016-05-11: 650 mg via RECTAL
  Filled 2016-05-10: qty 1

## 2016-05-10 MED ORDER — ENOXAPARIN SODIUM 30 MG/0.3ML ~~LOC~~ SOLN
30.0000 mg | SUBCUTANEOUS | Status: DC
  Administered 2016-05-11 – 2016-05-15 (×3): 30 mg via SUBCUTANEOUS
  Filled 2016-05-10 (×5): qty 0.3

## 2016-05-10 MED ORDER — ACETAMINOPHEN 325 MG PO TABS
650.0000 mg | ORAL_TABLET | Freq: Four times a day (QID) | ORAL | Status: DC | PRN
  Administered 2016-05-11: 650 mg via ORAL
  Filled 2016-05-10: qty 2

## 2016-05-10 MED ORDER — OCUSOFT EYELID CLEANSING EX PADS: MEDICATED_PAD | Freq: Two times a day (BID) | CUTANEOUS | Status: DC

## 2016-05-10 MED ORDER — CEFTRIAXONE SODIUM 1 G IJ SOLR
1.0000 g | Freq: Every day | INTRAMUSCULAR | Status: DC
  Administered 2016-05-10 – 2016-05-13 (×4): 1 g via INTRAVENOUS
  Filled 2016-05-10 (×5): qty 10

## 2016-05-10 MED ORDER — QUETIAPINE FUMARATE 25 MG PO TABS: 12.5000 mg | ORAL_TABLET | Freq: Every day | ORAL | Status: DC

## 2016-05-10 MED ORDER — MUPIROCIN 2 % EX OINT
1.0000 "application " | TOPICAL_OINTMENT | Freq: Two times a day (BID) | CUTANEOUS | Status: AC
  Administered 2016-05-10 – 2016-05-14 (×10): 1 via NASAL
  Filled 2016-05-10: qty 22

## 2016-05-10 MED ORDER — PIPERACILLIN-TAZOBACTAM 3.375 G IVPB 30 MIN
3.3750 g | Freq: Once | INTRAVENOUS | Status: AC
  Administered 2016-05-10: 3.375 g via INTRAVENOUS
  Filled 2016-05-10: qty 50

## 2016-05-10 MED ORDER — POTASSIUM CHLORIDE IN NACL 40-0.9 MEQ/L-% IV SOLN
INTRAVENOUS | Status: DC
  Administered 2016-05-10 (×2): 100 mL/h via INTRAVENOUS
  Filled 2016-05-10 (×4): qty 1000

## 2016-05-10 MED ORDER — POTASSIUM CHLORIDE CRYS ER 20 MEQ PO TBCR: 40.0000 meq | EXTENDED_RELEASE_TABLET | Freq: Once | ORAL | Status: DC

## 2016-05-10 MED ORDER — ENSURE ENLIVE PO LIQD
237.0000 mL | Freq: Two times a day (BID) | ORAL | Status: DC
  Administered 2016-05-12 – 2016-05-13 (×2): 237 mL via ORAL

## 2016-05-10 MED ORDER — SODIUM CHLORIDE 0.9 % IV BOLUS (SEPSIS)
1000.0000 mL | Freq: Once | INTRAVENOUS | Status: AC
  Administered 2016-05-10: 1000 mL via INTRAVENOUS

## 2016-05-10 MED ORDER — ONDANSETRON HCL 4 MG/2ML IJ SOLN: 4.0000 mg | Freq: Four times a day (QID) | INTRAMUSCULAR | Status: DC | PRN

## 2016-05-10 MED ORDER — ESCITALOPRAM OXALATE 10 MG PO TABS: 10.0000 mg | ORAL_TABLET | Freq: Every day | ORAL | Status: DC

## 2016-05-10 MED ORDER — ONDANSETRON HCL 4 MG PO TABS: 4.0000 mg | ORAL_TABLET | Freq: Four times a day (QID) | ORAL | Status: DC | PRN

## 2016-05-10 MED ORDER — ENOXAPARIN SODIUM 40 MG/0.4ML ~~LOC~~ SOLN
40.0000 mg | SUBCUTANEOUS | Status: DC
  Administered 2016-05-10: 40 mg via SUBCUTANEOUS
  Filled 2016-05-10: qty 0.4

## 2016-05-10 MED ORDER — TRAZODONE HCL 50 MG PO TABS: 50.0000 mg | ORAL_TABLET | Freq: Every day | ORAL | Status: DC

## 2016-05-10 MED ORDER — HYDROCHLOROTHIAZIDE 25 MG PO TABS: 25.0000 mg | ORAL_TABLET | Freq: Every day | ORAL | Status: DC

## 2016-05-10 MED ORDER — POLYVINYL ALCOHOL 1.4 % OP SOLN
2.0000 [drp] | Freq: Two times a day (BID) | OPHTHALMIC | Status: DC
  Administered 2016-05-10 – 2016-05-16 (×12): 2 [drp] via OPHTHALMIC
  Filled 2016-05-10 (×2): qty 15

## 2016-05-10 MED ORDER — SODIUM CHLORIDE 0.9 % IV SOLN: INTRAVENOUS | Status: DC

## 2016-05-10 MED ORDER — SODIUM CHLORIDE 0.9 % IV SOLN
INTRAVENOUS | Status: DC
  Administered 2016-05-10: 04:00:00 via INTRAVENOUS

## 2016-05-10 MED ORDER — VANCOMYCIN HCL IN DEXTROSE 1-5 GM/200ML-% IV SOLN
1000.0000 mg | Freq: Once | INTRAVENOUS | Status: AC
  Administered 2016-05-10: 1000 mg via INTRAVENOUS
  Filled 2016-05-10: qty 200

## 2016-05-10 MED ORDER — CHLORHEXIDINE GLUCONATE CLOTH 2 % EX PADS
6.0000 | MEDICATED_PAD | Freq: Every day | CUTANEOUS | Status: AC
  Administered 2016-05-11 – 2016-05-14 (×4): 6 via TOPICAL

## 2016-05-10 NOTE — Progress Notes (Signed)
Pharmacy Antibiotic Note  Wayne SimmeringJohn R Schroeder is a 82 y.o. male admitted on 05/10/2016 with UTI.  Pharmacy has been consulted for Ceftriaxone dosing.  Plan: Ceftriaxone 1gm iv q24hr    Height: 5\' 10"  (177.8 cm) Weight: 110 lb 0.2 oz (49.9 kg) IBW/kg (Calculated) : 73  Temp (24hrs), Avg:99.9 F (37.7 C), Min:99.5 F (37.5 C), Max:100.2 F (37.9 C)   Recent Labs Lab 05/10/16 0043 05/10/16 0100  WBC 13.3*  --   CREATININE 1.31*  --   LATICACIDVEN  --  1.26    Estimated Creatinine Clearance: 27 mL/min (by C-G formula based on SCr of 1.31 mg/dL (H)).    No Known Allergies  Antimicrobials this admission: Vancomycin 05/10/2016 x1 Zosyn 05/10/2016 x1 Ceftriaxone 11/25 >>   Dose adjustments this admission: -  Microbiology results: pending  Thank you for allowing pharmacy to be a part of this patient's care.  Wayne Schroeder, Wayne Schroeder 05/10/2016 4:29 AM

## 2016-05-10 NOTE — H&P (Signed)
History and Physical  Patient Name: Wayne Schroeder     UJW:[id number]    DOB: [birth year]    DOA: 05/10/2016 PCP: Martha ClanShaw, William, MD   Patient coming from: [facility]  Chief Complaint: Fever  HPI: Wayne SimmeringJohn R Chunn is a 82 y.o. male with a past medical history significant for advanced dementia who presents with fever for 1 day.  Caveat that all history is collected from nursing staff at [facility] by phone and EMS via EDP.  The patient was in his usual state of health until the last week when he was noted to have some increased aggression, and staff at his facility attempted to send a urinalysis.  Then tonight at second shift, the patient was noted to be warm and temp at his facility was 101.48F, and so he was transported to the ER.  ED course: -Temp 100.34F, heart rate 100, respirations 16 and pulse ox normal, BP 118/61 -Na 143, K 3.2, Cr 1.31 (baseline 0.9), WBC 13.3 K, Hgb 11.2, macrocytic, chronic -INR normal -Lactate 1.26 -The patient was given broad-spectrum antibiotics and IV fluids while still awaiting urinalysis and [facility] were asked to admit for sepsis, unknown source -Subsequent urinalysis showed pyuria and bacteria     ROS: Review of Systems  Unable to perform ROS: Dementia          Past Medical History:  Diagnosis Date  . Dementia     Past Surgical History:  Procedure Laterality Date  . BACK SURGERY    . TOTAL HIP ARTHROPLASTY Right     Social History: [facility].  The patient is wheelchair bound at baseline.  He is able to make needs known per nursing, and can verbalize some things, but has advanced dementia.  He does not smoke.    No Known Allergies  Family history: family history includes Dementia in his other.  Prior to Admission medications   Medication Sig Start Date End Date Taking? Authorizing Provider  escitalopram (LEXAPRO) 10 MG tablet Take 10 mg by mouth daily.   Yes Historical Provider, MD    Eyelid Cleansers (OCUSOFT EYELID CLEANSING) PADS Apply topically 2 (two) times daily.   Yes Historical Provider, MD  hydrochlorothiazide (HYDRODIURIL) 25 MG tablet Take 25 mg by mouth daily.   Yes Historical Provider, MD  loperamide (IMODIUM A-D) 2 MG tablet Take 2 mg by mouth 4 (four) times daily as needed for diarrhea or loose stools.   Yes Historical Provider, MD  LORazepam (ATIVAN) 0.5 MG tablet Take 0.5 mg by mouth 2 (two) times daily as needed for anxiety.   Yes Historical Provider, MD  ondansetron (ZOFRAN) 4 MG tablet Take 1 tablet (4 mg total) by mouth every 6 (six) hours as needed for nausea. 04/01/15  Yes Alison MurrayAlma M Devine, MD  pantoprazole (PROTONIX) 40 MG tablet Take 1 tablet (40 mg total) by mouth daily. 04/01/15  Yes Alison MurrayAlma M Devine, MD  Propylene Glycol (SYSTANE BALANCE) 0.6 % SOLN Apply 2 drops to eye 2 (two) times daily.   Yes Historical Provider, MD  Protein (PROCEL 100) POWD Take 2 scoop by mouth 2 (two) times daily.   Yes Historical Provider, MD  QUEtiapine (SEROQUEL) 25 MG tablet Take 12.5 mg by mouth daily.   Yes Historical Provider, MD  traMADol (ULTRAM) 50 MG tablet Take 50 mg by mouth 2 (two) times daily.   Yes Historical Provider, MD  traZODone (DESYREL) 50 MG tablet Take 50 mg by mouth at  bedtime.   Yes Historical Provider, MD  UNABLE TO FIND Take 1 each by mouth 2 (two) times daily. Mighty shake   Yes Historical Provider, MD       Physical Exam: BP 117/60   Pulse 88   Temp 100.2 F (37.9 C) (Rectal)   Resp 18   SpO2 95%  General appearance: Cachectic elderly adult male, sleeping, groans to sternal rub.  Does not open eyes, does not follow commands. Eyes: Anicteric, conjunctiva pink, lids everted.  Does not open eyes.  No eye discharge. ENT: No nasal deformity, discharge, epistaxis.  Does not open mouth.   Neck: No neck masses.  Trachea midline.  No thyromegaly/tenderness. Lymph: No cervical or supraclavicular lymphadenopathy. Skin: Warm and dry.  No jaundice.  No  suspicious rashes or lesions. Cardiac: Tachycardic, regular, nl S1-S2, no murmurs appreciated.  Capillary refill is brisk.  JVP not visible.  No LE edema.  Radial and DP pulses 2+ and symmetric. Respiratory: Increased respiratory rate.  Shallow breaths, no wheezes heard. Abdomen: Abdomen with guarding. No ascites, distension.   MSK: No deformities or effusions.  No cyanosis or clubbing.  Diffuse loss of muscel mass. Neuro: Groans/grimaces to noxious stimuli.  Does not open eyes.  Does not follow commands. Psych: Unable to assess.     Labs on Admission:  I have personally reviewed following labs and imaging studies: CBC:  Recent Labs Lab 05/10/16 0043  WBC 13.3*  NEUTROABS 12.0*  HGB 11.2*  HCT 36.0*  MCV 102.3*  PLT 201   Basic Metabolic Panel:  Recent Labs Lab 05/10/16 0043  NA 143  K 3.2*  CL 106  CO2 30  GLUCOSE 122*  BUN 26*  CREATININE 1.31*  CALCIUM 8.6*   GFR: CrCl cannot be calculated (Unknown ideal weight.).  Liver Function Tests:  Recent Labs Lab 05/10/16 0043  AST 28  ALT 16*  ALKPHOS 63  BILITOT 1.1  PROT 6.8  ALBUMIN 3.6   No results for input(s): LIPASE, AMYLASE in the last 168 hours. No results for input(s): AMMONIA in the last 168 hours. Coagulation Profile:  Recent Labs Lab 05/10/16 0043  INR 1.04   Cardiac Enzymes: No results for input(s): CKTOTAL, CKMB, CKMBINDEX, TROPONINI in the last 168 hours. BNP (last 3 results) No results for input(s): PROBNP in the last 8760 hours. HbA1C: No results for input(s): HGBA1C in the last 72 hours. CBG: No results for input(s): GLUCAP in the last 168 hours. Lipid Profile: No results for input(s): CHOL, HDL, LDLCALC, TRIG, CHOLHDL, LDLDIRECT in the last 72 hours. Thyroid Function Tests: No results for input(s): TSH, T4TOTAL, FREET4, T3FREE, THYROIDAB in the last 72 hours. Anemia Panel: No results for input(s): VITAMINB12, FOLATE, FERRITIN, TIBC, IRON, RETICCTPCT in the last 72 hours. Sepsis  Labs: Lactic acid 1.26 Invalid input(s): PROCALCITONIN, LACTICIDVEN No results found for this or any previous visit (from the past 240 hour(s)).       Radiological Exams on Admission: Personally reviewed CXR shows no pneumonia: Dg Chest 2 View  Result Date: 05/10/2016 CLINICAL DATA:  Fever and unresponsiveness. Possible urinary tract infection. EXAM: CHEST  2 VIEW COMPARISON:  03/29/2015 FINDINGS: Shallow inspiration. Normal heart size and pulmonary vascularity. No focal airspace disease or consolidation in the lungs. No blunting of costophrenic angles. No pneumothorax. Mediastinal contours appear intact. Degenerative changes in the spine. IMPRESSION: Shallow inspiration.  No evidence of active pulmonary disease. Electronically Signed   By: Burman NievesWilliam  Stevens M.D.   On: 05/10/2016 01:31  EKG: Independently reviewed. Rate 94, Qtc 472, sinus tachycardia, no ST changes.    Assessment/Plan Principal Problem:   Fever Active Problems:   Dementia   Macrocytic anemia   Protein-calorie malnutrition, severe   Altered mental status  1. UTI, early sepsis:  Suspected source UTI. Organism unknown. Patient meets SIRS criteria given tachycardia, tachypnea, fever, leukocytosis, but without evidence of organ dysfunction.  Lactate 1.26 mmol/L  -Sepsis bundle utilized:  -Blood and urine cultures drawn  -Start targeted antibiotics with ceftriaxone, based on suspected source of infection    -Repeat renal function and complete blood count in AM  -Code SEPSIS called to E-link  -Check RVP and flu   2. Hypokalemia: -Check mag -Supplement orally   3. Macrocytic anemia:  Stable  4. Dementia with behavioral disturbance:  -Continue escitalopram, quetiapine, trazodone, tramadol  5. HTN:  -Hold HCTZ until hemodynamics clear  6. Severe PCM:  -Boost between meals         DVT prophylaxis: Lovenox  Code Status: FULL  Family Communication: None present  Disposition Plan: Anticipate IV  fluids and antibiotics and provide supportive cares. Consults called: None Admission status: INPATIENT, med surg        Medical decision making: Patient seen at 3:00 AM on 05/10/2016.  The patient was discussed with Dr. Preston Fleeting.  What exists of the patient's chart was reviewed in depth and summarized above.  Clinical condition: requiring ongoing fluids and IV antibiotics for urinary sepsis.        Alberteen Sam [facility] Pager [phone]      At the time of admission, it appears that the appropriate admission status for this patient is INPATIENT. This is judged to be reasonable and necessary in order to provide the required intensity of service to ensure the patient's safety given the presenting symptoms, physical exam findings, and initial radiographic and laboratory data in the context of their chronic comorbidities.  Together, these circumstances are felt to place her/him at high risk for further clinical deterioration threatening life, limb, or organ. The following factors support the admission status of inpatient:   A. The patient's presenting symptoms include decerased responsiveness and fever B. The worrisome physical exam findings include fever, tachycardia, tachypnea C. The initial radiographic and laboratory data are worrisome because of elevated creatinine, hypokalemia, leukocytosis, anemia, pyuria D. The chronic co-morbidities include advanced dementia E. Patient requires inpatient status due to high intensity of service, high risk for further deterioration and high frequency of surveillance required because of this acute illness that poses a threat to life or bodily function. F. I certify that at the point of admission it is my clinical judgment that the patient will require inpatient hospital care spanning beyond 2 midnights from the point of admission and that early discharge would result in unnecessary risk of decompensation and readmission or threat to  life, limb or bodily function.

## 2016-05-10 NOTE — ED Notes (Signed)
Bed: [id number] Expected date:  Expected time:  Means of arrival:  Comments: 82 yo M/ Fever

## 2016-05-10 NOTE — ED Notes (Signed)
Pt responsive to painful stimuli only EDP [place] to bedside V/S stable and are as noted

## 2016-05-10 NOTE — Progress Notes (Signed)
PROGRESS NOTE                                                                                                                                                                                                             Patient Demographics:    Wayne Schroeder, is a 82 y.o. male, DOB - 04-04-1925, ZOX:[id number]  Admit date - 05/10/2016   Admitting Physician Alberteen Samhristopher P Danford, MD  Outpatient Primary MD for the patient is Martha ClanShaw, William, MD  LOS - 0  Outpatient Specialists:None  Chief Complaint  Patient presents with  . Fever  . unresponsive       Brief Narrative   82 year old male with advanced dementia presented from memory care unit with one-day history of fever. Patient was found to have change in mental status with increased aggression. He was found to have fewer 101.48 Fahrenheit. In the ED he had fever of 100.2 the Fahrenheit heart rate of 100 and normal blood pressure. Blood work showed WBC of 13.3 K potassium of 3.2 and mild acute kidney injury. UA was positive for UTI.    Subjective:    Patient sleepy but arousable. Very confused   Assessment  & Plan :    Principal Problem:   Sepsis, unspecified organism (HCC) Resolved. Likely secondary to UTI. Continue Rocephin, IV fluids. Supportive care with Tylenol.  Active Problems:   Unspecified dementia with behavioral disturbance Underlying severe dementia with agitation secondary to UTI and sepsis. Monitor. Hold off Seroquel, tramadol, Lexapro and trazodone until mentation improved.  Acute kidney injury Secondary to dehydration and UTI.  Monitor with hydration.    Protein-calorie malnutrition, severe Nutrition consult.     Pressure injury of skin Wound care consult appreciated. Dressing per recommendation.      Code Status : Full code   Family Communication  : None at bedside  Disposition Plan  : Return to memory care once  improved  Barriers For Discharge : Active symptoms  Consults  : None  Procedures  : None  DVT Prophylaxis  :  Lovenox  Lab Results  Component Value Date   PLT 187 05/10/2016    Antibiotics  :    Anti-infectives    Start     Dose/Rate Route Frequency Ordered Stop   05/10/16 0430  cefTRIAXone (ROCEPHIN) 1 g in dextrose 5 % 50 mL IVPB  1 g 100 mL/hr over 30 Minutes Intravenous Daily 05/10/16 0429     05/10/16 0215  piperacillin-tazobactam (ZOSYN) IVPB 3.375 g     3.375 g 100 mL/hr over 30 Minutes Intravenous  Once 05/10/16 0213 05/10/16 0350   05/10/16 0215  vancomycin (VANCOCIN) IVPB 1000 mg/200 mL premix     1,000 mg 200 mL/hr over 60 Minutes Intravenous  Once 05/10/16 0213 05/10/16 0350        Objective:   Vitals:   05/10/16 0300 05/10/16 0330 05/10/16 0414 05/10/16 0418  BP: 113/58 149/78  120/83  Pulse: 84 95  (!) 107  Resp: 15 16  17   Temp:    99.5 F (37.5 C)  TempSrc:    Oral  SpO2: 95% 97%  92%  Weight:   49.9 kg (110 lb 0.2 oz)   Height:   5\' 10"  (1.778 m)     Wt Readings from Last 3 Encounters:  05/10/16 49.9 kg (110 lb 0.2 oz)  07/04/15 52.7 kg (116 lb 3.2 oz)  04/04/15 57.5 kg (126 lb 11.2 oz)     Intake/Output Summary (Last 24 hours) at 05/10/16 1424 Last data filed at 05/10/16 0600  Gross per 24 hour  Intake          2822.92 ml  Output                0 ml  Net          2822.92 ml     Physical Exam  Gen: Somnolent, arousable HEENT:moist mucosa, supple neck, temporal wasting Chest: clear b/l, no added sounds CVS: N S1&S2, no murmurs, rubs or gallop GI: soft, NT, ND, BS+ Musculoskeletal: warm, no edema CNS: AAOX0    Data Review:    CBC  Recent Labs Lab 05/10/16 0043 05/10/16 0553  WBC 13.3* 13.1*  HGB 11.2* 10.2*  HCT 36.0* 32.6*  PLT 201 187  MCV 102.3* 100.3*  MCH 31.8 31.4  MCHC 31.1 31.3  RDW 14.6 14.1  LYMPHSABS 0.7  --   MONOABS 0.6  --   EOSABS 0.0  --   BASOSABS 0.0  --     Chemistries   Recent  Labs Lab 05/10/16 0043 05/10/16 0553  NA 143 144  K 3.2* 3.4*  CL 106 107  CO2 30 29  GLUCOSE 122* 129*  BUN 26* 25*  CREATININE 1.31* 1.36*  CALCIUM 8.6* 8.0*  MG  --  1.8  AST 28  --   ALT 16*  --   ALKPHOS 63  --   BILITOT 1.1  --    ------------------------------------------------------------------------------------------------------------------ No results for input(s): CHOL, HDL, LDLCALC, TRIG, CHOLHDL, LDLDIRECT in the last 72 hours.  No results found for: HGBA1C ------------------------------------------------------------------------------------------------------------------ No results for input(s): TSH, T4TOTAL, T3FREE, THYROIDAB in the last 72 hours.  Invalid input(s): FREET3 ------------------------------------------------------------------------------------------------------------------ No results for input(s): VITAMINB12, FOLATE, FERRITIN, TIBC, IRON, RETICCTPCT in the last 72 hours.  Coagulation profile  Recent Labs Lab 05/10/16 0043  INR 1.04    No results for input(s): DDIMER in the last 72 hours.  Cardiac Enzymes No results for input(s): CKMB, TROPONINI, MYOGLOBIN in the last 168 hours.  Invalid input(s): CK ------------------------------------------------------------------------------------------------------------------ No results found for: BNP  Inpatient Medications  Scheduled Meds: . cefTRIAXone (ROCEPHIN)  IV  1 g Intravenous Q0600  . Chlorhexidine Gluconate Cloth  6 each Topical Q0600  . enoxaparin (LOVENOX) injection  40 mg Subcutaneous Q24H  . escitalopram  10 mg Oral Daily  . feeding supplement (ENSURE  ENLIVE)  237 mL Oral BID BM  . mupirocin ointment  1 application Nasal BID  . polyvinyl alcohol  2 drop Both Eyes BID  . QUEtiapine  12.5 mg Oral Daily  . traMADol  50 mg Oral BID  . traZODone  50 mg Oral QHS   Continuous Infusions: . 0.9 % NaCl with KCl 40 mEq / L 100 mL/hr (05/10/16 1211)   PRN Meds:.acetaminophen **OR**  acetaminophen, ondansetron **OR** ondansetron (ZOFRAN) IV  Micro Results Recent Results (from the past 240 hour(s))  MRSA PCR Screening     Status: Abnormal   Collection Time: 05/10/16  4:37 AM  Result Value Ref Range Status   MRSA by PCR POSITIVE (A) NEGATIVE Final    Comment:        The GeneXpert MRSA Assay (FDA approved for NASAL specimens only), is one component of a comprehensive MRSA colonization surveillance program. It is not intended to diagnose MRSA infection nor to guide or monitor treatment for MRSA infections. RESULT CALLED TO, READ BACK BY AND VERIFIED WITH: T RYAN AT 7829 ON 11.25.2017 BY NBROOKS     Radiology Reports Dg Chest 2 View  Result Date: 05/10/2016 CLINICAL DATA:  Fever and unresponsiveness. Possible urinary tract infection. EXAM: CHEST  2 VIEW COMPARISON:  03/29/2015 FINDINGS: Shallow inspiration. Normal heart size and pulmonary vascularity. No focal airspace disease or consolidation in the lungs. No blunting of costophrenic angles. No pneumothorax. Mediastinal contours appear intact. Degenerative changes in the spine. IMPRESSION: Shallow inspiration.  No evidence of active pulmonary disease. Electronically Signed   By: Burman Nieves M.D.   On: 05/10/2016 01:31    Time Spent in minutes  20   Eddie North M.D on 05/10/2016 at 2:24 PM  Between 7am to 7pm - Pager - [phone]  After 7pm go to [email or web address] - password [facility]  [facility] -  Office  [phone]

## 2016-05-10 NOTE — ED Provider Notes (Signed)
[facility]-EMERGENCY DEPT Provider Note   CSN: [id number] Arrival date & time: 05/10/16  0020  By signing my name below, I, Bridgette HabermannMaria Tan, attest that this documentation has been prepared under the direction and in the presence of Dione Boozeavid Coleta Grosshans, MD. Electronically Signed: Bridgette HabermannMaria Tan, ED Scribe. 05/10/16. 12:54 AM.  History   Chief Complaint Chief Complaint  Patient presents with  . Fever  . unresponsive   LEVEL 5 CAVEAT: HPI and ROS limited due to pt unresponsive.  HPI Comments: Kellie SimmeringJohn R Depaolo is a 82 y.o. male with h/o dementia who presents to the Emergency Department [facility] complaining of fever (Tmax 101.4) onset around 8 pm tonight. Per EMS, pt was given his regular Trazodone, Ativan, and Tramadol around the same time and facility staff attempted to wake him up around 12 am today with no success. He was not given any OTC medication for his symptoms. Pt appears to be in no acute distress.  The history is provided by the patient and the EMS personnel. No language interpreter was used.    Past Medical History:  Diagnosis Date  . Dementia     Patient Active Problem List   Diagnosis Date Noted  . Palliative care encounter   . Hypokalemia 03/31/2015  . Acute renal failure (ARF) (HCC)   . Urinary tract infectious disease   . Protein-calorie malnutrition, severe 03/30/2015  . Sepsis (HCC) 03/29/2015  . Acute hypernatremia 03/29/2015  . Elevated troponin 03/28/2015  . UTI (urinary tract infection) 03/28/2015  . Leukocytosis 03/25/2015  . ARF (acute renal failure) (HCC) 03/25/2015  . Macrocytic anemia 03/24/2015  . Dementia 03/22/2015    Past Surgical History:  Procedure Laterality Date  . BACK SURGERY    . TOTAL HIP ARTHROPLASTY Right        Home Medications    Prior to Admission medications   Medication Sig Start Date End Date Taking? Authorizing Provider  antiseptic oral rinse (CPC / CETYLPYRIDINIUM CHLORIDE 0.05%) 0.05 % LIQD solution 7 mLs by Mouth  Rinse route 2 times daily at 12 noon and 4 pm. 04/01/15   Alison MurrayAlma M Devine, MD  chlorhexidine (PERIDEX) 0.12 % solution 15 mLs by Mouth Rinse route 2 (two) times daily. 04/01/15   Alison MurrayAlma M Devine, MD  Eyelid Cleansers (OCUSOFT EYELID CLEANSING) PADS Apply topically 2 (two) times daily.    Historical Provider, MD  LORazepam (ATIVAN) 0.5 MG tablet 1/2 tablet by mouth three times daily as needed for anxiety    Historical Provider, MD  ondansetron (ZOFRAN) 4 MG tablet Take 1 tablet (4 mg total) by mouth every 6 (six) hours as needed for nausea. 04/01/15   Alison MurrayAlma M Devine, MD  pantoprazole (PROTONIX) 40 MG tablet Take 1 tablet (40 mg total) by mouth daily. 04/01/15   Alison MurrayAlma M Devine, MD  Propylene Glycol (SYSTANE BALANCE) 0.6 % SOLN Apply 2 drops to eye 2 (two) times daily.    Historical Provider, MD  Protein (PROCEL 100) POWD Take 2 scoop by mouth 2 (two) times daily.    Historical Provider, MD  UNABLE TO FIND Med Name: Med Pass 120 mL four times daily for nutritional support    Historical Provider, MD    Family History Family History  Problem Relation Age of Onset  . Dementia Other     Social History Social History  Substance Use Topics  . Smoking status: Never Smoker  . Smokeless tobacco: Not on file  . Alcohol use No     Allergies  Patient has no known allergies.   Review of Systems Review of Systems  Unable to perform ROS: Patient unresponsive     Physical Exam Updated Vital Signs BP 118/61 (BP Location: Left Arm)   Pulse 100   Temp 100.2 F (37.9 C) (Rectal)   Resp 16   SpO2 99% Comment: on Non rebreather  Physical Exam  Constitutional: He is oriented to person, place, and time. He appears cachectic.  HENT:  Head: Normocephalic and atraumatic.  Mouth/Throat: Mucous membranes are dry.  Eyes: EOM are normal. Pupils are equal, round, and reactive to light.  Lids everted.  Neck: Normal range of motion. Neck supple. No JVD present.  Cardiovascular: Normal rate, regular rhythm  and normal heart sounds.   No murmur heard. Pulmonary/Chest: Effort normal and breath sounds normal. He has no wheezes. He has no rales. He exhibits no tenderness.  Abdominal: Soft. Bowel sounds are normal. He exhibits no distension and no mass. There is no tenderness.  Musculoskeletal: Normal range of motion. He exhibits no edema.  Lymphadenopathy:    He has no cervical adenopathy.  Neurological: He is alert and oriented to person, place, and time. No cranial nerve deficit. He exhibits normal muscle tone. Coordination normal.  Responds to painful stimuli but not verbal stimuli. No focal motor deficits.  Skin: Skin is warm and dry. No rash noted.  Psychiatric: He has a normal mood and affect. His behavior is normal. Judgment and thought content normal.  Nursing note and vitals reviewed.    ED Treatments / Results  DIAGNOSTIC STUDIES: Oxygen Saturation is 99% on RA, normal by my interpretation.    COORDINATION OF CARE: 12:49 AM Discussed treatment plan with pt at bedside and pt agreed to plan.  Labs (all labs ordered are listed, but only abnormal results are displayed) Labs Reviewed  COMPREHENSIVE METABOLIC PANEL - Abnormal; Notable for the following:       Result Value   Potassium 3.2 (*)    Glucose, Bld 122 (*)    BUN 26 (*)    Creatinine, Ser 1.31 (*)    Calcium 8.6 (*)    ALT 16 (*)    GFR calc non Af Amer 47 (*)    GFR calc Af Amer 54 (*)    All other components within normal limits  CBC WITH DIFFERENTIAL/PLATELET - Abnormal; Notable for the following:    WBC 13.3 (*)    RBC 3.52 (*)    Hemoglobin 11.2 (*)    HCT 36.0 (*)    MCV 102.3 (*)    Neutro Abs 12.0 (*)    All other components within normal limits  URINALYSIS, ROUTINE W REFLEX MICROSCOPIC (NOT AT [facility]) - Abnormal; Notable for the following:    Color, Urine AMBER (*)    APPearance CLOUDY (*)    Hgb urine dipstick MODERATE (*)    Protein, ur 30 (*)    Nitrite POSITIVE (*)    All other components within  normal limits  URINE MICROSCOPIC-ADD ON - Abnormal; Notable for the following:    Squamous Epithelial / LPF 0-5 (*)    Bacteria, UA MANY (*)    All other components within normal limits  CULTURE, BLOOD (ROUTINE X 2)  CULTURE, BLOOD (ROUTINE X 2)  URINE CULTURE  RESPIRATORY PANEL BY PCR  PROTIME-INR  INFLUENZA PANEL BY PCR (TYPE A & B, H1N1)  I-STAT CG4 LACTIC ACID, ED    EKG  EKG Interpretation  Date/Time:  Saturday May 10 2016 00:30:29 EST  Ventricular Rate:  94 PR Interval:    QRS Duration: 90 QT Interval:  377 QTC Calculation: 472 R Axis:   75 Text Interpretation:  Sinus rhythm Probable anteroseptal infarct, old When compared with ECG of 03/29/2015, No significant change was found Confirmed by [facility]  MD, Gaige Sebo ([id number]) on 05/10/2016 12:57:22 AM       Radiology Dg Chest 2 View  Result Date: 05/10/2016 CLINICAL DATA:  Fever and unresponsiveness. Possible urinary tract infection. EXAM: CHEST  2 VIEW COMPARISON:  03/29/2015 FINDINGS: Shallow inspiration. Normal heart size and pulmonary vascularity. No focal airspace disease or consolidation in the lungs. No blunting of costophrenic angles. No pneumothorax. Mediastinal contours appear intact. Degenerative changes in the spine. IMPRESSION: Shallow inspiration.  No evidence of active pulmonary disease. Electronically Signed   By: Burman NievesWilliam  Stevens M.D.   On: 05/10/2016 01:31    Procedures Procedures (including critical care time)  Medications Ordered in ED Medications  piperacillin-tazobactam (ZOSYN) IVPB 3.375 g (3.375 g Intravenous New Bag/Given 05/10/16 0231)  vancomycin (VANCOCIN) IVPB 1000 mg/200 mL premix (1,000 mg Intravenous New Bag/Given 05/10/16 0240)  sodium chloride 0.9 % bolus 1,000 mL (1,000 mLs Intravenous New Bag/Given 05/10/16 0230)     Initial Impression / Assessment and Plan / ED Course  I have reviewed the triage vital signs and the nursing notes.  Pertinent labs & imaging results that were  available during my care of the patient were reviewed by me and considered in my medical decision making (see chart for details).  Clinical Course    Fever with altered mental status-possible sepsis. Hemodynamics are normal. He started on ED sepsis pathway, but fluid resuscitation is withheld pending lactic acid level. Lactic acid has come back normal. Chest x-ray shows no evidence of pneumonia. Laboratory workup shows mild elevation creatinine consistent with dehydration. Chronic microcytic anemia is present unchanged from baseline. Urinalysis is still pending at this point. Case is discussed with Dr. Maryfrances Bunnellanford of [facility] who agrees to admit the patient. He does request influenza and respiratory virus panels be obtained.  Urinalysis is come back with positive nitrite and many bacteria so this is the presumed source. He is already on appropriate antibiotics.  Final Clinical Impressions(s) / ED Diagnoses   Final diagnoses:  Fever, unspecified fever cause  Altered mental status, unspecified altered mental status type  Macrocytic anemia  Acute kidney injury (nontraumatic) (HCC)  Hypokalemia  Urinary tract infection without hematuria, site unspecified    New Prescriptions New Prescriptions   No medications on file   I personally performed the services described in this documentation, which was scribed in my presence. The recorded information has been reviewed and is accurate.       Dione Boozeavid Vail Vuncannon, MD 05/10/16 [phone]

## 2016-05-10 NOTE — ED Triage Notes (Addendum)
Per  EMS, pt[facility] Is [facility] with complaint of fever at 101.4 and unresponsiveness. Pt. Last seen well at 8pm last night with Temp. 101.4 'F . Pt. Received his trazodone and Ativan and Tramadol at 8pm last night. Pt. Responded to pain stimuli only. Caregiver reported that pt. May have UTI.

## 2016-05-11 DIAGNOSIS — E870 Hyperosmolality and hypernatremia: Secondary | ICD-10-CM

## 2016-05-11 LAB — CBC
HCT: 32 % — ABNORMAL LOW (ref 39.0–52.0)
HEMOGLOBIN: 10.1 g/dL — AB (ref 13.0–17.0)
MCH: 31.7 pg (ref 26.0–34.0)
MCHC: 31.6 g/dL (ref 30.0–36.0)
MCV: 100.3 fL — AB (ref 78.0–100.0)
Platelets: 179 10*3/uL (ref 150–400)
RBC: 3.19 MIL/uL — AB (ref 4.22–5.81)
RDW: 14.2 % (ref 11.5–15.5)
WBC: 10.4 10*3/uL (ref 4.0–10.5)

## 2016-05-11 LAB — BASIC METABOLIC PANEL
Anion gap: 7 (ref 5–15)
BUN: 20 mg/dL (ref 6–20)
CHLORIDE: 116 mmol/L — AB (ref 101–111)
CO2: 24 mmol/L (ref 22–32)
CREATININE: 0.98 mg/dL (ref 0.61–1.24)
Calcium: 8.4 mg/dL — ABNORMAL LOW (ref 8.9–10.3)
GFR calc Af Amer: >60 mL/min (ref >60)
GFR calc non Af Amer: >60 mL/min (ref >60)
GLUCOSE: 92 mg/dL (ref 65–99)
POTASSIUM: 3.9 mmol/L (ref 3.5–5.1)
Sodium: 147 mmol/L — ABNORMAL HIGH (ref 135–145)

## 2016-05-11 LAB — RESPIRATORY PANEL BY PCR
Adenovirus: NOT DETECTED
BORDETELLA PERTUSSIS-RVPCR: NOT DETECTED
CORONAVIRUS 229E-RVPPCR: NOT DETECTED
Chlamydophila pneumoniae: NOT DETECTED
Coronavirus HKU1: NOT DETECTED
Coronavirus NL63: NOT DETECTED
Coronavirus OC43: NOT DETECTED
INFLUENZA A-RVPPCR: NOT DETECTED
INFLUENZA B-RVPPCR: NOT DETECTED
METAPNEUMOVIRUS-RVPPCR: DETECTED — AB
Mycoplasma pneumoniae: NOT DETECTED
PARAINFLUENZA VIRUS 2-RVPPCR: NOT DETECTED
PARAINFLUENZA VIRUS 4-RVPPCR: NOT DETECTED
Parainfluenza Virus 1: NOT DETECTED
Parainfluenza Virus 3: NOT DETECTED
RESPIRATORY SYNCYTIAL VIRUS-RVPPCR: NOT DETECTED
RHINOVIRUS / ENTEROVIRUS - RVPPCR: NOT DETECTED

## 2016-05-11 MED ORDER — FREE WATER
200.0000 mL | Freq: Four times a day (QID) | Status: DC
  Administered 2016-05-11: 200 mL via ORAL

## 2016-05-11 MED ORDER — DEXTROSE 5 % IV SOLN
INTRAVENOUS | Status: AC
  Administered 2016-05-11 (×2): via INTRAVENOUS

## 2016-05-11 NOTE — Progress Notes (Addendum)
PROGRESS NOTE                                                                                                                                                                                                             Patient Demographics:    Wayne Schroeder, is a 82 y.o. male, DOB - March 30, 1925, RUE:[id number]  Admit date - 05/10/2016   Admitting Physician Alberteen Sam, MD  Outpatient Primary MD for the patient is Martha Clan, MD  LOS - 1  Outpatient Specialists:None  Chief Complaint  Patient presents with  . Fever  . unresponsive       Brief Narrative   82 year old male with advanced dementia presented from memory care unit with one-day history of fever. Patient was found to have change in mental status with increased aggression. He was found to have fewer 101.48 Fahrenheit. In the ED he had fever of 100.2 the Fahrenheit heart rate of 100 and normal blood pressure. Blood work showed WBC of 13.3 K potassium of 3.2 and mild acute kidney injury. UA was positive for UTI.    Subjective:   Patient still sleepy but arousable and mumbling incoherently. Was able to eat some Food last night.   Assessment  & Plan :    Principal Problem:   Sepsis, unspecified organism (HCC) Resolved. Likely secondary to UTI. Continue Rocephin, IV fluids.Urine Cultures growing GNR, pending sensitivities.. Supportive care with Tylenol.  Active Problems:  advanced alzheimer's dementia with behavioral disturbance Underlying severe dementia with agitation secondary to UTI and sepsis.  Hold off Seroquel, tramadol, Lexapro and trazodone until mentation improved. Obtain swallow eval. Discussed with wife and daughter at bedside. At baseline , has severe dementia, poorly communicative and smiles when seeing them. Wife feels he is about 50% his baseline mental status today.   Acute kidney injury Secondary to dehydration and UTI.    Improved with hydration.  Hypernatremia Associated with dehydration. Switch fluids to D5 W and free water.    Protein-calorie malnutrition, severe Nutrition consult.     Pressure injury of skin Wound care consult appreciated. Dressing per recommendation.      Code Status : DNR, as per wife who is his HCPOA Family Communication  :  wife and daughter at bedside  Disposition Plan  : Return to memory care once improved  Barriers For Discharge :  Active symptoms  Consults  : None  Procedures  : None  DVT Prophylaxis  :  Lovenox  Lab Results  Component Value Date   PLT 179 05/11/2016    Antibiotics  :    Anti-infectives    Start     Dose/Rate Route Frequency Ordered Stop   05/10/16 0430  cefTRIAXone (ROCEPHIN) 1 g in dextrose 5 % 50 mL IVPB     1 g 100 mL/hr over 30 Minutes Intravenous Daily 05/10/16 0429     05/10/16 0215  piperacillin-tazobactam (ZOSYN) IVPB 3.375 g     3.375 g 100 mL/hr over 30 Minutes Intravenous  Once 05/10/16 0213 05/10/16 0350   05/10/16 0215  vancomycin (VANCOCIN) IVPB 1000 mg/200 mL premix     1,000 mg 200 mL/hr over 60 Minutes Intravenous  Once 05/10/16 0213 05/10/16 0350        Objective:   Vitals:   05/10/16 0418 05/10/16 1500 05/10/16 2232 05/11/16 0623  BP: 120/83 130/60 121/74 120/62  Pulse: (!) 107 94 83 70  Resp: 17 18 18 17   Temp: 99.5 F (37.5 C) 98.4 F (36.9 C) 99.2 F (37.3 C) 98.2 F (36.8 C)  TempSrc: Oral Oral Oral Axillary  SpO2: 92% 96% 95% 98%  Weight:      Height:        Wt Readings from Last 3 Encounters:  05/10/16 49.9 kg (110 lb 0.2 oz)  07/04/15 52.7 kg (116 lb 3.2 oz)  04/04/15 57.5 kg (126 lb 11.2 oz)     Intake/Output Summary (Last 24 hours) at 05/11/16 1256 Last data filed at 05/11/16 0700  Gross per 24 hour  Intake          2686.67 ml  Output              450 ml  Net          2236.67 ml     Physical Exam  Gen: Somnolent, arousable HEENT:moist mucosa, supple neck, temporal  wasting Chest: clear b/l, no added sounds CVS: N S1&S2, no murmurs,  GI: soft, NT, ND Musculoskeletal: warm, no edema CNS: AAOX0    Data Review:    CBC  Recent Labs Lab 05/10/16 0043 05/10/16 0553 05/11/16 0612  WBC 13.3* 13.1* 10.4  HGB 11.2* 10.2* 10.1*  HCT 36.0* 32.6* 32.0*  PLT 201 187 179  MCV 102.3* 100.3* 100.3*  MCH 31.8 31.4 31.7  MCHC 31.1 31.3 31.6  RDW 14.6 14.1 14.2  LYMPHSABS 0.7  --   --   MONOABS 0.6  --   --   EOSABS 0.0  --   --   BASOSABS 0.0  --   --     Chemistries   Recent Labs Lab 05/10/16 0043 05/10/16 0553 05/11/16 0612  NA 143 144 147*  K 3.2* 3.4* 3.9  CL 106 107 116*  CO2 30 29 24   GLUCOSE 122* 129* 92  BUN 26* 25* 20  CREATININE 1.31* 1.36* 0.98  CALCIUM 8.6* 8.0* 8.4*  MG  --  1.8  --   AST 28  --   --   ALT 16*  --   --   ALKPHOS 63  --   --   BILITOT 1.1  --   --    ------------------------------------------------------------------------------------------------------------------ No results for input(s): CHOL, HDL, LDLCALC, TRIG, CHOLHDL, LDLDIRECT in the last 72 hours.  No results found for: HGBA1C ------------------------------------------------------------------------------------------------------------------ No results for input(s): TSH, T4TOTAL, T3FREE, THYROIDAB in the last 72 hours.  Invalid  input(s): FREET3 ------------------------------------------------------------------------------------------------------------------ No results for input(s): VITAMINB12, FOLATE, FERRITIN, TIBC, IRON, RETICCTPCT in the last 72 hours.  Coagulation profile  Recent Labs Lab 05/10/16 0043  INR 1.04    No results for input(s): DDIMER in the last 72 hours.  Cardiac Enzymes No results for input(s): CKMB, TROPONINI, MYOGLOBIN in the last 168 hours.  Invalid input(s): CK ------------------------------------------------------------------------------------------------------------------ No results found for: BNP  Inpatient  Medications  Scheduled Meds: . cefTRIAXone (ROCEPHIN)  IV  1 g Intravenous Q0600  . Chlorhexidine Gluconate Cloth  6 each Topical Q0600  . enoxaparin (LOVENOX) injection  30 mg Subcutaneous Q24H  . feeding supplement (ENSURE ENLIVE)  237 mL Oral BID BM  . free water  200 mL Oral Q6H  . mupirocin ointment  1 application Nasal BID  . polyvinyl alcohol  2 drop Both Eyes BID   Continuous Infusions: . dextrose 75 mL/hr at 05/11/16 0800   PRN Meds:.acetaminophen **OR** acetaminophen, ondansetron **OR** ondansetron (ZOFRAN) IV  Micro Results Recent Results (from the past 240 hour(s))  Culture, blood (Routine x 2)     Status: None (Preliminary result)   Collection Time: 05/10/16 12:43 AM  Result Value Ref Range Status   Specimen Description BLOOD RIGHT ARM  Final   Special Requests BOTTLES DRAWN AEROBIC AND ANAEROBIC 5ML  Final   Culture   Final    NO GROWTH 1 DAY Performed at [facility]    Report Status PENDING  Incomplete  Culture, blood (Routine x 2)     Status: None (Preliminary result)   Collection Time: 05/10/16 12:50 AM  Result Value Ref Range Status   Specimen Description BLOOD RIGHT FOREARM  Final   Special Requests BOTTLES DRAWN AEROBIC ONLY 8ML  Final   Culture   Final    NO GROWTH 1 DAY Performed at [facility]    Report Status PENDING  Incomplete  Urine culture     Status: Abnormal (Preliminary result)   Collection Time: 05/10/16  1:47 AM  Result Value Ref Range Status   Specimen Description URINE, CATHETERIZED  Final   Special Requests NONE  Final   Culture >=100,000 COLONIES/mL GRAM NEGATIVE RODS (A)  Final   Report Status PENDING  Incomplete  Respiratory Panel by PCR     Status: Abnormal   Collection Time: 05/10/16  3:39 AM  Result Value Ref Range Status   Adenovirus NOT DETECTED NOT DETECTED Final   Coronavirus 229E NOT DETECTED NOT DETECTED Final   Coronavirus HKU1 NOT DETECTED NOT DETECTED Final   Coronavirus NL63 NOT DETECTED NOT  DETECTED Final   Coronavirus OC43 NOT DETECTED NOT DETECTED Final   Metapneumovirus DETECTED (A) NOT DETECTED Final   Rhinovirus / Enterovirus NOT DETECTED NOT DETECTED Final   Influenza A NOT DETECTED NOT DETECTED Final   Influenza B NOT DETECTED NOT DETECTED Final   Parainfluenza Virus 1 NOT DETECTED NOT DETECTED Final   Parainfluenza Virus 2 NOT DETECTED NOT DETECTED Final   Parainfluenza Virus 3 NOT DETECTED NOT DETECTED Final   Parainfluenza Virus 4 NOT DETECTED NOT DETECTED Final   Respiratory Syncytial Virus NOT DETECTED NOT DETECTED Final   Bordetella pertussis NOT DETECTED NOT DETECTED Final   Chlamydophila pneumoniae NOT DETECTED NOT DETECTED Final   Mycoplasma pneumoniae NOT DETECTED NOT DETECTED Final    Comment: Performed at [facility]  MRSA PCR Screening     Status: Abnormal   Collection Time: 05/10/16  4:37 AM  Result Value Ref Range Status  MRSA by PCR POSITIVE (A) NEGATIVE Final    Comment:        The GeneXpert MRSA Assay (FDA approved for NASAL specimens only), is one component of a comprehensive MRSA colonization surveillance program. It is not intended to diagnose MRSA infection nor to guide or monitor treatment for MRSA infections. RESULT CALLED TO, READ BACK BY AND VERIFIED WITH: T RYAN AT 1610 ON 11.25.2017 BY NBROOKS     Radiology Reports Dg Chest 2 View  Result Date: 05/10/2016 CLINICAL DATA:  Fever and unresponsiveness. Possible urinary tract infection. EXAM: CHEST  2 VIEW COMPARISON:  03/29/2015 FINDINGS: Shallow inspiration. Normal heart size and pulmonary vascularity. No focal airspace disease or consolidation in the lungs. No blunting of costophrenic angles. No pneumothorax. Mediastinal contours appear intact. Degenerative changes in the spine. IMPRESSION: Shallow inspiration.  No evidence of active pulmonary disease. Electronically Signed   By: Burman Nieves M.D.   On: 05/10/2016 01:31    Time Spent in minutes  25   Eddie North M.D on 05/11/2016 at 12:56 PM  Between 7am to 7pm - Pager - [phone]  After 7pm go to [email or web address] - password [facility]  [facility] -  Office  [phone]

## 2016-05-12 LAB — BASIC METABOLIC PANEL
ANION GAP: 4 — AB (ref 5–15)
BUN: 14 mg/dL (ref 6–20)
CO2: 28 mmol/L (ref 22–32)
Calcium: 8.5 mg/dL — ABNORMAL LOW (ref 8.9–10.3)
Chloride: 111 mmol/L (ref 101–111)
Creatinine, Ser: 0.88 mg/dL (ref 0.61–1.24)
GLUCOSE: 108 mg/dL — AB (ref 65–99)
POTASSIUM: 3.5 mmol/L (ref 3.5–5.1)
Sodium: 143 mmol/L (ref 135–145)

## 2016-05-12 LAB — URINE CULTURE

## 2016-05-12 MED ORDER — DEXTROSE 5 % IV SOLN
INTRAVENOUS | Status: DC
  Administered 2016-05-12 – 2016-05-14 (×2): via INTRAVENOUS

## 2016-05-12 NOTE — Progress Notes (Signed)
Initial Nutrition Assessment  DOCUMENTATION CODES:   Severe malnutrition in context of chronic illness, Underweight  INTERVENTION:   -RD to monitor for results of SLP evaluation -Continue Ensure Supplements BID once diet can be resumed. -Recommend Beneprotein powder, 2 scoops BID. Each scoop provides 25 kcal and 6g protein.  NUTRITION DIAGNOSIS:   Malnutrition related to chronic illness as evidenced by percent weight loss, severe depletion of body fat, severe depletion of muscle mass.  GOAL:   Patient will meet greater than or equal to 90% of their needs  MONITOR:   PO intake, Supplement acceptance, Labs, Weight trends, Skin, I & O's  REASON FOR ASSESSMENT:   Low Braden, Other (Comment) (Low BMI)    ASSESSMENT:   82 year old male with advanced dementia presented from memory care unit with one-day history of fever. Patient was found to have change in mental status with increased aggression.  Patient scheduled for speech evaluation, results pending. Per chart, pt has had problems swallowing and spitting out food.   Patient in room with no family at bedside. Per tech outside of room, family has not been present today. Pt unable to provide history d/t dementia. Per chart review, pt is provided ProCel powder with meals at facility, 2 scoops BID (provides 104 kcal, 20g protein). Beneprotein is a similar supplement and if diet is resumed, will order for patient in addition to Ensure supplements already ordered.  Pt has lost 10 lb since 10/13 (8% wt loss x 1.5 months, significant for time frame). Nutrition-Focused physical exam completed, pt with chest and arms exposed.  Findings are severe fat depletion, severe muscle depletion, and no edema.   Labs reviewed. Medications reviewed.  Diet Order:  Diet full liquid Room service appropriate? Yes; Fluid consistency: Thin  Skin:  Wound (see comment) (Stage I pressure injury)  Last BM:  11/26  Height:   Ht Readings from Last 1  Encounters:  05/10/16 5\' 10"  (1.778 m)    Weight:   Wt Readings from Last 1 Encounters:  05/10/16 110 lb 0.2 oz (49.9 kg)    Ideal Body Weight:  75.5 kg  BMI:  Body mass index is 15.78 kg/m.  Estimated Nutritional Needs:   Kcal:  1400-1600  Protein:  65-75g  Fluid:  1.4-1.6L/day  EDUCATION NEEDS:   No education needs identified at this time  Tilda FrancoLindsey Shalandria Elsbernd, MS, RD, LDN Pager: [phone] After Hours Pager: [phone]

## 2016-05-12 NOTE — Evaluation (Signed)
Physical Therapy Evaluation-1x Patient Details Name: Wayne SimmeringJohn R Schroeder MRN: [id number] DOB: 12-Sep-1924 Today's Date: 05/12/2016   History of Present Illness  82 yo male admitted with sepsis. Hx of severe dementia, R hemiarthroplasty 2016. Pt is from an ALF  Clinical Impression  1x eval. Pt was total assist for bed mobility. Pt is unable to participate meaningfully with therapy. He does not follow commands. Pt is not appropriate for PT services. Will sign off.     Follow Up Recommendations No PT follow up    Equipment Recommendations  None recommended by PT    Recommendations for Other Services       Precautions / Restrictions Precautions Precautions: Fall Restrictions Weight Bearing Restrictions: No      Mobility  Bed Mobility Overal bed mobility: Needs Assistance Bed Mobility: Supine to Sit;Sit to Supine     Supine to sit: Total assist;HOB elevated Sit to supine: Total assist;HOB elevated   General bed mobility comments: Assisted pt to partial sitting pt at EOB for a brief period of ~1-2 minutes. Pt did not follow commands or participate meaningfully with session.   Transfers                    Ambulation/Gait                Stairs            Wheelchair Mobility    Modified Rankin (Stroke Patients Only)       Balance Overall balance assessment: Needs assistance   Sitting balance-Leahy Scale: Zero                                       Pertinent Vitals/Pain Faces Pain Scale: No hurt    Home Living Family/patient expects to be discharged to:: Assisted living                      Prior Function           Comments: no family available to provide PLOF. Per chart, pt was wheelchair bound at ALF     Hand Dominance        Extremity/Trunk Assessment   Upper Extremity Assessment: Difficult to assess due to impaired cognition           Lower Extremity Assessment: Difficult to assess due to impaired  cognition      Cervical / Trunk Assessment: Kyphotic  Communication   Communication: Receptive difficulties;Expressive difficulties  Cognition Arousal/Alertness: Awake/alert Behavior During Therapy: Restless Overall Cognitive Status: History of cognitive impairments - at baseline                      General Comments      Exercises     Assessment/Plan    PT Assessment Patent does not need any further PT services  PT Problem List            PT Treatment Interventions      PT Goals (Current goals can be found in the Care Plan section)       Frequency     Barriers to discharge        Co-evaluation               End of Session   Activity Tolerance: Patient tolerated treatment well Patient left: in bed;with call bell/phone within reach;with bed alarm set  Time: [id number] PT Time Calculation (min) (ACUTE ONLY): 11 min   Charges:   PT Evaluation $PT Eval Low Complexity: 1 Procedure     PT G Codes:        Rebeca AlertJannie Ambyr Qadri, MPT Pager: [phone]

## 2016-05-12 NOTE — Progress Notes (Signed)
PROGRESS NOTE                                                                                                                                                                                                             Patient Demographics:    Wayne Schroeder, is a 83 y.o. male, DOB - September 23, 1924, ZOX:[id number]  Admit date - 05/10/2016   Admitting Physician Alberteen Sam, MD  Outpatient Primary MD for the patient is Martha Clan, MD  LOS - 2  Outpatient Specialists:None  Chief Complaint  Patient presents with  . Fever  . unresponsive       Brief Narrative   82 year old male with advanced dementia presented from memory care unit with one-day history of fever. Patient was found to have change in mental status with increased aggression.    Subjective:   He continues to be confused, mumbling , continues to refuse to eat.    Assessment  & Plan :    Principal Problem:   Sepsis, unspecified organism (HCC) Resolved. Likely secondary to  [facility] UTI, on rocephin.  Supportive care with Tylenol.  Active Problems:  advanced alzheimer's dementia with behavioral disturbance Underlying severe dementia with agitation secondary to UTI and sepsis.  Hold off Seroquel, tramadol, Lexapro and trazodone until mentation improved. Pending.  swallow eval.  Dr Gonzella Lex Discussed with wife and daughter at bedside. At baseline , has severe dementia, poorly communicative and smiles when seeing them.    Acute kidney injury Secondary to dehydration and UTI.  Improved with hydration. Resolved.   Hypernatremia Associated with dehydration. Switch fluids to D5 W and free water. Resolved.     Protein-calorie malnutrition, severe Nutrition consult.     Pressure injury of skin Wound care consult appreciated. Dressing per recommendation.      Code Status : DNR, as per wife who is his HCPOA Family Communication  :  None at  bedside.   Disposition Plan  : Return to memory care once improved  Barriers For Discharge : Active symptoms  Consults  : None  Procedures  : None  DVT Prophylaxis  :  Lovenox  Lab Results  Component Value Date   PLT 179 05/11/2016    Antibiotics  :    Anti-infectives    Start     Dose/Rate Route Frequency Ordered Stop   05/10/16  0430  cefTRIAXone (ROCEPHIN) 1 g in dextrose 5 % 50 mL IVPB     1 g 100 mL/hr over 30 Minutes Intravenous Daily 05/10/16 0429     05/10/16 0215  piperacillin-tazobactam (ZOSYN) IVPB 3.375 g     3.375 g 100 mL/hr over 30 Minutes Intravenous  Once 05/10/16 0213 05/10/16 0350   05/10/16 0215  vancomycin (VANCOCIN) IVPB 1000 mg/200 mL premix     1,000 mg 200 mL/hr over 60 Minutes Intravenous  Once 05/10/16 0213 05/10/16 0350        Objective:   Vitals:   05/11/16 1500 05/11/16 2004 05/12/16 0436 05/12/16 0500  BP: 117/66 132/62 (!) 125/56 132/64  Pulse: 84 80 74   Resp: 18 18 17    Temp: 97.3 F (36.3 C) 99.5 F (37.5 C) 98.5 F (36.9 C)   TempSrc: Oral Oral Axillary   SpO2: 100% 96% 94%   Weight:      Height:        Wt Readings from Last 3 Encounters:  05/10/16 49.9 kg (110 lb 0.2 oz)  07/04/15 52.7 kg (116 lb 3.2 oz)  04/04/15 57.5 kg (126 lb 11.2 oz)     Intake/Output Summary (Last 24 hours) at 05/12/16 1441 Last data filed at 05/12/16 0547  Gross per 24 hour  Intake          1898.75 ml  Output             1000 ml  Net           898.75 ml     Physical Exam  Gen: Alert comfortable.  HEENT:moist mucosa, supple neck, temporal wasting Chest: clear b/l, no added sounds CVS: N S1&S2, no murmurs,  GI: soft, NT, ND Musculoskeletal: warm, no edema CNS: AAOX0    Data Review:    CBC  Recent Labs Lab 05/10/16 0043 05/10/16 0553 05/11/16 0612  WBC 13.3* 13.1* 10.4  HGB 11.2* 10.2* 10.1*  HCT 36.0* 32.6* 32.0*  PLT 201 187 179  MCV 102.3* 100.3* 100.3*  MCH 31.8 31.4 31.7  MCHC 31.1 31.3 31.6  RDW 14.6 14.1 14.2    LYMPHSABS 0.7  --   --   MONOABS 0.6  --   --   EOSABS 0.0  --   --   BASOSABS 0.0  --   --     Chemistries   Recent Labs Lab 05/10/16 0043 05/10/16 0553 05/11/16 0612 05/12/16 0527  NA 143 144 147* 143  K 3.2* 3.4* 3.9 3.5  CL 106 107 116* 111  CO2 30 29 24 28   GLUCOSE 122* 129* 92 108*  BUN 26* 25* 20 14  CREATININE 1.31* 1.36* 0.98 0.88  CALCIUM 8.6* 8.0* 8.4* 8.5*  MG  --  1.8  --   --   AST 28  --   --   --   ALT 16*  --   --   --   ALKPHOS 63  --   --   --   BILITOT 1.1  --   --   --    ------------------------------------------------------------------------------------------------------------------ No results for input(s): CHOL, HDL, LDLCALC, TRIG, CHOLHDL, LDLDIRECT in the last 72 hours.  No results found for: HGBA1C ------------------------------------------------------------------------------------------------------------------ No results for input(s): TSH, T4TOTAL, T3FREE, THYROIDAB in the last 72 hours.  Invalid input(s): FREET3 ------------------------------------------------------------------------------------------------------------------ No results for input(s): VITAMINB12, FOLATE, FERRITIN, TIBC, IRON, RETICCTPCT in the last 72 hours.  Coagulation profile  Recent Labs Lab 05/10/16 0043  INR 1.04  No results for input(s): DDIMER in the last 72 hours.  Cardiac Enzymes No results for input(s): CKMB, TROPONINI, MYOGLOBIN in the last 168 hours.  Invalid input(s): CK ------------------------------------------------------------------------------------------------------------------ No results found for: BNP  Inpatient Medications  Scheduled Meds: . cefTRIAXone (ROCEPHIN)  IV  1 g Intravenous Q0600  . Chlorhexidine Gluconate Cloth  6 each Topical Q0600  . enoxaparin (LOVENOX) injection  30 mg Subcutaneous Q24H  . feeding supplement (ENSURE ENLIVE)  237 mL Oral BID BM  . free water  200 mL Oral Q6H  . mupirocin ointment  1 application Nasal  BID  . polyvinyl alcohol  2 drop Both Eyes BID   Continuous Infusions:  PRN Meds:.acetaminophen **OR** acetaminophen, ondansetron **OR** ondansetron (ZOFRAN) IV  Micro Results Recent Results (from the past 240 hour(s))  Culture, blood (Routine x 2)     Status: None (Preliminary result)   Collection Time: 05/10/16 12:43 AM  Result Value Ref Range Status   Specimen Description BLOOD RIGHT ARM  Final   Special Requests BOTTLES DRAWN AEROBIC AND ANAEROBIC  Final   Culture   Final    NO GROWTH 2 DAYS Performed at [facility]    Report Status PENDING  Incomplete  Culture, blood (Routine x 2)     Status: None (Preliminary result)   Collection Time: 05/10/16 12:50 AM  Result Value Ref Range Status   Specimen Description BLOOD RIGHT FOREARM  Final   Special Requests BOTTLES DRAWN AEROBIC ONLY  Final   Culture   Final    NO GROWTH 2 DAYS Performed at [facility]    Report Status PENDING  Incomplete  Urine culture     Status: Abnormal   Collection Time: 05/10/16  1:47 AM  Result Value Ref Range Status   Specimen Description URINE, CATHETERIZED  Final   Special Requests NONE  Final   Culture >=100,000 COLONIES/mL ESCHERICHIA COLI (A)  Final   Report Status 05/12/2016 FINAL  Final   Organism ID, Bacteria ESCHERICHIA COLI (A)  Final      Susceptibility   Escherichia coli - MIC*    AMPICILLIN <=2 SENSITIVE Sensitive     CEFAZOLIN <=4 SENSITIVE Sensitive     CEFTRIAXONE <=1 SENSITIVE Sensitive     CIPROFLOXACIN <=0.25 SENSITIVE Sensitive     GENTAMICIN <=1 SENSITIVE Sensitive     IMIPENEM <=0.25 SENSITIVE Sensitive     NITROFURANTOIN <=16 SENSITIVE Sensitive     TRIMETH/SULFA <=20 SENSITIVE Sensitive     AMPICILLIN/SULBACTAM <=2 SENSITIVE Sensitive     PIP/TAZO <=4 SENSITIVE Sensitive     Extended ESBL NEGATIVE Sensitive     * >=100,000 COLONIES/mL ESCHERICHIA COLI  Respiratory Panel by PCR     Status: Abnormal   Collection Time: 05/10/16  3:39 AM  Result  Value Ref Range Status   Adenovirus NOT DETECTED NOT DETECTED Final   Coronavirus 229E NOT DETECTED NOT DETECTED Final   Coronavirus HKU1 NOT DETECTED NOT DETECTED Final   Coronavirus NL63 NOT DETECTED NOT DETECTED Final   Coronavirus OC43 NOT DETECTED NOT DETECTED Final   Metapneumovirus DETECTED (A) NOT DETECTED Final   Rhinovirus / Enterovirus NOT DETECTED NOT DETECTED Final   Influenza A NOT DETECTED NOT DETECTED Final   Influenza B NOT DETECTED NOT DETECTED Final   Parainfluenza Virus 1 NOT DETECTED NOT DETECTED Final   Parainfluenza Virus 2 NOT DETECTED NOT DETECTED Final   Parainfluenza Virus 3 NOT DETECTED NOT DETECTED Final   Parainfluenza Virus 4 NOT DETECTED  NOT DETECTED Final   Respiratory Syncytial Virus NOT DETECTED NOT DETECTED Final   Bordetella pertussis NOT DETECTED NOT DETECTED Final   Chlamydophila pneumoniae NOT DETECTED NOT DETECTED Final   Mycoplasma pneumoniae NOT DETECTED NOT DETECTED Final    Comment: [facility]  MRSA PCR Screening     Status: Abnormal   Collection Time: 05/10/16  4:37 AM  Result Value Ref Range Status   MRSA by PCR POSITIVE (A) NEGATIVE Final    Comment:        The GeneXpert MRSA Assay (FDA approved for NASAL specimens only), is one component of a comprehensive MRSA colonization surveillance program. It is not intended to diagnose MRSA infection nor to guide or monitor treatment for MRSA infections. RESULT CALLED TO, READ BACK BY AND VERIFIED WITH: T RYAN AT [id number] ON 11.25.2017 BY NBROOKS     Radiology Reports Dg Chest 2 View  Result Date: 05/10/2016 CLINICAL DATA:  Fever and unresponsiveness. Possible urinary tract infection. EXAM: CHEST  2 VIEW COMPARISON:  03/29/2015 FINDINGS: Shallow inspiration. Normal heart size and pulmonary vascularity. No focal airspace disease or consolidation in the lungs. No blunting of costophrenic angles. No pneumothorax. Mediastinal contours appear intact. Degenerative changes in  the spine. IMPRESSION: Shallow inspiration.  No evidence of active pulmonary disease. Electronically Signed   By: Burman NievesWilliam  Stevens M.D.   On: 05/10/2016 01:31    Time Spent in minutes  25 minutes   Porshia Blizzard M.D on 05/12/2016 at 2:41 PM  Between 7am to 7pm - Pager - [phone]  After 7pm go to [email or web address] - password [email or web address]  [facility] -  Office  [phone]

## 2016-05-12 NOTE — Evaluation (Signed)
Clinical/Bedside Swallow Evaluation Patient Details  Name: Wayne SimmeringJohn R Schroeder MRN: [id number] Date of Birth: 1924/06/29  Today's Date: 05/12/2016 Time: SLP Start Time (ACUTE ONLY): 1135 SLP Stop Time (ACUTE ONLY): 1220 SLP Time Calculation (min) (ACUTE ONLY): 45 min  Past Medical History:  Past Medical History:  Diagnosis Date  . Dementia    Past Surgical History:  Past Surgical History:  Procedure Laterality Date  . BACK SURGERY    . TOTAL HIP ARTHROPLASTY Right    HPI:  82 [facility].  Pt PMH + for severe dementia, malnutrition.  Pt CXR negative for acute findings.  Swallow evaluation ordered.  Pt has been seen in the past and was placed on dys1/nectar diet in Oct 2016.    Assessment / Plan / Recommendation Clinical Impression  Pt presents with severe dysarthria - and cognitive based dysphagia.  Oral transiting was excessively delayed - with pt only opening mouth minimally.   Delayed swallow initiation observed with no overt indication of aspiration.      Aspiration Risk  Moderate aspiration risk    Diet Recommendation Thin liquid;Nectar-thick liquid (full liquids)   Liquid Administration via: Cup;No straw;Spoon Medication Administration: Crushed with puree Supervision: Patient able to self feed Compensations: Slow rate;Small sips/bites;Other (Comment) (oral suction - ) Postural Changes: Seated upright at 90 degrees;Remain upright for at least 30 minutes after po intake    Other  Recommendations Oral Care Recommendations: Oral care before and after PO Other Recommendations: Have oral suction available;Clarify dietary restrictions   Follow up Recommendations Skilled Nursing facility      Frequency and Duration min 1 x/week  1 week       Prognosis Prognosis for Safe Diet Advancement: Fair Barriers to Reach Goals: Severity of deficits      Swallow Study   General Date of Onset: 05/12/16 HPI: 82 [facility].  Pt PMH +  for severe dementia, malnutrition.  Pt CXR negative for acute findings.  Swallow evaluation ordered.  Pt has been seen in the past and was placed on dys1/nectar diet in Oct 2016.  Type of Study: Bedside Swallow Evaluation Diet Prior to this Study: Thin liquids Temperature Spikes Noted: Yes (low grade) Respiratory Status: Room air History of Recent Intubation: No Behavior/Cognition: Alert;Cooperative;Pleasant mood Oral Care Completed by SLP: Yes Oral Cavity - Dentition: Adequate natural dentition Vision: Impaired for self-feeding Self-Feeding Abilities: Total assist Patient Positioning: Upright in bed Volitional Cough: Cognitively unable to elicit Volitional Swallow: Unable to elicit    Oral/Motor/Sensory Function Overall Oral Motor/Sensory Function: Other (comment) (pt did not follow directions - )   Ice Chips Ice chips: Not tested   Thin Liquid Thin Liquid: Impaired Presentation: Cup;Straw;Spoon Oral Phase Impairments: Reduced lingual movement/coordination;Reduced labial seal Oral Phase Functional Implications: Prolonged oral transit;Oral holding Pharyngeal  Phase Impairments: Suspected delayed Swallow    Nectar Thick Nectar Thick Liquid: Impaired Presentation: Spoon;Straw;Cup Oral Phase Impairments: Reduced labial seal;Reduced lingual movement/coordination;Poor awareness of bolus Oral phase functional implications: Prolonged oral transit;Oral holding Pharyngeal Phase Impairments: Suspected delayed Swallow   Honey Thick Honey Thick Liquid: Not tested   Puree Puree: Impaired Presentation: Self Fed;Spoon Oral Phase Impairments: Reduced labial seal;Reduced lingual movement/coordination Oral Phase Functional Implications: Prolonged oral transit;Oral holding Pharyngeal Phase Impairments: Suspected delayed Swallow   Solid   GO   Solid: Impaired Oral Phase Impairments: Reduced labial seal;Impaired mastication Oral Phase Functional Implications: Impaired mastication;Prolonged oral  transit Pharyngeal Phase Impairments: Suspected delayed  [address]        Wayne BailKimball, Wayne Schroeder Wayne Whitni Pasquini HarringtonKimball, [place] [facility] [place]LP [phone]

## 2016-05-13 ENCOUNTER — Inpatient Hospital Stay (HOSPITAL_COMMUNITY): Payer: Medicare Other

## 2016-05-13 MED ORDER — ENSURE ENLIVE PO LIQD
237.0000 mL | Freq: Three times a day (TID) | ORAL | Status: DC
  Administered 2016-05-15 – 2016-05-16 (×2): 237 mL via ORAL

## 2016-05-13 MED ORDER — AMOXICILLIN 500 MG PO CAPS
500.0000 mg | ORAL_CAPSULE | Freq: Two times a day (BID) | ORAL | Status: DC
  Administered 2016-05-14: 500 mg via ORAL
  Filled 2016-05-13 (×5): qty 1

## 2016-05-13 MED ORDER — BENEPROTEIN PO POWD
2.0000 | Freq: Three times a day (TID) | ORAL | Status: DC
  Filled 2016-05-13: qty 227

## 2016-05-13 MED ORDER — BENEPROTEIN PO POWD: 2.0000 | Freq: Three times a day (TID) | ORAL | Status: DC

## 2016-05-13 MED ORDER — RESOURCE INSTANT PROTEIN PO PWD PACKET
2.0000 | Freq: Three times a day (TID) | ORAL | Status: DC
  Administered 2016-05-14: 12 g via ORAL
  Administered 2016-05-15: 6 g via ORAL
  Administered 2016-05-15 – 2016-05-16 (×3): 12 g via ORAL
  Filled 2016-05-13 (×11): qty 12

## 2016-05-13 NOTE — Progress Notes (Signed)
Nutrition Follow-up  DOCUMENTATION CODES:   Severe malnutrition in context of chronic illness, Underweight  INTERVENTION:  Provide Beneprotein powder 2 scoops TID with meals. Mix the 2 scoops into applesauce or pudding. Each scoop provides 25 kcal and 6 grams protein.  Increase to Ensure Enlive po TID, each supplement provides 350 kcal and 20 grams of protein.  Will monitor for any goals of care discussions with family and adjust nutrition intervention as needed.  NUTRITION DIAGNOSIS:   Malnutrition related to chronic illness as evidenced by percent weight loss, severe depletion of body fat, severe depletion of muscle mass.  Ongoing.  GOAL:   Patient will meet greater than or equal to 90% of their needs  Not met.  MONITOR:   PO intake, Supplement acceptance, Labs, Weight trends, Skin, I & O's  REASON FOR ASSESSMENT:   Low Braden, Other (Comment) (Low BMI)    ASSESSMENT:   82 year old male with advanced dementia presented from memory care unit with one-day history of fever. Patient was found to have change in mental status with increased aggression.  -Noted in chart that patient is DNR. However, no other conversations regarding goals of care have been charted. -Patient had SLP evaluation yesterday. Found to have severe dysarthria and cognitive-based dysphagia. Recommended full liquid diet.  Attempted to speak with patient at bedside. Patient is very confused and would only mumble in response to questions. When asked if he wanted his Ensure, he did say "yes" but then also said "no." No family at bedside. Discussed with RN who reports patient has had some intake of trays brought to room and has also had a little bit of Ensure.  Meal Completion: 0-20% per chart. In the past 24 hours patient has had at most 195 kcal (14% minimum estimated kcal needs) and 8 grams protein (12% minimum estimated protein needs).  Medications reviewed and include: D5 @ 50 ml/hr (60 grams dextrose,  204 kcal daily).  Labs reviewed: Glucose 108, Anion gap 4.  Diet Order:  Diet full liquid Room service appropriate? Yes; Fluid consistency: Thin  Skin:  Wound (see comment) (Stage I pressure injury)  Last BM:  05/12/2016  Height:   Ht Readings from Last 1 Encounters:  05/10/16 _0  (1.778 m)    Weight:   Wt Readings from Last 1 Encounters:  05/10/16 110 lb 0.2 oz (49.9 kg)    Ideal Body Weight:  75.5 kg  BMI:  Body mass index is 15.78 kg/m.  Estimated Nutritional Needs:   Kcal:  1400-1600  Protein:  65-75g  Fluid:  1.4-1.6L/day  EDUCATION NEEDS:   No education needs identified at this time  Willey Blade, MS, RD, LDN Pager: [phone] After Hours Pager: [phone]

## 2016-05-13 NOTE — Progress Notes (Addendum)
PHARMACY NOTE   Pharmacy has been assisting with dosing of Ceftriaxone for UTI. Dosage remains stable at 1g IV q24h, and medication does not require renal/hepatic adjustment.     Will sign off at this time.  Noted E.coli in urine is pan-sensitive. Please transition to oral antibiotic therapy once patient clinically improved and able to safely take PO medications.   Please reconsult if a change in clinical status warrants re-evaluation.    Greer PickerelJigna Peighton Edgin, PharmD, BCPS Pager: [phone] 05/13/2016 10:19 AM

## 2016-05-13 NOTE — Progress Notes (Addendum)
PROGRESS NOTE                                                                                                                                                                                                             Patient Demographics:    Wayne Schroeder, is a 82 y.o. male, DOB - March 29, 1925, ONG:[id number]  Admit date - 05/10/2016   Admitting Physician Alberteen Samhristopher P Danford, MD  Outpatient Primary MD for the patient is Martha ClanShaw, William, MD  LOS - 3  Outpatient Specialists:None  Chief Complaint  Patient presents with  . Fever  . unresponsive       Brief Narrative   [age] year old male with advanced dementia presented from memory care unit with one-day history of fever. Patient was found to have change in mental status with increased aggression.    Subjective:   He continues to be confused, mumbling , but says he is hungry today.    Assessment  & Plan :    Principal Problem:   Sepsis, unspecified organism (HCC) Resolved. Likely secondary to  [facility] UTI, on rocephin.  Supportive care with Tylenol.  Active Problems:  advanced alzheimer's dementia with behavioral disturbance Underlying severe dementia with agitation secondary to UTI and sepsis.  Hold off Seroquel, tramadol, Lexapro and trazodone until mentation improved. Swallow eval recommended dysarthria and dysphagia, recommending full liquid diet.   Dr Gonzella Lexhungel Discussed with wife and daughter at bedside. At baseline , has severe dementia, poorly communicative and smiles when seeing them.  Will get CT head to evaluate for new stroke.   Metabolic encephalopathy: improving.   Acute kidney injury Secondary to dehydration and UTI.  Improved with hydration. Resolved.   Hypernatremia Associated with dehydration. Switch fluids to D5 W and free water. Resolved.  Discontinue the dextrose fluids today.     Protein-calorie malnutrition, severe Nutrition  consult.   Pressure ulcer : Stage I - Intact skin with non-blanchable redness of a localized area usually over a bony prominence.     Code Status : DNR, as per wife who is his HCPOA Family Communication  :  None at bedside.   Disposition Plan  : Return to memory care once improved  Barriers For Discharge : Active symptoms  Consults  : None  Procedures  : None  DVT Prophylaxis  :  Lovenox  Lab Results  Component Value Date   PLT 179 05/11/2016    Antibiotics  :    Anti-infectives    Start     Dose/Rate Route Frequency Ordered Stop   05/13/16 1300  amoxicillin (AMOXIL) capsule 500 mg     500 mg Oral Every 12 hours 05/13/16 1203     05/10/16 0430  cefTRIAXone (ROCEPHIN) 1 g in dextrose 5 % 50 mL IVPB  Status:  Discontinued     1 g 100 mL/hr over 30 Minutes Intravenous Daily 05/10/16 0429 05/13/16 1203   05/10/16 0215  piperacillin-tazobactam (ZOSYN) IVPB 3.375 g     3.375 g 100 mL/hr over 30 Minutes Intravenous  Once 05/10/16 0213 05/10/16 0350   05/10/16 0215  vancomycin (VANCOCIN) IVPB 1000 mg/200 mL premix     1,000 mg 200 mL/hr over 60 Minutes Intravenous  Once 05/10/16 0213 05/10/16 0350        Objective:   Vitals:   05/12/16 0500 05/12/16 1500 05/12/16 2240 05/13/16 0529  BP: 132/64 (!) 152/56 (!) 134/96 132/69  Pulse:  87 81 78  Resp:  17 17 17   Temp:  98.8 F (37.1 C) 98.2 F (36.8 C) 98.7 F (37.1 C)  TempSrc:  Axillary Oral Oral  SpO2:  97% 98% 96%  Weight:      Height:        Wt Readings from Last 3 Encounters:  05/10/16 49.9 kg (110 lb 0.2 oz)  07/04/15 52.7 kg (116 lb 3.2 oz)  04/04/15 57.5 kg (126 lb 11.2 oz)     Intake/Output Summary (Last 24 hours) at 05/13/16 1332 Last data filed at 05/13/16 0600  Gross per 24 hour  Intake           658.33 ml  Output              550 ml  Net           108.33 ml     Physical Exam  Gen: Alert comfortable.  HEENT:moist mucosa, supple neck, temporal wasting Chest: clear b/l, no added  sounds CVS: N S1&S2, no murmurs,  GI: soft, NT, ND Musculoskeletal: warm, no edema CNS: AAOX0    Data Review:    CBC  Recent Labs Lab 05/10/16 0043 05/10/16 0553 05/11/16 0612  WBC 13.3* 13.1* 10.4  HGB 11.2* 10.2* 10.1*  HCT 36.0* 32.6* 32.0*  PLT 201 187 179  MCV 102.3* 100.3* 100.3*  MCH 31.8 31.4 31.7  MCHC 31.1 31.3 31.6  RDW 14.6 14.1 14.2  LYMPHSABS 0.7  --   --   MONOABS 0.6  --   --   EOSABS 0.0  --   --   BASOSABS 0.0  --   --     Chemistries   Recent Labs Lab 05/10/16 0043 05/10/16 0553 05/11/16 0612 05/12/16 0527  NA 143 144 147* 143  K 3.2* 3.4* 3.9 3.5  CL 106 107 116* 111  CO2 30 29 24 28   GLUCOSE 122* 129* 92 108*  BUN 26* 25* 20 14  CREATININE 1.31* 1.36* 0.98 0.88  CALCIUM 8.6* 8.0* 8.4* 8.5*  MG  --  1.8  --   --   AST 28  --   --   --   ALT 16*  --   --   --   ALKPHOS 63  --   --   --   BILITOT 1.1  --   --   --    ------------------------------------------------------------------------------------------------------------------ No results for input(s): CHOL, HDL,  LDLCALC, TRIG, CHOLHDL, LDLDIRECT in the last 72 hours.  No results found for: HGBA1C ------------------------------------------------------------------------------------------------------------------ No results for input(s): TSH, T4TOTAL, T3FREE, THYROIDAB in the last 72 hours.  Invalid input(s): FREET3 ------------------------------------------------------------------------------------------------------------------ No results for input(s): VITAMINB12, FOLATE, FERRITIN, TIBC, IRON, RETICCTPCT in the last 72 hours.  Coagulation profile  Recent Labs Lab 05/10/16 0043  INR 1.04    No results for input(s): DDIMER in the last 72 hours.  Cardiac Enzymes No results for input(s): CKMB, TROPONINI, MYOGLOBIN in the last 168 hours.  Invalid input(s): CK ------------------------------------------------------------------------------------------------------------------ No  results found for: BNP  Inpatient Medications  Scheduled Meds: . amoxicillin  500 mg Oral Q12H  . Chlorhexidine Gluconate Cloth  6 each Topical Q0600  . enoxaparin (LOVENOX) injection  30 mg Subcutaneous Q24H  . feeding supplement (ENSURE ENLIVE)  237 mL Oral TID BM  . free water  200 mL Oral Q6H  . mupirocin ointment  1 application Nasal BID  . polyvinyl alcohol  2 drop Both Eyes BID  . protein supplement  2 scoop Oral TID WC   Continuous Infusions: . dextrose 50 mL/hr at 05/12/16 1750   PRN Meds:.acetaminophen **OR** acetaminophen, ondansetron **OR** ondansetron (ZOFRAN) IV  Micro Results Recent Results (from the past 240 hour(s))  Culture, blood (Routine x 2)     Status: None (Preliminary result)   Collection Time: 05/10/16 12:43 AM  Result Value Ref Range Status   Specimen Description BLOOD RIGHT ARM  Final   Special Requests BOTTLES DRAWN AEROBIC AND ANAEROBIC  Final   Culture   Final    NO GROWTH 3 DAYS Performed at [facility]    Report Status PENDING  Incomplete  Culture, blood (Routine x 2)     Status: None (Preliminary result)   Collection Time: 05/10/16 12:50 AM  Result Value Ref Range Status   Specimen Description BLOOD RIGHT FOREARM  Final   Special Requests BOTTLES DRAWN AEROBIC ONLY  Final   Culture   Final    NO GROWTH 3 DAYS Performed at [facility]    Report Status PENDING  Incomplete  Urine culture     Status: Abnormal   Collection Time: 05/10/16  1:47 AM  Result Value Ref Range Status   Specimen Description URINE, CATHETERIZED  Final   Special Requests NONE  Final   Culture >=100,000 COLONIES/mL ESCHERICHIA COLI (A)  Final   Report Status 05/12/2016 FINAL  Final   Organism ID, Bacteria ESCHERICHIA COLI (A)  Final      Susceptibility   Escherichia coli - MIC*    AMPICILLIN <=2 SENSITIVE Sensitive     CEFAZOLIN <=4 SENSITIVE Sensitive     CEFTRIAXONE <=1 SENSITIVE Sensitive     CIPROFLOXACIN <=0.25 SENSITIVE Sensitive      GENTAMICIN <=1 SENSITIVE Sensitive     IMIPENEM <=0.25 SENSITIVE Sensitive     NITROFURANTOIN <=16 SENSITIVE Sensitive     TRIMETH/SULFA <=20 SENSITIVE Sensitive     AMPICILLIN/SULBACTAM <=2 SENSITIVE Sensitive     PIP/TAZO <=4 SENSITIVE Sensitive     Extended ESBL NEGATIVE Sensitive     * >=100,000 COLONIES/mL ESCHERICHIA COLI  Respiratory Panel by PCR     Status: Abnormal   Collection Time: 05/10/16  3:39 AM  Result Value Ref Range Status   Adenovirus NOT DETECTED NOT DETECTED Final   Coronavirus 229E NOT DETECTED NOT DETECTED Final   Coronavirus HKU1 NOT DETECTED NOT DETECTED Final   Coronavirus NL63 NOT DETECTED NOT DETECTED Final   Coronavirus  OC43 NOT DETECTED NOT DETECTED Final   Metapneumovirus DETECTED (A) NOT DETECTED Final   Rhinovirus / Enterovirus NOT DETECTED NOT DETECTED Final   Influenza A NOT DETECTED NOT DETECTED Final   Influenza B NOT DETECTED NOT DETECTED Final   Parainfluenza Virus 1 NOT DETECTED NOT DETECTED Final   Parainfluenza Virus 2 NOT DETECTED NOT DETECTED Final   Parainfluenza Virus 3 NOT DETECTED NOT DETECTED Final   Parainfluenza Virus 4 NOT DETECTED NOT DETECTED Final   Respiratory Syncytial Virus NOT DETECTED NOT DETECTED Final   Bordetella pertussis NOT DETECTED NOT DETECTED Final   Chlamydophila pneumoniae NOT DETECTED NOT DETECTED Final   Mycoplasma pneumoniae NOT DETECTED NOT DETECTED Final    Comment: [facility]  MRSA PCR Screening     Status: Abnormal   Collection Time: 05/10/16  4:37 AM  Result Value Ref Range Status   MRSA by PCR POSITIVE (A) NEGATIVE Final    Comment:        The GeneXpert MRSA Assay (FDA approved for NASAL specimens only), is one component of a comprehensive MRSA colonization surveillance program. It is not intended to diagnose MRSA infection nor to guide or monitor treatment for MRSA infections. RESULT CALLED TO, READ BACK BY AND VERIFIED WITH: T RYAN AT [id number] ON 11.25.2017 BY NBROOKS      Radiology Reports Dg Chest 2 View  Result Date: 05/10/2016 CLINICAL DATA:  Fever and unresponsiveness. Possible urinary tract infection. EXAM: CHEST  2 VIEW COMPARISON:  03/29/2015 FINDINGS: Shallow inspiration. Normal heart size and pulmonary vascularity. No focal airspace disease or consolidation in the lungs. No blunting of costophrenic angles. No pneumothorax. Mediastinal contours appear intact. Degenerative changes in the spine. IMPRESSION: Shallow inspiration.  No evidence of active pulmonary disease. Electronically Signed   By: Burman NievesWilliam  Stevens M.D.   On: 05/10/2016 01:31    Time Spent in minutes  25 minutes   Frida Wahlstrom M.D on 05/13/2016 at 1:32 PM  Between 7am to 7pm - Pager - [phone]  After 7pm go to [email or web address] - password [email or web address]  [facility] -  Office  [phone]

## 2016-05-13 NOTE — Progress Notes (Addendum)
Speech Language Pathology Treatment: Dysphagia  Patient Details Name: Wayne Schroeder MRN: [id number] DOB: [birth year] Today's Date: 05/13/2016 Time: [id number] SLP Time Calculation (min) (ACUTE ONLY): 15 min  Assessment / Plan / Recommendation Clinical Impression  Pt with very poor intake = presumed due to severe dementia.  Today he is not swallowing his pills or liquids for RN.  He accepted only single bolus of Ensure via bottle with him helping to self feed with maximum encouragement.  Delayed swallow noted but no indication of aspiration.  Pt remains severely dysarthric and aphasic impacting his expressive/receptive communication ability.   Mr [name] did not form suction on straw nor accept tsps or cup boluses of soda or ice - pushing SLP hand away or turning head.  He clearly does not desire po at this time.   When pt verbalized, his voice was strong and clear even after minimal intake - indicative of good airway protection at this time.   For pt, offering intake of liquids may be optimal for his comfort - using oral suction if pt does not swallow.  SLP would recommend palliative consult to help establish goals of care for this pt.  Note CT of head was negative for acute deficit today and CXR upon admit negative.   Will follow up for family education and improvement in swallowing.  Thus far, no family has been present during SLP visits. Thanks.      HPI HPI: 82 yo [facility] with fever, AMS.  Pt PMH + for severe dementia, malnutrition.  Pt CXR negative for acute findings.  Swallow evaluation ordered.  Pt has been seen in the past and was placed on dys1/nectar diet in Oct 2016.       SLP Plan  Other (Comment) (recommend consult to palliative care for establishment of goals given lack of intake, mental status changes)     Recommendations  Diet recommendations: Thin liquid;Nectar-thick liquid (full liquids- oral suctioning if pt not swallowing) Liquids provided via:  Cup;Teaspoon Medication Administration: Via alternative means (pt refusing to swallow medication with RN) Supervision: Staff to assist with self feeding (pt able to hold cup with assistance) Compensations: Minimize environmental distractions;Slow rate;Other (Comment) (oral suction if pt does not swallow) Postural Changes and/or Swallow Maneuvers: Seated upright 90 degrees;Upright 30-60 min after meal                Follow up Recommendations: None Plan: Other (Comment) (recommend consult to palliative care for establishment of goals given lack of intake, mental status changes)       GO                Donavan Burnetamara Shyniece Scripter, MS [facility] SLP [phone]

## 2016-05-14 DIAGNOSIS — N179 Acute kidney failure, unspecified: Secondary | ICD-10-CM

## 2016-05-14 DIAGNOSIS — E876 Hypokalemia: Secondary | ICD-10-CM

## 2016-05-14 DIAGNOSIS — N390 Urinary tract infection, site not specified: Secondary | ICD-10-CM

## 2016-05-14 LAB — BASIC METABOLIC PANEL
Anion gap: 6 (ref 5–15)
BUN: 10 mg/dL (ref 6–20)
CHLORIDE: 105 mmol/L (ref 101–111)
CO2: 27 mmol/L (ref 22–32)
Calcium: 8.4 mg/dL — ABNORMAL LOW (ref 8.9–10.3)
Creatinine, Ser: 0.67 mg/dL (ref 0.61–1.24)
GFR calc Af Amer: >60 mL/min (ref >60)
GFR calc non Af Amer: >60 mL/min (ref >60)
GLUCOSE: 135 mg/dL — AB (ref 65–99)
POTASSIUM: 3 mmol/L — AB (ref 3.5–5.1)
SODIUM: 138 mmol/L (ref 135–145)

## 2016-05-14 LAB — MAGNESIUM: MAGNESIUM: 1.8 mg/dL (ref 1.7–2.4)

## 2016-05-14 MED ORDER — POTASSIUM CHLORIDE CRYS ER 20 MEQ PO TBCR
40.0000 meq | EXTENDED_RELEASE_TABLET | ORAL | Status: DC
Start: 2016-05-14 — End: 2016-05-14
  Filled 2016-05-14: qty 2

## 2016-05-14 MED ORDER — SODIUM CHLORIDE 0.9 % IV SOLN
INTRAVENOUS | Status: DC
  Administered 2016-05-14 – 2016-05-15 (×2): via INTRAVENOUS
  Filled 2016-05-14 (×2): qty 1000

## 2016-05-14 MED ORDER — SODIUM CHLORIDE 0.9 % IV SOLN
30.0000 meq | Freq: Once | INTRAVENOUS | Status: AC
  Administered 2016-05-14: 30 meq via INTRAVENOUS
  Filled 2016-05-14: qty 15

## 2016-05-14 MED ORDER — POTASSIUM CHLORIDE 20 MEQ PO PACK: 40.0000 meq | PACK | ORAL | Status: DC

## 2016-05-14 NOTE — Clinical Social Work Note (Signed)
Clinical Social Work Assessment  Patient Details  Name: Wayne Schroeder MRN: [id number] Date of Birth: [birth year]  Date of referral:  05/14/16               Reason for consult:  Facility Placement                Permission sought to share information with:  Family Supports, Facility Medical sales representativeContact Representative, PCP, Other (Palliative) Permission granted to share information::     Name::     Efrain SellaMarie Branson,   Agency::  [facility]  Relationship::  Daughter(POA)  Contact Information:  [phone], [phone]  Housing/Transportation Living arrangements for the past 2 months:  Assisted Living Facility [facility](Memorycare Unit) Source of Information:  Outpatient Provider [facility] Care Manager Elma) Patient Interpreter Needed:  None Criminal Activity/Legal Involvement Pertinent to Current Situation/Hospitalization:  No - Comment as needed Significant Relationships:  Adult Children, Merchandiser, retailCommunity Support, Spouse Lives with:  Facility Resident Do you feel safe going back to the place where you live?  Yes Need for family participation in patient care:  Yes (Comment)  Care giving concerns:  LCSWA attempted call patient daughter (POA) for collateral information, left voicemail.    Social Worker assessment / plan:  LCSWA spoke with Elma [phone] [facility]. Patient is a resident of their memory care unit.  She reports the patient does not ambulate well at baseline. Facility  tries physical therapy with him, but mostly pt. does not participate. She reports the patient has difficulty taking medication as well. She reports they have to coach him to take them. She reports pt. can feed self independently sometime. She reports the facility can continue to care for patient and can offer palliative services at facility and [facility] if needed.  She plans to visit patient at hospital today.  LCSWA will assit with patient disposition back to ALF-Memory  care.  Employment status:  Retired Health and safety inspectornsurance information:  Medicare PT Recommendations:    Information / Referral to community resources:     Patient/Family's Response to care:  No family at bedside   Patient/Family's Understanding of and Emotional Response to Diagnosis, Current Treatment, and Prognosis:  No family at bedside.   Emotional Assessment Appearance:  Appears stated age Attitude/Demeanor/Rapport:    Affect (typically observed):    Orientation:  Oriented to Self Alcohol / Substance use:  Not Applicable Psych involvement (Current and /or in the community):  No (Comment)  Discharge Needs  Concerns to be addressed:  Discharge Planning Concerns, Care Coordination Readmission within the last 30 days:  No Current discharge risk:  Dependent with Mobility Barriers to Discharge:  Continued Medical Work up   [employer] A Avraj Lindroth, LCSW 05/14/2016, 11:10 AM

## 2016-05-14 NOTE — Care Management Important Message (Signed)
Important Message  Patient Details IM Letter given to Rhonda/Case Manager to present to Patient Name: Wayne SimmeringJohn R Schroeder MRN: [id number] Date of Birth: 1925/03/13   Medicare Important Message Given:  Yes    Haskell FlirtJamison, Roxie Kreeger 05/14/2016, 11:08 AMImportant Message  Patient Details  Name: Wayne Schroeder MRN: [id number] Date of Birth: 1925/03/13   Medicare Important Message Given:  Yes    Haskell FlirtJamison, Marbella Markgraf 05/14/2016, 11:08 AM

## 2016-05-14 NOTE — Progress Notes (Signed)
Palliative Care  I assessed Wayne Schroeder at his bedside this morning. While this is the first time I have met him, he does appear better compared to what has been previously documented. On my arrival he greeted me with "hello" and smiled. He was calm and appeared to be resting comfortably in bed. He mumbled mostly incoherently to my questions, though I was able to make out a few words. He was not able to follow simple commands. There was no agitation or restlessness that had been noted previously. Due to his underlying advanced dementia, he was unable to meaningfully participate in conversation.  I spoke with the primary team provider, Dr. Grandville Silos, who consulted Palliative Care to facilitate a goals of care conversation with Wayne Schroeder family. Wayne Schroeder is severely malnourished, and is now refusing any oral intake, including medication, liquids, and food. There was no family at the bedside. I called his wife's home phone and was unable to reach her (no message machine to leave a message). I then left messages on his daughter's home and cell phone with my contact information. I will plan a family meeting to address goals of care once I hear back from them.  For symptom management, I would recommend utilizing Haldol 0.65m IV q6H PRN for agitation. If he is willing/able to take oral, I would utilize his home medication.   Wayne CourtNP Palliative Care Personal cell [phone]) Team phone [phone]  No charge note.

## 2016-05-14 NOTE — Progress Notes (Signed)
PROGRESS NOTE    Wayne Schroeder  QIO:[id number] DOB: 09-26-1924 DOA: 05/10/2016 PCP: Martha ClanShaw, William, MD    Brief Narrative:  82 year old male with advanced dementia presented from memory care unit with one-day history of fever. Patient was found to have change in mental status with increased aggression.   Assessment & Plan:   Principal Problem:   Sepsis, unspecified organism (HCC) Active Problems:   Unspecified dementia with behavioral disturbance   Macrocytic anemia   Protein-calorie malnutrition, severe   Acute cystitis without hematuria   Essential hypertension   Pressure injury of skin  #1 sepsis likely secondary to Escherichia coli UTI Patient with clinical improvement. Afebrile. Leukocytosis is trended down. Patient was on IV Rocephin and currently now on oral amoxicillin to complete course of antibiotic treatment.  #2 Escherichia coli UTI Patient was on IV Rocephin and now currently on oral amoxicillin.  #3 metabolic encephalopathy Likely secondary to UTI in the setting of advanced Alzheimer's dementia. Improving clinically. Likely close to baseline. Continue empiric antibiotics.  #4 advanced Alzheimer's dementia with behavioral disturbance Patient with history of severe dementia with agitation likely secondary to UTI and sepsis. Agitation improving. Seroquel, tramadol, Lexapro and trazodone have been held. Patient has been seen by speech therapy were recommending full liquid diet. Dr. Gonzella Lexhungel discussed with patient's wife and daughter and it was noted that at baseline patient has severe dementia, poorly communicative. CT head obtained was negative. Palliative care consultation has been obtained for goals of care.  #5 hypernatremia Likely secondary to dehydration. Improved on D5W and free water. Follow.  #6 hypokalemia Patient starting out oral supplementation of potassium. We'll place KCl and IV fluids and give the IV line. Follow.  #7 severe protein calorie  malnutrition Nutritional supplementation.  #8 stage I pressure ulcer Skin intact with normal blanchable redness of a localized area usually over bony prominence. Monitor closely.   DVT prophylaxis: Lovenox Code Status: DO NOT RESUSCITATE Family Communication: No family at bedside. Disposition Plan: Back to assisted living facility at the memory unit with possibly palliative care following.   Consultants:   Palliative care pending: Murrell ConverseSarah Dunn, NP 05/14/2016  Procedures:   CT head without contrast 05/13/2016  Chest x-ray 05/10/2016  Antimicrobials:   IV Rocephin 05/10/2016>>>>> 05/13/2016  Oral amoxicillin 05/13/2016  IV Zosyn 05/10/2016 1 dose  IV vancomycin 05/10/2016 1 dose   Subjective: Patient is pleasantly confused. No chest pain. No shortness of breath. Per nursing patient spitting out medications.  Objective: Vitals:   05/13/16 0529 05/13/16 1439 05/13/16 2129 05/14/16 0437  BP: 132/69 (!) 154/66 132/61 135/78  Pulse: 78 84 83 87  Resp: 17 16 18 18   Temp: 98.7 F (37.1 C) 98.5 F (36.9 C) 98 F (36.7 C) 99.1 F (37.3 C)  TempSrc: Oral Oral Axillary Axillary  SpO2: 96% 91% 92% 94%  Weight:      Height:        Intake/Output Summary (Last 24 hours) at 05/14/16 1306 Last data filed at 05/13/16 2130  Gross per 24 hour  Intake                0 ml  Output              200 ml  Net             -200 ml   Filed Weights   05/10/16 0414  Weight: 49.9 kg (110 lb 0.2 oz)    Examination:  General exam: Pleasantly confused. Respiratory system: Clear to  auscultation anterior lung fields. Respiratory effort normal. Cardiovascular system: S1 & S2 heard, RRR. No JVD, murmurs, rubs, gallops or clicks. No pedal edema. Gastrointestinal system: Abdomen is nondistended, soft and nontender. No organomegaly or masses felt. Normal bowel sounds heard. Central nervous system: Pleasantly confused. Moving all extremities spontaneously.  Extremities: Symmetric 5 x 5  power. Skin: No rashes, lesions or ulcers Psychiatry: Judgement and insight appear poor. Mood & affect fair.     Data Reviewed: I have personally reviewed following labs and imaging studies  CBC:  Recent Labs Lab 05/10/16 0043 05/10/16 0553 05/11/16 0612  WBC 13.3* 13.1* 10.4  NEUTROABS 12.0*  --   --   HGB 11.2* 10.2* 10.1*  HCT 36.0* 32.6* 32.0*  MCV 102.3* 100.3* 100.3*  PLT 201 187 179   Basic Metabolic Panel:  Recent Labs Lab 05/10/16 0043 05/10/16 0553 05/11/16 0612 05/12/16 0527 05/14/16 0940  NA 143 144 147* 143 138  K 3.2* 3.4* 3.9 3.5 3.0*  CL 106 107 116* 111 105  CO2 30 29 24 28 27   GLUCOSE 122* 129* 92 108* 135*  BUN 26* 25* 20 14 10   CREATININE 1.31* 1.36* 0.98 0.88 0.67  CALCIUM 8.6* 8.0* 8.4* 8.5* 8.4*  MG  --  1.8  --   --  1.8   GFR: Estimated Creatinine Clearance: 44.2 mL/min (by C-G formula based on SCr of 0.67 mg/dL). Liver Function Tests:  Recent Labs Lab 05/10/16 0043  AST 28  ALT 16*  ALKPHOS 63  BILITOT 1.1  PROT 6.8  ALBUMIN 3.6   No results for input(s): LIPASE, AMYLASE in the last 168 hours. No results for input(s): AMMONIA in the last 168 hours. Coagulation Profile:  Recent Labs Lab 05/10/16 0043  INR 1.04   Cardiac Enzymes: No results for input(s): CKTOTAL, CKMB, CKMBINDEX, TROPONINI in the last 168 hours. BNP (last 3 results) No results for input(s): PROBNP in the last 8760 hours. HbA1C: No results for input(s): HGBA1C in the last 72 hours. CBG: No results for input(s): GLUCAP in the last 168 hours. Lipid Profile: No results for input(s): CHOL, HDL, LDLCALC, TRIG, CHOLHDL, LDLDIRECT in the last 72 hours. Thyroid Function Tests: No results for input(s): TSH, T4TOTAL, FREET4, T3FREE, THYROIDAB in the last 72 hours. Anemia Panel: No results for input(s): VITAMINB12, FOLATE, FERRITIN, TIBC, IRON, RETICCTPCT in the last 72 hours. Sepsis Labs:  Recent Labs Lab 05/10/16 0100  LATICACIDVEN 1.26    Recent  Results (from the past 240 hour(s))  Culture, blood (Routine x 2)     Status: None (Preliminary result)   Collection Time: 05/10/16 12:43 AM  Result Value Ref Range Status   Specimen Description BLOOD RIGHT ARM  Final   Special Requests BOTTLES DRAWN AEROBIC AND ANAEROBIC  Final   Culture   Final    NO GROWTH 3 DAYS Performed at [facility]    Report Status PENDING  Incomplete  Culture, blood (Routine x 2)     Status: None (Preliminary result)   Collection Time: 05/10/16 12:50 AM  Result Value Ref Range Status   Specimen Description BLOOD RIGHT FOREARM  Final   Special Requests BOTTLES DRAWN AEROBIC ONLY  Final   Culture   Final    NO GROWTH 3 DAYS Performed at [facility]    Report Status PENDING  Incomplete  Urine culture     Status: Abnormal   Collection Time: 05/10/16  1:47 AM  Result Value Ref Range Status   Specimen  Description URINE, CATHETERIZED  Final   Special Requests NONE  Final   Culture >=100,000 COLONIES/mL ESCHERICHIA COLI (A)  Final   Report Status 05/12/2016 FINAL  Final   Organism ID, Bacteria ESCHERICHIA COLI (A)  Final      Susceptibility   Escherichia coli - MIC*    AMPICILLIN <=2 SENSITIVE Sensitive     CEFAZOLIN <=4 SENSITIVE Sensitive     CEFTRIAXONE <=1 SENSITIVE Sensitive     CIPROFLOXACIN <=0.25 SENSITIVE Sensitive     GENTAMICIN <=1 SENSITIVE Sensitive     IMIPENEM <=0.25 SENSITIVE Sensitive     NITROFURANTOIN <=16 SENSITIVE Sensitive     TRIMETH/SULFA <=20 SENSITIVE Sensitive     AMPICILLIN/SULBACTAM <=2 SENSITIVE Sensitive     PIP/TAZO <=4 SENSITIVE Sensitive     Extended ESBL NEGATIVE Sensitive     * >=100,000 COLONIES/mL ESCHERICHIA COLI  Respiratory Panel by PCR     Status: Abnormal   Collection Time: 05/10/16  3:39 AM  Result Value Ref Range Status   Adenovirus NOT DETECTED NOT DETECTED Final   Coronavirus 229E NOT DETECTED NOT DETECTED Final   Coronavirus HKU1 NOT DETECTED NOT DETECTED Final   Coronavirus  NL63 NOT DETECTED NOT DETECTED Final   Coronavirus OC43 NOT DETECTED NOT DETECTED Final   Metapneumovirus DETECTED (A) NOT DETECTED Final   Rhinovirus / Enterovirus NOT DETECTED NOT DETECTED Final   Influenza A NOT DETECTED NOT DETECTED Final   Influenza B NOT DETECTED NOT DETECTED Final   Parainfluenza Virus 1 NOT DETECTED NOT DETECTED Final   Parainfluenza Virus 2 NOT DETECTED NOT DETECTED Final   Parainfluenza Virus 3 NOT DETECTED NOT DETECTED Final   Parainfluenza Virus 4 NOT DETECTED NOT DETECTED Final   Respiratory Syncytial Virus NOT DETECTED NOT DETECTED Final   Bordetella pertussis NOT DETECTED NOT DETECTED Final   Chlamydophila pneumoniae NOT DETECTED NOT DETECTED Final   Mycoplasma pneumoniae NOT DETECTED NOT DETECTED Final    Comment: [facility]formed [facility]  MRSA PCR Screening     Status: Abnormal   Collection Time: 05/10/16  4:37 AM  Result Value Ref Range Status   MRSA by PCR POSITIVE (A) NEGATIVE Final    Comment:        The GeneXpert MRSA Assay (FDA approved for NASAL specimens only), is one component of a comprehensive MRSA colonization surveillance program. It is not intended to diagnose MRSA infection nor to guide or monitor treatment for MRSA infections. RESULT CALLED TO, READ BACK BY AND VERIFIED WITH: T RYAN AT 0616 ON 11.25.2017 BY NBROOKS          Radiology Studies: Ct Head Wo Contrast  Result Date: 05/13/2016 CLINICAL DATA:  82 year old male with fever, confusion, unresponsive. Sepsis. Underlying dementia. Initial encounter. EXAM: CT HEAD WITHOUT CONTRAST TECHNIQUE: Contiguous axial images were obtained from the base of the skull through the vertex without intravenous contrast. COMPARISON:  None. FINDINGS: Brain: Generalized cerebral volume loss. No cortical encephalomalacia identified. No midline shift, ventriculomegaly, mass effect, evidence of mass lesion, intracranial hemorrhage or evidence of cortically based acute infarction. Mild for  age nonspecific periventricular white matter hypodensity. Otherwise gray-white matter differentiation is within normal limits throughout the brain. Vascular: Calcified atherosclerosis at the skull base. No suspicious intracranial vascular hyperdensity. Skull: No acute osseous abnormality identified. Sinuses/Orbits: At least moderate opacification of the visible posterior ethmoid and right sphenoid sinuses. Visible frontal sinuses, bilateral tympanic cavities, and mastoids are clear. Other: Visualized orbits and scalp soft tissues are within normal limits. IMPRESSION:  1.  No acute intracranial abnormality. 2. Generalized cerebral volume loss, otherwise negative for age noncontrast CT appearance of the brain. 3. Ethmoid and sphenoid sinus inflammatory changes. Consider acute sinusitis. Electronically Signed   By: Odessa Fleming M.D.   On: 05/13/2016 16:16        Scheduled Meds: . amoxicillin  500 mg Oral Q12H  . Chlorhexidine Gluconate Cloth  6 each Topical Q0600  . enoxaparin (LOVENOX) injection  30 mg Subcutaneous Q24H  . feeding supplement (ENSURE ENLIVE)  237 mL Oral TID BM  . free water  200 mL Oral Q6H  . mupirocin ointment  1 application Nasal BID  . polyvinyl alcohol  2 drop Both Eyes BID  . potassium chloride  40 mEq Oral Q4H  . protein supplement  2 scoop Oral TID WC   Continuous Infusions: . dextrose 50 mL/hr at 05/12/16 1750     LOS: 4 days    Time spent: 35 mins    THOMPSON,DANIEL, MD [facility] Pager [phone]  If 7PM-7AM, please contact night-coverage [email or web address] Password [email or web address] 05/14/2016, 1:06 PM

## 2016-05-15 DIAGNOSIS — R4182 Altered mental status, unspecified: Secondary | ICD-10-CM

## 2016-05-15 DIAGNOSIS — Z515 Encounter for palliative care: Secondary | ICD-10-CM

## 2016-05-15 DIAGNOSIS — R509 Fever, unspecified: Secondary | ICD-10-CM

## 2016-05-15 DIAGNOSIS — Z7189 Other specified counseling: Secondary | ICD-10-CM

## 2016-05-15 LAB — CULTURE, BLOOD (ROUTINE X 2)
CULTURE: NO GROWTH
Culture: NO GROWTH

## 2016-05-15 LAB — BASIC METABOLIC PANEL
Anion gap: 4 — ABNORMAL LOW (ref 5–15)
BUN: 9 mg/dL (ref 6–20)
CALCIUM: 8.3 mg/dL — AB (ref 8.9–10.3)
CHLORIDE: 112 mmol/L — AB (ref 101–111)
CO2: 27 mmol/L (ref 22–32)
CREATININE: 0.85 mg/dL (ref 0.61–1.24)
GFR calc Af Amer: >60 mL/min (ref >60)
GFR calc non Af Amer: >60 mL/min (ref >60)
Glucose, Bld: 85 mg/dL (ref 65–99)
Potassium: 4.4 mmol/L (ref 3.5–5.1)
Sodium: 143 mmol/L (ref 135–145)

## 2016-05-15 LAB — MAGNESIUM: Magnesium: 1.8 mg/dL (ref 1.7–2.4)

## 2016-05-15 MED ORDER — OLANZAPINE 5 MG PO TBDP
2.5000 mg | ORAL_TABLET | Freq: Every day | ORAL | Status: DC
  Administered 2016-05-15: 2.5 mg via ORAL
  Filled 2016-05-15 (×2): qty 0.5

## 2016-05-15 MED ORDER — AMOXICILLIN 250 MG/5ML PO SUSR: 500.0000 mg | Freq: Two times a day (BID) | ORAL | Status: DC

## 2016-05-15 MED ORDER — SODIUM CHLORIDE 0.45 % IV SOLN: INTRAVENOUS | Status: DC

## 2016-05-15 MED ORDER — AMOXICILLIN 500 MG PO CAPS
500.0000 mg | ORAL_CAPSULE | Freq: Once | ORAL | Status: DC
  Filled 2016-05-15: qty 1

## 2016-05-15 MED ORDER — ENOXAPARIN SODIUM 40 MG/0.4ML ~~LOC~~ SOLN
40.0000 mg | SUBCUTANEOUS | Status: DC
  Administered 2016-05-16: 40 mg via SUBCUTANEOUS
  Filled 2016-05-15: qty 0.4

## 2016-05-15 MED ORDER — AMOXICILLIN 250 MG/5ML PO SUSR
500.0000 mg | Freq: Two times a day (BID) | ORAL | Status: DC
  Administered 2016-05-15 – 2016-05-16 (×2): 500 mg via ORAL
  Filled 2016-05-15 (×4): qty 10

## 2016-05-15 MED ORDER — SODIUM CHLORIDE 0.45 % IV SOLN
INTRAVENOUS | Status: DC
  Administered 2016-05-15: 11:00:00 via INTRAVENOUS

## 2016-05-15 MED ORDER — HALOPERIDOL LACTATE 5 MG/ML IJ SOLN
0.5000 mg | Freq: Four times a day (QID) | INTRAMUSCULAR | Status: DC | PRN
  Administered 2016-05-15: 0.5 mg via INTRAVENOUS
  Filled 2016-05-15: qty 1

## 2016-05-15 MED ORDER — OLANZAPINE 5 MG PO TBDP
5.0000 mg | ORAL_TABLET | Freq: Every day | ORAL | Status: DC
  Filled 2016-05-15: qty 1

## 2016-05-15 NOTE — Progress Notes (Signed)
PROGRESS NOTE    Wayne Schroeder  ZOX:[id number] DOB: 04-02-1925 DOA: 05/10/2016 PCP: Martha Clan, MD    Brief Narrative:  82 year old male with advanced dementia presented from memory care unit with one-day history of fever. Patient was found to have change in mental status with increased aggression.   Assessment & Plan:   Principal Problem:   Sepsis, unspecified organism (HCC) Active Problems:   Unspecified dementia with behavioral disturbance   Macrocytic anemia   Protein-calorie malnutrition, severe   Acute cystitis without hematuria   Essential hypertension   Pressure injury of skin   Acute kidney injury (nontraumatic) (HCC)   Urinary tract infection without hematuria  #1 sepsis likely secondary to Escherichia coli UTI Patient with clinical improvement. Afebrile. Leukocytosis is trended down. Patient was on IV Rocephin and currently now on oral amoxicillin to complete course of antibiotic treatment.  #2 Escherichia coli UTI Patient was on IV Rocephin and now currently on oral amoxicillin.  #3 metabolic encephalopathy Likely secondary to UTI in the setting of advanced Alzheimer's dementia. Improving clinically. Likely close to baseline. Continue empiric antibiotics.  #4 advanced Alzheimer's dementia with behavioral disturbance Patient with history of severe dementia with agitation likely secondary to UTI and sepsis. Agitation improving. Seroquel, tramadol, Lexapro and trazodone have been held. Patient has been seen by speech therapy who were recommending full liquid diet. Dr. Gonzella Lex discussed with patient's wife and daughter and it was noted that at baseline patient has severe dementia, poorly communicative. CT head obtained was negative. Patient with poor oral intake. Patient occasionally spitting out medications. Palliative care consultation has been obtained for goals of care. Palliative care meeting with family this afternoon.  #5 hypernatremia Likely secondary to  dehydration. Improved on D5W and free water. Follow.  #6 hypokalemia Patient spitting out oral supplementation of potassium. Potassium has been repleted. Remove potassium from IV fluids.   #7 severe protein calorie malnutrition Nutritional supplementation.  #8 stage I pressure ulcer Skin intact with normal blanchable redness of a localized area usually over bony prominence. Monitor closely.   DVT prophylaxis: Lovenox Code Status: DO NOT RESUSCITATE Family Communication: No family at bedside. Disposition Plan: Back to assisted living facility at the memory unit with possibly palliative care following pending palliative care meeting. Hopefully in the next 24-48 hours.   Consultants:   Palliative care: Murrell Converse, NP 05/14/2016  Procedures:   CT head without contrast 05/13/2016  Chest x-ray 05/10/2016  Antimicrobials:   IV Rocephin 05/10/2016>>>>> 05/13/2016  Oral amoxicillin 05/13/2016  IV Zosyn 05/10/2016 1 dose  IV vancomycin 05/10/2016 1 dose   Subjective: Patient is pleasantly confused. No chest pain. No shortness of breath. Per nursing patient spitting out medications and food. Patient stating in the room that he is hungry and wants something to eat.  Objective: Vitals:   05/13/16 2129 05/14/16 0437 05/14/16 2004 05/15/16 0545  BP: 132/61 135/78 136/81 (!) 101/51  Pulse: 83 87 83 63  Resp: 18 18 18 18   Temp: 98 F (36.7 C) 99.1 F (37.3 C) 98.3 F (36.8 C) 97.7 F (36.5 C)  TempSrc: Axillary Axillary Axillary Axillary  SpO2: 92% 94% 95% 94%  Weight:      Height:        Intake/Output Summary (Last 24 hours) at 05/15/16 1051 Last data filed at 05/15/16 0936  Gross per 24 hour  Intake           996.25 ml  Output  600 ml  Net           396.25 ml   Filed Weights   05/10/16 0414  Weight: 49.9 kg (110 lb 0.2 oz)    Examination:  General exam: Pleasantly confused. Respiratory system: Clear to auscultation anterior lung fields.  Respiratory effort normal. Cardiovascular system: S1 & S2 heard, RRR. No JVD, murmurs, rubs, gallops or clicks. No pedal edema. Gastrointestinal system: Abdomen is nondistended, soft and nontender. No organomegaly or masses felt. Normal bowel sounds heard. Central nervous system: Pleasantly confused. Moving all extremities spontaneously.  Extremities: Symmetric 5 x 5 power. Skin: No rashes, lesions or ulcers Psychiatry: Judgement and insight appear poor. Mood & affect fair.     Data Reviewed: I have personally reviewed following labs and imaging studies  CBC:  Recent Labs Lab 05/10/16 0043 05/10/16 0553 05/11/16 0612  WBC 13.3* 13.1* 10.4  NEUTROABS 12.0*  --   --   HGB 11.2* 10.2* 10.1*  HCT 36.0* 32.6* 32.0*  MCV 102.3* 100.3* 100.3*  PLT 201 187 179   Basic Metabolic Panel:  Recent Labs Lab 05/10/16 0553 05/11/16 0612 05/12/16 0527 05/14/16 0940 05/15/16 0615  NA 144 147* 143 138 143  K 3.4* 3.9 3.5 3.0* 4.4  CL 107 116* 111 105 112*  CO2 29 24 28 27 27   GLUCOSE 129* 92 108* 135* 85  BUN 25* 20 14 10 9   CREATININE 1.36* 0.98 0.88 0.67 0.85  CALCIUM 8.0* 8.4* 8.5* 8.4* 8.3*  MG 1.8  --   --  1.8 1.8   GFR: Estimated Creatinine Clearance: 41.6 mL/min (by C-G formula based on SCr of 0.85 mg/dL). Liver Function Tests:  Recent Labs Lab 05/10/16 0043  AST 28  ALT 16*  ALKPHOS 63  BILITOT 1.1  PROT 6.8  ALBUMIN 3.6   No results for input(s): LIPASE, AMYLASE in the last 168 hours. No results for input(s): AMMONIA in the last 168 hours. Coagulation Profile:  Recent Labs Lab 05/10/16 0043  INR 1.04   Cardiac Enzymes: No results for input(s): CKTOTAL, CKMB, CKMBINDEX, TROPONINI in the last 168 hours. BNP (last 3 results) No results for input(s): PROBNP in the last 8760 hours. HbA1C: No results for input(s): HGBA1C in the last 72 hours. CBG: No results for input(s): GLUCAP in the last 168 hours. Lipid Profile: No results for input(s): CHOL, HDL,  LDLCALC, TRIG, CHOLHDL, LDLDIRECT in the last 72 hours. Thyroid Function Tests: No results for input(s): TSH, T4TOTAL, FREET4, T3FREE, THYROIDAB in the last 72 hours. Anemia Panel: No results for input(s): VITAMINB12, FOLATE, FERRITIN, TIBC, IRON, RETICCTPCT in the last 72 hours. Sepsis Labs:  Recent Labs Lab 05/10/16 0100  LATICACIDVEN 1.26    Recent Results (from the past 240 hour(s))  Culture, blood (Routine x 2)     Status: None (Preliminary result)   Collection Time: 05/10/16 12:43 AM  Result Value Ref Range Status   Specimen Description BLOOD RIGHT ARM  Final   Special Requests BOTTLES DRAWN AEROBIC AND ANAEROBIC  Final   Culture   Final    NO GROWTH 4 DAYS Performed at [facility]    Report Status PENDING  Incomplete  Culture, blood (Routine x 2)     Status: None (Preliminary result)   Collection Time: 05/10/16 12:50 AM  Result Value Ref Range Status   Specimen Description BLOOD RIGHT FOREARM  Final   Special Requests BOTTLES DRAWN AEROBIC ONLY  Final   Culture   Final  NO GROWTH 4 DAYS [facility]    Report Status PENDING  Incomplete  Urine culture     Status: Abnormal   Collection Time: 05/10/16  1:47 AM  Result Value Ref Range Status   Specimen Description URINE, CATHETERIZED  Final   Special Requests NONE  Final   Culture >=100,000 COLONIES/mL ESCHERICHIA COLI (A)  Final   Report Status 05/12/2016 FINAL  Final   Organism ID, Bacteria ESCHERICHIA COLI (A)  Final      Susceptibility   Escherichia coli - MIC*    AMPICILLIN <=2 SENSITIVE Sensitive     CEFAZOLIN <=4 SENSITIVE Sensitive     CEFTRIAXONE <=1 SENSITIVE Sensitive     CIPROFLOXACIN <=0.25 SENSITIVE Sensitive     GENTAMICIN <=1 SENSITIVE Sensitive     IMIPENEM <=0.25 SENSITIVE Sensitive     NITROFURANTOIN <=16 SENSITIVE Sensitive     TRIMETH/SULFA <=20 SENSITIVE Sensitive     AMPICILLIN/SULBACTAM <=2 SENSITIVE Sensitive     PIP/TAZO <=4 SENSITIVE Sensitive      Extended ESBL NEGATIVE Sensitive     * >=100,000 COLONIES/mL ESCHERICHIA COLI  Respiratory Panel by PCR     Status: Abnormal   Collection Time: 05/10/16  3:39 AM  Result Value Ref Range Status   Adenovirus NOT DETECTED NOT DETECTED Final   Coronavirus 229E NOT DETECTED NOT DETECTED Final   Coronavirus HKU1 NOT DETECTED NOT DETECTED Final   Coronavirus NL63 NOT DETECTED NOT DETECTED Final   Coronavirus OC43 NOT DETECTED NOT DETECTED Final   Metapneumovirus DETECTED (A) NOT DETECTED Final   Rhinovirus / Enterovirus NOT DETECTED NOT DETECTED Final   Influenza A NOT DETECTED NOT DETECTED Final   Influenza B NOT DETECTED NOT DETECTED Final   Parainfluenza Virus 1 NOT DETECTED NOT DETECTED Final   Parainfluenza Virus 2 NOT DETECTED NOT DETECTED Final   Parainfluenza Virus 3 NOT DETECTED NOT DETECTED Final   Parainfluenza Virus 4 NOT DETECTED NOT DETECTED Final   Respiratory Syncytial Virus NOT DETECTED NOT DETECTED Final   Bordetella pertussis NOT DETECTED NOT DETECTED Final   Chlamydophila pneumoniae NOT DETECTED NOT DETECTED Final   Mycoplasma pneumoniae NOT DETECTED NOT DETECTED Final    Comment: Performed at [facility]  MRSA PCR Screening     Status: Abnormal   Collection Time: 05/10/16  4:37 AM  Result Value Ref Range Status   MRSA by PCR POSITIVE (A) NEGATIVE Final    Comment:        The GeneXpert MRSA Assay (FDA approved for NASAL specimens only), is one component of a comprehensive MRSA colonization surveillance program. It is not intended to diagnose MRSA infection nor to guide or monitor treatment for MRSA infections. RESULT CALLED TO, READ BACK BY AND VERIFIED WITH: T RYAN AT 0616 ON 11.25.2017 BY NBROOKS          Radiology Studies: Ct Head Wo Contrast  Result Date: 05/13/2016 CLINICAL DATA:  82 year old male with fever, confusion, unresponsive. Sepsis. Underlying dementia. Initial encounter. EXAM: CT HEAD WITHOUT CONTRAST TECHNIQUE: Contiguous axial  images were obtained from the base of the skull through the vertex without intravenous contrast. COMPARISON:  None. FINDINGS: Brain: Generalized cerebral volume loss. No cortical encephalomalacia identified. No midline shift, ventriculomegaly, mass effect, evidence of mass lesion, intracranial hemorrhage or evidence of cortically based acute infarction. Mild for age nonspecific periventricular white matter hypodensity. Otherwise gray-white matter differentiation is within normal limits throughout the brain. Vascular: Calcified atherosclerosis at the skull base. No suspicious intracranial vascular  hyperdensity. Skull: No acute osseous abnormality identified. Sinuses/Orbits: At least moderate opacification of the visible posterior ethmoid and right sphenoid sinuses. Visible frontal sinuses, bilateral tympanic cavities, and mastoids are clear. Other: Visualized orbits and scalp soft tissues are within normal limits. IMPRESSION: 1.  No acute intracranial abnormality. 2. Generalized cerebral volume loss, otherwise negative for age noncontrast CT appearance of the brain. 3. Ethmoid and sphenoid sinus inflammatory changes. Consider acute sinusitis. Electronically Signed   By: Odessa FlemingH  Hall M.D.   On: 05/13/2016 16:16        Scheduled Meds: . amoxicillin  500 mg Oral Q12H  . enoxaparin (LOVENOX) injection  30 mg Subcutaneous Q24H  . feeding supplement (ENSURE ENLIVE)  237 mL Oral TID BM  . free water  200 mL Oral Q6H  . polyvinyl alcohol  2 drop Both Eyes BID  . protein supplement  2 scoop Oral TID WC   Continuous Infusions: . sodium chloride 0.9 % 1,000 mL with potassium chloride 40 mEq infusion 75 mL/hr at 05/15/16 0858     LOS: 5 days    Time spent: 35 mins    Vaishali Baise, MD [facility] Pager [phone]  If 7PM-7AM, please contact night-coverage [email or web address] Password [email or web address] 05/15/2016, 10:51 AM

## 2016-05-15 NOTE — Consult Note (Signed)
Consultation Note Date: 05/15/2016   Patient Name: Wayne Schroeder  DOB: 14-Dec-1924  MRN: [id number]  Age / Sex: 82 y.o., male  PCP: Martha ClanWilliam Shaw, MD Referring Physician: Rodolph Bonganiel V Thompson, MD  Reason for Consultation: Disposition, Establishing goals of care and Psychosocial/spiritual support  HPI/Patient Profile: 82 y.o. male  with past medical history of dvanced dementia presented from memory care unit with one-day history of fever. Patient was found to have change in mental status with increased aggression. He was admitted on 05/10/2016 with workup revealing sepsis from an E.Coli UTI.   Clinical Assessment and Goals of Care: [name] has clinically improved with leukocytosis trending down, afebrile, and improving agitation. He does remain more confused than his baseline, and is persistently refusing food and fluid. Per his family, his refusal of food when away from [facility] home environment ([facility]) is not abnormal.   In conversations with his wife and daughter, they both express that he has demonstrated a slow deterioration over the past year. He was, however, consistently feeding himself and maintaing his weight prior to hospitalization. We discussed the seriousness of the infection he had, and the real potential that other infections may follow given his overall debilitated state. Since this was his first hospitalization in the past six months, they are hopeful that this is an isolated incident that he will improve from. We discussed two levels of support that I believe he would be eligible for: Palliative Care and Hospice, both [facility]. After reviewing both, they felt starting with Palliative Care for now would be ideal. They feel that if he starts to have more frequent infections, or [facility] and does not start consistently eating, they would then consider  Hospice.   Primary Decision Maker Pt's wife is primary Management consultantdecision maker (with HCPOA paperwork), however his daughter is also actively involved with his care.     SUMMARY OF RECOMMENDATIONS    D/c to [facility] with Palliative Care when deemed medically appropriate by primary team  Please attach last Palliative Care note to discharge paperwork, per family's request  Code Status/Advance Care Planning:  DNR  Symptom Management:   Agitation: Will try Zyrprexa ODT 2.5mg  qHS, continue Haldol 0.5mg  IV q6H PRN   Palliative Prophylaxis:   Aspiration, Bowel Regimen, Frequent Pain Assessment, Oral Care, Palliative Wound Care and Turn Reposition  Additional Recommendations (Limitations, Scope, Preferences):  Full Scope Treatment  Psycho-social/Spiritual:   Desire for further Chaplaincy support:no  Prognosis:   Unable to determine  Discharge Planning: [facility]      Primary Diagnoses: Present on Admission: . Acute cystitis without hematuria . Unspecified dementia with behavioral disturbance . Macrocytic anemia . Protein-calorie malnutrition, severe . Sepsis, unspecified organism (HCC) . Essential hypertension   I have reviewed the medical record, interviewed the patient and family, and examined the patient. The following aspects are pertinent.  Past Medical History:  Diagnosis Date  . Dementia    Social History   Social  History  . Marital status: Married    Spouse name: N/A  . Number of children: N/A  . Years of education: N/A   Social History Main Topics  . Smoking status: Never Smoker  . Smokeless tobacco: Never Used  . Alcohol use No  . Drug use: No  . Sexual activity: Not Asked   Other Topics Concern  . None   Social History Narrative  . None   Family History  Problem Relation Age of Onset  . Dementia Other    Scheduled Meds: . amoxicillin  500 mg Oral Q12H  . [START ON 05/16/2016] enoxaparin (LOVENOX)  injection  40 mg Subcutaneous Q24H  . feeding supplement (ENSURE ENLIVE)  237 mL Oral TID BM  . free water  200 mL Oral Q6H  . OLANZapine zydis  5 mg Oral QHS  . polyvinyl alcohol  2 drop Both Eyes BID  . protein supplement  2 scoop Oral TID WC   Continuous Infusions: . sodium chloride 75 mL/hr at 05/15/16 1108   PRN Meds:.acetaminophen **OR** acetaminophen, haloperidol lactate, ondansetron **OR** ondansetron (ZOFRAN) IV No Known Allergies   Review of Systems: Unable to obtain d/t mentation  Physical Exam  Constitutional: He appears lethargic. He has a sickly appearance.  Thin and frail man in bed  HENT:  Head: Normocephalic and atraumatic.  Mouth/Throat: Oropharynx is clear and moist.  Eyes: EOM are normal.  Neck: Normal range of motion.  Cardiovascular: Normal rate.   Pulmonary/Chest: Effort normal and breath sounds normal.  Abdominal: Soft. Bowel sounds are normal.  Neurological: He appears lethargic. He is disoriented.  Advanced dementia, not verbally interactive today  Skin: Skin is warm and dry. There is pallor.  Skin errythema over saccrum   Vital Signs: BP (!) 142/78 (BP Location: Right Arm)   Pulse 78   Temp 99.5 F (37.5 C) (Axillary)   Resp 19   Ht 5\' 10"  (1.778 m)   Wt 49.9 kg (110 lb 0.2 oz)   SpO2 98%   BMI 15.78 kg/m  Pain Assessment: PAINAD   Pain Score: 0-No pain   SpO2: SpO2: 98 % O2 Device:SpO2: 98 % O2 Flow Rate: .   IO: Intake/output summary:  Intake/Output Summary (Last 24 hours) at 05/15/16 1812 Last data filed at 05/15/16 [id number]  Gross per 24 hour  Intake          1461.25 ml  Output              600 ml  Net           861.25 ml    LBM: Last BM Date: 05/13/16 Baseline Weight: Weight: 49.9 kg (110 lb 0.2 oz) Most recent weight: Weight: 49.9 kg (110 lb 0.2 oz)     Palliative Assessment/Data:  PPS 40%   Flowsheet Rows   Flowsheet Row Most Recent Value  Intake Tab  Referral Department  Hospitalist  Unit at Time of Referral   Med/Surg Unit  Palliative Care Primary Diagnosis  Neurology  Date Notified  05/14/16  Palliative Care Type  New Palliative care  Reason for referral  Clarify Goals of Care  Date of Admission  05/10/16  Date first seen by Palliative Care  05/14/16  # of days Palliative referral response time  0 Day(s)  # of days IP prior to Palliative referral  4  Clinical Assessment  Psychosocial & Spiritual Assessment  Palliative Care Outcomes      Time In: 1720 Time Out: 1830 Time Total: 70 minutes  Greater than 50%  of this time was spent counseling and coordinating care related to the above assessment and plan.  Signed by: Murrell Converse, NP Palliative Medicine Team Team Phone # [phone]

## 2016-05-16 DIAGNOSIS — A4151 Sepsis due to Escherichia coli [E. coli]: Principal | ICD-10-CM

## 2016-05-16 LAB — BASIC METABOLIC PANEL
Anion gap: 8 (ref 5–15)
BUN: 11 mg/dL (ref 6–20)
CHLORIDE: 108 mmol/L (ref 101–111)
CO2: 23 mmol/L (ref 22–32)
CREATININE: 0.83 mg/dL (ref 0.61–1.24)
Calcium: 8 mg/dL — ABNORMAL LOW (ref 8.9–10.3)
GFR calc non Af Amer: >60 mL/min (ref >60)
Glucose, Bld: 70 mg/dL (ref 65–99)
POTASSIUM: 4.4 mmol/L (ref 3.5–5.1)
Sodium: 139 mmol/L (ref 135–145)

## 2016-05-16 MED ORDER — OLANZAPINE 5 MG PO TBDP: 2.5000 mg | ORAL_TABLET | Freq: Every day | ORAL | 0 refills | Status: DC

## 2016-05-16 MED ORDER — HALOPERIDOL 0.5 MG PO TABS: 0.5000 mg | ORAL_TABLET | Freq: Four times a day (QID) | ORAL | 0 refills | Status: DC | PRN

## 2016-05-16 MED ORDER — AMOXICILLIN 250 MG/5ML PO SUSR: 500.0000 mg | Freq: Two times a day (BID) | ORAL | 0 refills | Status: DC

## 2016-05-16 MED ORDER — AMOXICILLIN 250 MG/5ML PO SUSR: 500.0000 mg | Freq: Two times a day (BID) | ORAL | 0 refills | Status: AC

## 2016-05-16 MED ORDER — TRAMADOL HCL 50 MG PO TABS: 50.0000 mg | ORAL_TABLET | Freq: Two times a day (BID) | ORAL | 0 refills | Status: AC | PRN

## 2016-05-16 MED ORDER — HALOPERIDOL 0.5 MG PO TABS: 0.5000 mg | ORAL_TABLET | Freq: Four times a day (QID) | ORAL | 0 refills | Status: AC | PRN

## 2016-05-16 MED ORDER — OLANZAPINE 5 MG PO TBDP: 2.5000 mg | ORAL_TABLET | Freq: Every day | ORAL | 0 refills | Status: AC

## 2016-05-16 MED ORDER — FREE WATER: 200.0000 mL | Freq: Four times a day (QID) | Status: AC

## 2016-05-16 MED ORDER — ENSURE ENLIVE PO LIQD: 237.0000 mL | Freq: Three times a day (TID) | ORAL | 12 refills | Status: AC

## 2016-05-16 NOTE — Care Management Note (Signed)
Case Management Note  Patient Details  Name: Wayne SimmeringJohn R Schroeder MRN: [id number] Date of Birth: 1925/03/27  Subjective/Objective:  PT-no f/u. D/c ALF-CSW managing.No CM needs or orders.                  Action/Plan:d/c ALF   Expected Discharge Date:                  Expected Discharge Plan:  Assisted Living / Rest Home  In-House Referral:  Clinical Social Work  Discharge planning Services  CM Consult  Post Acute Care Choice:    Choice offered to:     DME Arranged:    DME Agency:     HH Arranged:    HH Agency:     Status of Service:  Completed, signed off  If discussed at [employer] Meetings, dates discussed:    Additional Comments:  Lanier ClamMahabir, Roderick Calo, RN 05/16/2016, 4:00 PM

## 2016-05-16 NOTE — Clinical Social Work Placement (Addendum)
   CLINICAL SOCIAL WORK PLACEMENT  NOTE  Date:  05/16/2016  Patient Details  Name: Wayne SimmeringJohn R Talley MRN: [id number] Date of Birth: [birth year]  Clinical Social Work is seeking post-discharge placement for this patient at the Assisted Living Facility level of care (*CSW will initial, date and re-position this form in  chart as items are completed):  No   Patient/family provided with [facility] Clinical Social Work Department's list of facilities offering this level of care within the geographic area requested by the patient (or if unable, by the patient's family).  No   Patient/family informed of their freedom to choose among providers that offer the needed level of care, that participate in Medicare, Medicaid or managed care program needed by the patient, have an available bed and are willing to accept the patient.  No   Patient/family informed of [place]'s ownership interest in [facility] and [facility], as well as of the fact that they are under no obligation to receive care at these facilities.  PASRR submitted to EDS on       PASRR number received on       Existing PASRR number confirmed on       FL2 transmitted to all facilities in geographic area requested by pt/family on       FL2 transmitted to all facilities within larger geographic area on       Patient informed that his/her managed care company has contracts with or will negotiate with certain facilities, including the following:  [facility]     Yes   Patient/family informed of bed offers received.  [facility]     Physician recommends and [facility]    [facility].  Patient to be transferred to facility by [facility]     Patient family notified on 05/16/16 of transfer.  Name of family member notified:    left voicemail for patient son and daughter. Could not  leave voicemail for spouse.     PHYSICIAN Please prepare priority discharge summary, including medications     Additional Comment:    _______________________________________________ Clearance CootsNicole A Miyah Hampshire, LCSW 05/16/2016, 3:49 PM

## 2016-05-16 NOTE — NC FL2 (Signed)
[place] MEDICAID FL2 LEVEL OF CARE SCREENING TOOL     IDENTIFICATION  Patient Name: Wayne Schroeder Birthdate: 04-28-1925 Sex: male Admission Date (Current Location): 05/10/2016  [facility] and [place] Number:  Producer, television/film/video and Address:  [facility],  [address], [place] [address]      Provider Number: [id number]  Attending Physician Name and Address:  Rodolph Bong, MD  Relative Name and Phone Number:       Current Level of Care: Hospital Recommended Level of Care: Assisted Living Facility (Memory Care Unit) Prior Approval Number:    Date Approved/Denied:   PASRR Number:    Discharge Plan:  [facility] Unit)    Current Diagnoses: Patient Active Problem List   Diagnosis Date Noted  . Altered mental status   . Fever   . Goals of care, counseling/discussion   . Acute kidney injury (nontraumatic) (HCC)   . Urinary tract infection without hematuria   . Acute cystitis without hematuria 05/10/2016  . Essential hypertension 05/10/2016  . Pressure injury of skin 05/10/2016  . Palliative care encounter   . Hypokalemia 03/31/2015  . Acute renal failure (ARF) (HCC)   . Urinary tract infectious disease   . Protein-calorie malnutrition, severe 03/30/2015  . Sepsis, unspecified organism (HCC) 03/29/2015  . Acute hypernatremia 03/29/2015  . Elevated troponin 03/28/2015  . UTI (urinary tract infection) 03/28/2015  . Leukocytosis 03/25/2015  . ARF (acute renal failure) (HCC) 03/25/2015  . Macrocytic anemia 03/24/2015  . Unspecified dementia with behavioral disturbance 03/22/2015    Orientation RESPIRATION BLADDER Height & Weight     Self  Normal Incontinent Weight: 110 lb 0.2 oz (49.9 kg) Height:  5\' 10"  (177.8 cm)  BEHAVIORAL SYMPTOMS/MOOD NEUROLOGICAL BOWEL NUTRITION STATUS      Incontinent Diet (See D/C Summary )  AMBULATORY STATUS COMMUNICATION OF NEEDS Skin   Total Care Verbally Normal                        Personal Care Assistance Level of Assistance  Bathing, Feeding, Dressing, Total care Bathing Assistance: Maximum assistance Feeding assistance: Maximum assistance Dressing Assistance: Maximum assistance Total Care Assistance: Maximum assistance   Functional Limitations Info  Sight, Hearing, Speech Sight Info: Adequate Hearing Info: Adequate Speech Info: Adequate    SPECIAL CARE FACTORS FREQUENCY  PT (By licensed PT), Speech therapy             Speech Therapy Frequency: 5      Contractures      Additional Factors Info  Code Status, Allergies, Isolation Precautions Code Status Info: DNR Allergies Info: No Known Allergies      Isolation Precautions Info: MRSA     Current Medications (05/16/2016):  This is the current hospital active medication list Current Facility-Administered Medications  Medication Dose Route Frequency Provider Last Rate Last Dose  . acetaminophen (TYLENOL) tablet 650 mg  650 mg Oral Q6H PRN Alberteen Sam, MD   650 mg at 05/11/16 0041   Or  . acetaminophen (TYLENOL) suppository 650 mg  650 mg Rectal Q6H PRN Alberteen Sam, MD   650 mg at 05/11/16 1345  . amoxicillin (AMOXIL) 250 MG/5ML suspension 500 mg  500 mg Oral Q12H Rodolph Bong, MD   500 mg at 05/16/16 0953  . enoxaparin (LOVENOX) injection 40 mg  40 mg Subcutaneous Q24H Rodolph Bong, MD   40 mg at 05/16/16 0953  . feeding supplement (ENSURE ENLIVE) (  ENSURE ENLIVE) liquid 237 mL  237 mL Oral TID BM Kathlen ModyVijaya Akula, MD   237 mL at 05/16/16 0953  . free water 200 mL  200 mL Oral Q6H Nishant Dhungel, MD   200 mL at 05/11/16 2000  . haloperidol lactate (HALDOL) injection 0.5 mg  0.5 mg Intravenous Q6H PRN Rodolph Bonganiel V Thompson, MD   0.5 mg at 05/15/16 1530  . OLANZapine zydis (ZYPREXA) disintegrating tablet 2.5 mg  2.5 mg Oral QHS Ranae PalmsSarah Elizabeth Dunn, NP   2.5 mg at 05/15/16 2200  . ondansetron (ZOFRAN) tablet 4 mg  4 mg Oral Q6H PRN Alberteen Samhristopher P Danford, MD       Or  .  ondansetron (ZOFRAN) injection 4 mg  4 mg Intravenous Q6H PRN Alberteen Samhristopher P Danford, MD      . polyvinyl alcohol (LIQUIFILM TEARS) 1.4 % ophthalmic solution 2 drop  2 drop Both Eyes BID Alberteen Samhristopher P Danford, MD   2 drop at 05/16/16 0953  . protein supplement (RESOURCE BENEPROTEIN) powder packet 12 g  2 scoop Oral TID WC Kathlen ModyVijaya Akula, MD   12 g at 05/16/16 [id number]     Discharge Medications: Please see discharge summary for a list of discharge medications. Current Discharge Medication List        START taking these medications   Details  amoxicillin (AMOXIL) 250 MG/5ML suspension Take 10 mLs (500 mg total) by mouth every 12 (twelve) hours. Take for 3 days then stop. Qty: 150 mL, Refills: 0    feeding supplement, ENSURE ENLIVE, (ENSURE ENLIVE) LIQD Take 237 mLs by mouth 3 (three) times daily between meals. Qty: 237 mL, Refills: 12    haloperidol (HALDOL) 0.5 MG tablet Take 1 tablet (0.5 mg total) by mouth every 6 (six) hours as needed for agitation. Qty: 15 tablet, Refills: 0    OLANZapine zydis (ZYPREXA) 5 MG disintegrating tablet Take 0.5 tablets (2.5 mg total) by mouth at bedtime. Qty: 20 tablet, Refills: 0    Water For Irrigation, Sterile (FREE WATER) SOLN Take 200 mLs by mouth every 6 (six) hours.          CONTINUE these medications which have CHANGED   Details  traMADol (ULTRAM) 50 MG tablet Take 1 tablet (50 mg total) by mouth 2 (two) times daily as needed for moderate pain. Qty: 20 tablet, Refills: 0          CONTINUE these medications which have NOT CHANGED   Details  escitalopram (LEXAPRO) 10 MG tablet Take 10 mg by mouth daily.    Eyelid Cleansers (OCUSOFT EYELID CLEANSING) PADS Apply topically 2 (two) times daily.    loperamide (IMODIUM A-D) 2 MG tablet Take 2 mg by mouth 4 (four) times daily as needed for diarrhea or loose stools.    ondansetron (ZOFRAN) 4 MG tablet Take 1 tablet (4 mg total) by mouth every 6 (six) hours as needed for nausea. Qty:  20 tablet, Refills: 0    pantoprazole (PROTONIX) 40 MG tablet Take 1 tablet (40 mg total) by mouth daily. Qty: 30 tablet, Refills: 0    Propylene Glycol (SYSTANE BALANCE) 0.6 % SOLN Apply 2 drops to eye 2 (two) times daily.    Protein (PROCEL 100) POWD Take 2 scoop by mouth 2 (two) times daily.    UNABLE TO FIND Take 1 each by mouth 2 (two) times daily. Mighty shake         STOP taking these medications     hydrochlorothiazide (HYDRODIURIL) 25 MG tablet  LORazepam (ATIVAN) 0.5 MG tablet      QUEtiapine (SEROQUEL) 25 MG tablet      traZODone (DESYREL) 50 MG tablet      antiseptic oral rinse (CPC / CETYLPYRIDINIUM CHLORIDE 0.05%) 0.05 % LIQD solution      chlorhexidine (PERIDEX) 0.12 % solution        Relevant Imaging Results:  Relevant Lab Results:   Additional Information ss#[id number]  Clearance CootsNicole A Damonie Furney, LCSW

## 2016-05-16 NOTE — Discharge Summary (Signed)
Physician Discharge Summary  Wayne Schroeder XLK:[id number] DOB: 03/19/1925 DOA: 05/10/2016  PCP: Marton Redwood, MD  Admit date: 05/10/2016 Discharge date: 05/16/2016  Time spent: 65 minutes  Recommendations for Outpatient Follow-up:  1. Follow-up with M.D. at [place] 2. Palliative care to follow up with patient at [place] [facility]   Discharge Diagnoses:  Principal Problem:   Sepsis, unspecified organism [facility]) Active Problems:   Unspecified dementia with behavioral disturbance   Macrocytic anemia   Protein-calorie malnutrition, severe   Acute cystitis without hematuria   Essential hypertension   Pressure injury of skin   Acute kidney injury (nontraumatic) (HCC)   Urinary tract infection without hematuria   Altered mental status   Fever   Goals of care, counseling/discussion   Discharge Condition: Stable  Diet recommendation: Dysphagia 3 diet with thin liquids.  Filed Weights   05/10/16 0414  Weight: 49.9 kg (110 lb 0.2 oz)    History of present illness:  Per Dr.Danford Wayne Schroeder is a 82 y.o. male with a past medical history significant for advanced dementia who presented with fever for 1 day.  Caveat that all history is collected from nursing staff at [facility] by phone and EMS via [place].  The patient was in his usual state of health until the last week when he was noted to have some increased aggression, and staff at his facility attempted to send a urinalysis.  Then on the night of admission, at second shift, the patient was noted to be warm and temp at his facility was 101.41F, and so he was transported to the ER.  ED course: -Temp 100.72F, heart rate 100, respirations 16 and pulse ox normal, BP 118/61 -Na 143, K 3.2, Cr 1.31 (baseline 0.9), WBC 13.3 K, Hgb 11.2, macrocytic, chronic -INR normal -Lactate 1.26 -The patient was given broad-spectrum antibiotics and IV fluids while still awaiting urinalysis and [facility] were asked to admit for sepsis,  unknown source -Subsequent urinalysis showed pyuria and bacteria     Hospital Course:  #1 sepsis likely secondary to Escherichia coli UTI Patient was admitted with altered mental status a urinalysis consistent with a UTI. On admission patient met criteria for SIRS with tachycardia, tachypnea, fever and a leukocytosis. Patient was pancultured and placed on IV fluids and empiric IV antibiotics of IV Rocephin. Patient improved slowly and clinically during the hospitalization. Urine cultures came back positive for Escherichia coli UTI. Patient was subsequently transitioned to oral amoxicillin and will be discharged home on 3 more days of oral amoxicillin to complete a course of antibiotic treatment. Patient improved clinically leukocytosis trended down and patient was afebrile. Patient be discharged in stable and improved condition.   #2 Escherichia coli UTI Patient on admission urinalysis was consistent with a UTI. Patient was placed empirically on IV Rocephin. Urine cultures came back positive for Escherichia coli. Patient was subsequently transitioned to oral amoxicillin on be discharged on 2 more days of oral amoxicillin to complete a course of antibiotic treatment.   #3 metabolic encephalopathy Likely secondary to UTI in the setting of advanced Alzheimer's dementia. Improved clinically with treatment of patient's urinary tract infection. Patient improved clinically and was likely close to baseline by day of discharge.  #4 advanced Alzheimer's dementia with behavioral disturbance Patient with history of severe dementia with agitation likely secondary to UTI and sepsis. Agitation improving. Seroquel, tramadol, Lexapro and trazodone have been held. Patient has been seen by speech therapy who were recommending full liquid diet. Dr. Clementeen Graham discussed with patient's  wife and daughter and it was noted that at baseline patient has severe dementia, poorly communicative. CT head obtained was negative.  Patient with poor oral intake. Patient occasionally spitting out medications. Palliative care consultation has been obtained for goals of care. Palliative care met with patient's family and it was [facility] to follow.   #5 hypernatremia Likely secondary to dehydration. Improved with D5W and free water.   #6 hypokalemia Patient spitting out oral supplementation of potassium. Potassium has been repleted.  #7 severe protein calorie malnutrition Nutritional supplementation.  #8 stage I pressure ulcer Skin intact with normal blanchable redness of a localized area usually over bony prominence. Monitor closely.   Procedures:  CT head without contrast 05/13/2016  Chest x-ray 05/10/2016  Consultations:  Palliative care: Charlynn Court, NP 05/14/2016  Discharge Exam: Vitals:   05/16/16 0538 05/16/16 1401  BP: 121/87 123/87  Pulse: 60 60  Resp: 18 18  Temp: 97.5 F (36.4 C) 97.6 F (36.4 C)    General: NAD Cardiovascular: RRR Respiratory: CTAB  Discharge Instructions   Discharge Instructions    Diet - low sodium heart healthy    Complete by:  As directed    Dysphagia 3 diet with thin liquids.   Increase activity slowly    Complete by:  As directed      Current Discharge Medication List    START taking these medications   Details  amoxicillin (AMOXIL) 250 MG/5ML suspension Take 10 mLs (500 mg total) by mouth every 12 (twelve) hours. Take for 3 days then stop. Qty: 150 mL, Refills: 0    feeding supplement, ENSURE ENLIVE, (ENSURE ENLIVE) LIQD Take 237 mLs by mouth 3 (three) times daily between meals. Qty: 237 mL, Refills: 12    haloperidol (HALDOL) 0.5 MG tablet Take 1 tablet (0.5 mg total) by mouth every 6 (six) hours as needed for agitation. Qty: 15 tablet, Refills: 0    OLANZapine zydis (ZYPREXA) 5 MG disintegrating tablet Take 0.5 tablets (2.5 mg total) by mouth at bedtime. Qty: 20 tablet,  Refills: 0    Water For Irrigation, Sterile (FREE WATER) SOLN Take 200 mLs by mouth every 6 (six) hours.      CONTINUE these medications which have CHANGED   Details  traMADol (ULTRAM) 50 MG tablet Take 1 tablet (50 mg total) by mouth 2 (two) times daily as needed for moderate pain. Qty: 20 tablet, Refills: 0      CONTINUE these medications which have NOT CHANGED   Details  escitalopram (LEXAPRO) 10 MG tablet Take 10 mg by mouth daily.    Eyelid Cleansers (OCUSOFT EYELID CLEANSING) PADS Apply topically 2 (two) times daily.    loperamide (IMODIUM A-D) 2 MG tablet Take 2 mg by mouth 4 (four) times daily as needed for diarrhea or loose stools.    ondansetron (ZOFRAN) 4 MG tablet Take 1 tablet (4 mg total) by mouth every 6 (six) hours as needed for nausea. Qty: 20 tablet, Refills: 0    pantoprazole (PROTONIX) 40 MG tablet Take 1 tablet (40 mg total) by mouth daily. Qty: 30 tablet, Refills: 0    Propylene Glycol (SYSTANE BALANCE) 0.6 % SOLN Apply 2 drops to eye 2 (two) times daily.    Protein (PROCEL 100) POWD Take 2 scoop by mouth 2 (two) times daily.    UNABLE TO FIND Take 1 each by mouth 2 (two) times daily. Mighty shake      STOP taking these  medications     hydrochlorothiazide (HYDRODIURIL) 25 MG tablet      LORazepam (ATIVAN) 0.5 MG tablet      QUEtiapine (SEROQUEL) 25 MG tablet      traZODone (DESYREL) 50 MG tablet      antiseptic oral rinse (CPC / CETYLPYRIDINIUM CHLORIDE 0.05%) 0.05 % LIQD solution      chlorhexidine (PERIDEX) 0.12 % solution        No Known Allergies Follow-up Information    MD AT SNF Follow up.        palliative care Follow up.   Why:  Follow up with Palliative Care at facility           The results of significant diagnostics from this hospitalization (including imaging, microbiology, ancillary and laboratory) are listed below for reference.    Significant Diagnostic Studies: Dg Chest 2 View  Result Date: 05/10/2016 CLINICAL  DATA:  Fever and unresponsiveness. Possible urinary tract infection. EXAM: CHEST  2 VIEW COMPARISON:  03/29/2015 FINDINGS: Shallow inspiration. Normal heart size and pulmonary vascularity. No focal airspace disease or consolidation in the lungs. No blunting of costophrenic angles. No pneumothorax. Mediastinal contours appear intact. Degenerative changes in the spine. IMPRESSION: Shallow inspiration.  No evidence of active pulmonary disease. Electronically Signed   By: Lucienne Capers M.D.   On: 05/10/2016 01:31   Ct Head Wo Contrast  Result Date: 05/13/2016 CLINICAL DATA:  82 year old male with fever, confusion, unresponsive. Sepsis. Underlying dementia. Initial encounter. EXAM: CT HEAD WITHOUT CONTRAST TECHNIQUE: Contiguous axial images were obtained from the base of the skull through the vertex without intravenous contrast. COMPARISON:  None. FINDINGS: Brain: Generalized cerebral volume loss. No cortical encephalomalacia identified. No midline shift, ventriculomegaly, mass effect, evidence of mass lesion, intracranial hemorrhage or evidence of cortically based acute infarction. Mild for age nonspecific periventricular white matter hypodensity. Otherwise gray-white matter differentiation is within normal limits throughout the brain. Vascular: Calcified atherosclerosis at the skull base. No suspicious intracranial vascular hyperdensity. Skull: No acute osseous abnormality identified. Sinuses/Orbits: At least moderate opacification of the visible posterior ethmoid and right sphenoid sinuses. Visible frontal sinuses, bilateral tympanic cavities, and mastoids are clear. Other: Visualized orbits and scalp soft tissues are within normal limits. IMPRESSION: 1.  No acute intracranial abnormality. 2. Generalized cerebral volume loss, otherwise negative for age noncontrast CT appearance of the brain. 3. Ethmoid and sphenoid sinus inflammatory changes. Consider acute sinusitis. Electronically Signed   By: Genevie Ann M.D.    On: 05/13/2016 16:16    Microbiology: Recent Results (from the past 240 hour(s))  Culture, blood (Routine x 2)     Status: None   Collection Time: 05/10/16 12:43 AM  Result Value Ref Range Status   Specimen Description BLOOD RIGHT ARM  Final   Special Requests BOTTLES DRAWN AEROBIC AND ANAEROBIC 5ML  Final   Culture   Final    NO GROWTH 5 DAYS Performed at [facility]    Report Status 05/15/2016 FINAL  Final  Culture, blood (Routine x 2)     Status: None   Collection Time: 05/10/16 12:50 AM  Result Value Ref Range Status   Specimen Description BLOOD RIGHT FOREARM  Final   Special Requests BOTTLES DRAWN AEROBIC ONLY 8ML  Final   Culture   Final    NO GROWTH 5 DAYS Performed at [facility]    Report Status 05/15/2016 FINAL  Final  Urine culture     Status: Abnormal   Collection Time: 05/10/16  1:47 AM  Result Value Ref Range Status   Specimen Description URINE, CATHETERIZED  Final   Special Requests NONE  Final   Culture >=100,000 COLONIES/mL ESCHERICHIA COLI (A)  Final   Report Status 05/12/2016 FINAL  Final   Organism ID, Bacteria ESCHERICHIA COLI (A)  Final      Susceptibility   Escherichia coli - MIC*    AMPICILLIN <=2 SENSITIVE Sensitive     CEFAZOLIN <=4 SENSITIVE Sensitive     CEFTRIAXONE <=1 SENSITIVE Sensitive     CIPROFLOXACIN <=0.25 SENSITIVE Sensitive     GENTAMICIN <=1 SENSITIVE Sensitive     IMIPENEM <=0.25 SENSITIVE Sensitive     NITROFURANTOIN <=16 SENSITIVE Sensitive     TRIMETH/SULFA <=20 SENSITIVE Sensitive     AMPICILLIN/SULBACTAM <=2 SENSITIVE Sensitive     PIP/TAZO <=4 SENSITIVE Sensitive     Extended ESBL NEGATIVE Sensitive     * >=100,000 COLONIES/mL ESCHERICHIA COLI  Respiratory Panel by PCR     Status: Abnormal   Collection Time: 05/10/16  3:39 AM  Result Value Ref Range Status   Adenovirus NOT DETECTED NOT DETECTED Final   Coronavirus 229E NOT DETECTED NOT DETECTED Final   Coronavirus HKU1 NOT DETECTED NOT DETECTED Final    Coronavirus NL63 NOT DETECTED NOT DETECTED Final   Coronavirus OC43 NOT DETECTED NOT DETECTED Final   Metapneumovirus DETECTED (A) NOT DETECTED Final   Rhinovirus / Enterovirus NOT DETECTED NOT DETECTED Final   Influenza A NOT DETECTED NOT DETECTED Final   Influenza B NOT DETECTED NOT DETECTED Final   Parainfluenza Virus 1 NOT DETECTED NOT DETECTED Final   Parainfluenza Virus 2 NOT DETECTED NOT DETECTED Final   Parainfluenza Virus 3 NOT DETECTED NOT DETECTED Final   Parainfluenza Virus 4 NOT DETECTED NOT DETECTED Final   Respiratory Syncytial Virus NOT DETECTED NOT DETECTED Final   Bordetella pertussis NOT DETECTED NOT DETECTED Final   Chlamydophila pneumoniae NOT DETECTED NOT DETECTED Final   Mycoplasma pneumoniae NOT DETECTED NOT DETECTED Final    Comment: Performed at [facility]  MRSA PCR Screening     Status: Abnormal   Collection Time: 05/10/16  4:37 AM  Result Value Ref Range Status   MRSA by PCR POSITIVE (A) NEGATIVE Final    Comment:        The GeneXpert MRSA Assay (FDA approved for NASAL specimens only), is one component of a comprehensive MRSA colonization surveillance program. It is not intended to diagnose MRSA infection nor to guide or monitor treatment for MRSA infections. RESULT CALLED TO, READ BACK BY AND VERIFIED WITH: T RYAN AT 0616 ON 11.25.2017 BY NBROOKS      Labs: Basic Metabolic Panel:  Recent Labs Lab 05/10/16 0553 05/11/16 0612 05/12/16 0527 05/14/16 0940 05/15/16 0615 05/16/16 0717  NA 144 147* 143 138 143 139  K 3.4* 3.9 3.5 3.0* 4.4 4.4  CL 107 116* 111 105 112* 108  CO2 '29 24 28 27 27 23  ' GLUCOSE 129* 92 108* 135* 85 70  BUN 25* '20 14 10 9 11  ' CREATININE 1.36* 0.98 0.88 0.67 0.85 0.83  CALCIUM 8.0* 8.4* 8.5* 8.4* 8.3* 8.0*  MG 1.8  --   --  1.8 1.8  --    Liver Function Tests:  Recent Labs Lab 05/10/16 0043  AST 28  ALT 16*  ALKPHOS 63  BILITOT 1.1  PROT 6.8  ALBUMIN 3.6   No results for input(s): LIPASE, AMYLASE  in the last 168 hours. No results for input(s): AMMONIA in the last  168 hours. CBC:  Recent Labs Lab 05/10/16 0043 05/10/16 0553 05/11/16 0612  WBC 13.3* 13.1* 10.4  NEUTROABS 12.0*  --   --   HGB 11.2* 10.2* 10.1*  HCT 36.0* 32.6* 32.0*  MCV 102.3* 100.3* 100.3*  PLT 201 187 179   Cardiac Enzymes: No results for input(s): CKTOTAL, CKMB, CKMBINDEX, TROPONINI in the last 168 hours. BNP: BNP (last 3 results) No results for input(s): BNP in the last 8760 hours.  ProBNP (last 3 results) No results for input(s): PROBNP in the last 8760 hours.  CBG: No results for input(s): GLUCAP in the last 168 hours.     SignedIrine Seal MD.  [facility] 05/16/2016, 3:07 PM

## 2016-05-16 NOTE — Progress Notes (Addendum)
[facility] called for transport. 3 hour wait.  Faxed signed FL2 to facility.

## 2016-05-16 NOTE — Progress Notes (Signed)
[facility] Gave report to RN.  Awaiting [facility] for transport.

## 2016-05-16 NOTE — Progress Notes (Signed)
Pt discharged from the unit via [facility]. Discharge instructions were placed in the packet. No questions or concerns at this time.  Kalee Mcclenathan W Veera Stapleton, RN

## 2016-05-16 NOTE — Progress Notes (Signed)
Speech Language Pathology Treatment:    Patient Details Name: Wayne SimmeringJohn R Schroeder MRN: [id number] DOB: 1925/04/12 Today's Date: 05/16/2016 Time: [id number] SLP Time Calculation (min) (ACUTE ONLY): 33 min  Assessment / Plan / Recommendation Clinical Impression  SLP observed pt today self feeding cracker, Ensure, water and icecream with hand over hand assistance.  Pt continues with significant delay in swallow *suspect both oral transiting and pharyngeal reflex but no indications of oropharyngeal residuals.   SLP provided pt with icecream to help aid oral transiting of solids when holding excessive. No oral pocketing noted at end of snack.  Use of hand over hand assist facilitated his participation/safety..   Cough x1 noted with dry solid and pt's eyes were watering a few times during intake, however overall pt tolerated much better than during prior visit.    Pt is much more participative/willing to accept po intake today.  SlP will advance diet and monitor pt closely to tolerance/improved intake hopeful.  Chronic aspiration risk due to pt's dysphagia, dementia however with strict precautions, suspect he will tolerate overall well. Skilled intervention included determining effective compensation strategies.     HPI HPI: 82 [facility].  Pt PMH + for severe dementia, malnutrition.  Pt CXR negative for acute findings.  Swallow evaluation ordered.  Pt has been seen in the past and was placed on dys1/nectar diet in Oct 2016.       SLP Plan  Other (Comment) (recommend consult to palliative care for establishment of goals given lack of intake, mental status changes)     Recommendations  Diet recommendations: Dysphagia 3 (mechanical soft);Thin liquid Liquids provided via: Cup;Teaspoon;No straw Medication Administration: Via alternative means (pt refusing to swallow medication with RN) Supervision: Staff to assist with self feeding (pt able to hold cup with assistance with left  hand) Compensations: Minimize environmental distractions;Slow rate;Other (Comment) (oral suction if pt does not swallow) Postural Changes and/or Swallow Maneuvers: Seated upright 90 degrees;Upright 30-60 min after meal                Follow up Recommendations: None Plan: Other (Comment) (recommend consult to palliative care for establishment of goals given lack of intake, mental status changes)       GO                Wayne Burnetamara Ajah Vanhoose, MS [facility] SLP [phone]

## 2016-05-28 ENCOUNTER — Encounter (HOSPITAL_COMMUNITY): Payer: Self-pay

## 2016-05-28 ENCOUNTER — Inpatient Hospital Stay (HOSPITAL_COMMUNITY)
Admission: EM | Admit: 2016-05-28 | Discharge: 2016-05-30 | DRG: 682 | Disposition: A | Discharge status: Skilled Nursing Facility | Payer: Medicare Other | Attending: Internal Medicine | Admitting: Internal Medicine

## 2016-05-28 ENCOUNTER — Emergency Department (HOSPITAL_COMMUNITY): Payer: Medicare Other

## 2016-05-28 ENCOUNTER — Observation Stay (HOSPITAL_COMMUNITY): Payer: Medicare Other

## 2016-05-28 DIAGNOSIS — Z681 Body mass index (BMI) 19 or less, adult: Secondary | ICD-10-CM

## 2016-05-28 DIAGNOSIS — R10819 Abdominal tenderness, unspecified site: Secondary | ICD-10-CM | POA: Diagnosis present

## 2016-05-28 DIAGNOSIS — E860 Dehydration: Secondary | ICD-10-CM | POA: Diagnosis not present

## 2016-05-28 DIAGNOSIS — F418 Other specified anxiety disorders: Secondary | ICD-10-CM | POA: Diagnosis present

## 2016-05-28 DIAGNOSIS — R6251 Failure to thrive (child): Secondary | ICD-10-CM | POA: Diagnosis present

## 2016-05-28 DIAGNOSIS — Z66 Do not resuscitate: Secondary | ICD-10-CM | POA: Diagnosis present

## 2016-05-28 DIAGNOSIS — F0390 Unspecified dementia without behavioral disturbance: Secondary | ICD-10-CM | POA: Diagnosis present

## 2016-05-28 DIAGNOSIS — R339 Retention of urine, unspecified: Secondary | ICD-10-CM | POA: Diagnosis present

## 2016-05-28 DIAGNOSIS — R109 Unspecified abdominal pain: Secondary | ICD-10-CM

## 2016-05-28 DIAGNOSIS — K219 Gastro-esophageal reflux disease without esophagitis: Secondary | ICD-10-CM | POA: Diagnosis present

## 2016-05-28 DIAGNOSIS — R627 Adult failure to thrive: Secondary | ICD-10-CM | POA: Diagnosis not present

## 2016-05-28 DIAGNOSIS — I10 Essential (primary) hypertension: Secondary | ICD-10-CM | POA: Diagnosis present

## 2016-05-28 DIAGNOSIS — R64 Cachexia: Secondary | ICD-10-CM | POA: Diagnosis present

## 2016-05-28 DIAGNOSIS — K4090 Unilateral inguinal hernia, without obstruction or gangrene, not specified as recurrent: Secondary | ICD-10-CM

## 2016-05-28 DIAGNOSIS — Z96641 Presence of right artificial hip joint: Secondary | ICD-10-CM | POA: Diagnosis present

## 2016-05-28 DIAGNOSIS — N179 Acute kidney failure, unspecified: Secondary | ICD-10-CM | POA: Diagnosis not present

## 2016-05-28 DIAGNOSIS — F03C0 Unspecified dementia, severe, without behavioral disturbance, psychotic disturbance, mood disturbance, and anxiety: Secondary | ICD-10-CM | POA: Diagnosis present

## 2016-05-28 DIAGNOSIS — Z79899 Other long term (current) drug therapy: Secondary | ICD-10-CM

## 2016-05-28 DIAGNOSIS — E43 Unspecified severe protein-calorie malnutrition: Secondary | ICD-10-CM | POA: Diagnosis present

## 2016-05-28 LAB — APTT: APTT: 36 s (ref 24–36)

## 2016-05-28 LAB — URINALYSIS, ROUTINE W REFLEX MICROSCOPIC
Bilirubin Urine: NEGATIVE
GLUCOSE, UA: NEGATIVE mg/dL
HGB URINE DIPSTICK: NEGATIVE
Ketones, ur: 20 mg/dL — AB
LEUKOCYTES UA: NEGATIVE
Nitrite: NEGATIVE
Protein, ur: NEGATIVE mg/dL
SPECIFIC GRAVITY, URINE: 1.019 (ref 1.005–1.030)
pH: 5 (ref 5.0–8.0)

## 2016-05-28 LAB — COMPREHENSIVE METABOLIC PANEL
ALT: 22 U/L (ref 17–63)
ANION GAP: 10 (ref 5–15)
AST: 54 U/L — AB (ref 15–41)
Albumin: 3.7 g/dL (ref 3.5–5.0)
Alkaline Phosphatase: 65 U/L (ref 38–126)
BUN: 29 mg/dL — AB (ref 6–20)
CHLORIDE: 99 mmol/L — AB (ref 101–111)
CO2: 32 mmol/L (ref 22–32)
Calcium: 9.4 mg/dL (ref 8.9–10.3)
Creatinine, Ser: 1.38 mg/dL — ABNORMAL HIGH (ref 0.61–1.24)
GFR, EST AFRICAN AMERICAN: 51 mL/min — AB (ref >60)
GFR, EST NON AFRICAN AMERICAN: 44 mL/min — AB (ref >60)
Glucose, Bld: 105 mg/dL — ABNORMAL HIGH (ref 65–99)
POTASSIUM: 4 mmol/L (ref 3.5–5.1)
Sodium: 141 mmol/L (ref 135–145)
Total Bilirubin: 0.7 mg/dL (ref 0.3–1.2)
Total Protein: 7.4 g/dL (ref 6.5–8.1)

## 2016-05-28 LAB — CBC WITH DIFFERENTIAL/PLATELET
Basophils Absolute: 0 10*3/uL (ref 0.0–0.1)
Basophils Relative: 1 %
Eosinophils Absolute: 0.1 10*3/uL (ref 0.0–0.7)
Eosinophils Relative: 2 %
HCT: 36.9 % — ABNORMAL LOW (ref 39.0–52.0)
HEMOGLOBIN: 11.5 g/dL — AB (ref 13.0–17.0)
LYMPHS ABS: 1.6 10*3/uL (ref 0.7–4.0)
LYMPHS PCT: 21 %
MCH: 31.7 pg (ref 26.0–34.0)
MCHC: 31.2 g/dL (ref 30.0–36.0)
MCV: 101.7 fL — AB (ref 78.0–100.0)
Monocytes Absolute: 0.9 10*3/uL (ref 0.1–1.0)
Monocytes Relative: 12 %
NEUTROS ABS: 4.9 10*3/uL (ref 1.7–7.7)
NEUTROS PCT: 64 %
Platelets: 406 10*3/uL — ABNORMAL HIGH (ref 150–400)
RBC: 3.63 MIL/uL — AB (ref 4.22–5.81)
RDW: 14.1 % (ref 11.5–15.5)
WBC: 7.6 10*3/uL (ref 4.0–10.5)

## 2016-05-28 LAB — PROTIME-INR
INR: 1.03
PROTHROMBIN TIME: 13.5 s (ref 11.4–15.2)

## 2016-05-28 LAB — LIPASE, BLOOD: Lipase: 25 U/L (ref 11–51)

## 2016-05-28 MED ORDER — ONDANSETRON HCL 4 MG/2ML IJ SOLN: 4.0000 mg | Freq: Four times a day (QID) | INTRAMUSCULAR | Status: DC | PRN

## 2016-05-28 MED ORDER — TRAMADOL HCL 50 MG PO TABS
50.0000 mg | ORAL_TABLET | Freq: Two times a day (BID) | ORAL | Status: DC | PRN
Start: 2016-05-28 — End: 2016-05-30

## 2016-05-28 MED ORDER — OCUSOFT EYELID CLEANSING EX PADS: MEDICATED_PAD | Freq: Two times a day (BID) | CUTANEOUS | Status: DC

## 2016-05-28 MED ORDER — THIAMINE HCL 100 MG/ML IJ SOLN
100.0000 mg | Freq: Once | INTRAMUSCULAR | Status: AC
  Administered 2016-05-28: 100 mg via INTRAVENOUS
  Filled 2016-05-28: qty 2

## 2016-05-28 MED ORDER — SODIUM CHLORIDE 0.9 % IV BOLUS (SEPSIS)
500.0000 mL | Freq: Once | INTRAVENOUS | Status: AC
  Administered 2016-05-28: 500 mL via INTRAVENOUS

## 2016-05-28 MED ORDER — HALOPERIDOL 0.5 MG PO TABS
0.5000 mg | ORAL_TABLET | Freq: Four times a day (QID) | ORAL | Status: DC | PRN
  Administered 2016-05-28 – 2016-05-29 (×2): 0.5 mg via ORAL
  Filled 2016-05-28 (×3): qty 1

## 2016-05-28 MED ORDER — ESCITALOPRAM OXALATE 10 MG PO TABS
10.0000 mg | ORAL_TABLET | Freq: Every day | ORAL | Status: DC
  Administered 2016-05-29 – 2016-05-30 (×2): 10 mg via ORAL
  Filled 2016-05-28 (×2): qty 1

## 2016-05-28 MED ORDER — FREE WATER: 200.0000 mL | Freq: Four times a day (QID) | Status: DC

## 2016-05-28 MED ORDER — LORAZEPAM 0.5 MG PO TABS
0.5000 mg | ORAL_TABLET | Freq: Two times a day (BID) | ORAL | Status: DC | PRN
  Administered 2016-05-29 – 2016-05-30 (×2): 0.5 mg via ORAL
  Filled 2016-05-28 (×2): qty 1

## 2016-05-28 MED ORDER — LOPERAMIDE HCL 2 MG PO CAPS: 2.0000 mg | ORAL_CAPSULE | Freq: Four times a day (QID) | ORAL | Status: DC | PRN

## 2016-05-28 MED ORDER — ENSURE ENLIVE PO LIQD
237.0000 mL | Freq: Three times a day (TID) | ORAL | Status: DC
  Administered 2016-05-29: 237 mL via ORAL

## 2016-05-28 MED ORDER — OLANZAPINE 5 MG PO TBDP
2.5000 mg | ORAL_TABLET | Freq: Every day | ORAL | Status: DC
  Administered 2016-05-28 – 2016-05-29 (×2): 2.5 mg via ORAL
  Filled 2016-05-28 (×3): qty 0.5

## 2016-05-28 MED ORDER — ZOLPIDEM TARTRATE 5 MG PO TABS: 5.0000 mg | ORAL_TABLET | Freq: Every evening | ORAL | Status: DC | PRN

## 2016-05-28 MED ORDER — ONDANSETRON HCL 4 MG PO TABS
4.0000 mg | ORAL_TABLET | Freq: Four times a day (QID) | ORAL | Status: DC | PRN
Start: 2016-05-28 — End: 2016-05-30

## 2016-05-28 MED ORDER — SODIUM CHLORIDE 0.9 % IV SOLN
Freq: Once | INTRAVENOUS | Status: AC
  Administered 2016-05-28: 125 mL/h via INTRAVENOUS

## 2016-05-28 MED ORDER — QUETIAPINE FUMARATE 25 MG PO TABS
12.5000 mg | ORAL_TABLET | Freq: Every day | ORAL | Status: DC
  Administered 2016-05-30: 12.5 mg via ORAL
  Filled 2016-05-28 (×2): qty 0.5

## 2016-05-28 MED ORDER — POLYVINYL ALCOHOL 1.4 % OP SOLN
2.0000 [drp] | Freq: Two times a day (BID) | OPHTHALMIC | Status: DC
  Administered 2016-05-28 – 2016-05-30 (×3): 2 [drp] via OPHTHALMIC
  Filled 2016-05-28: qty 15

## 2016-05-28 MED ORDER — IOPAMIDOL (ISOVUE-300) INJECTION 61%
INTRAVENOUS | Status: AC
  Filled 2016-05-28: qty 30

## 2016-05-28 MED ORDER — LORAZEPAM 2 MG/ML IJ SOLN
0.5000 mg | Freq: Once | INTRAMUSCULAR | Status: AC
  Administered 2016-05-28: 0.5 mg via INTRAVENOUS
  Filled 2016-05-28: qty 1

## 2016-05-28 MED ORDER — PRO-STAT SUGAR FREE PO LIQD
60.0000 mL | Freq: Two times a day (BID) | ORAL | Status: DC
  Administered 2016-05-28 – 2016-05-30 (×4): 60 mL via ORAL
  Filled 2016-05-28 (×4): qty 60

## 2016-05-28 MED ORDER — ONDANSETRON HCL 4 MG PO TABS: 4.0000 mg | ORAL_TABLET | Freq: Four times a day (QID) | ORAL | Status: DC | PRN

## 2016-05-28 MED ORDER — ACETAMINOPHEN 325 MG PO TABS: 650.0000 mg | ORAL_TABLET | Freq: Four times a day (QID) | ORAL | Status: DC | PRN

## 2016-05-28 MED ORDER — TRAZODONE HCL 50 MG PO TABS
50.0000 mg | ORAL_TABLET | Freq: Every day | ORAL | Status: DC
  Administered 2016-05-28 – 2016-05-29 (×2): 50 mg via ORAL
  Filled 2016-05-28 (×2): qty 1

## 2016-05-28 MED ORDER — MORPHINE SULFATE (PF) 2 MG/ML IV SOLN: 1.0000 mg | INTRAVENOUS | Status: DC | PRN

## 2016-05-28 MED ORDER — ACETAMINOPHEN 650 MG RE SUPP
650.0000 mg | Freq: Four times a day (QID) | RECTAL | Status: DC | PRN
Start: 2016-05-28 — End: 2016-05-30

## 2016-05-28 MED ORDER — PANTOPRAZOLE SODIUM 40 MG PO TBEC
40.0000 mg | DELAYED_RELEASE_TABLET | Freq: Every day | ORAL | Status: DC
  Administered 2016-05-29 – 2016-05-30 (×2): 40 mg via ORAL
  Filled 2016-05-28 (×2): qty 1

## 2016-05-28 MED ORDER — SODIUM CHLORIDE 0.9 % IV SOLN
INTRAVENOUS | Status: DC
  Administered 2016-05-28 – 2016-05-29 (×3): via INTRAVENOUS
  Administered 2016-05-30: 1000 mL via INTRAVENOUS

## 2016-05-28 MED ORDER — ENOXAPARIN SODIUM 40 MG/0.4ML ~~LOC~~ SOLN
40.0000 mg | SUBCUTANEOUS | Status: DC
  Filled 2016-05-28: qty 0.4

## 2016-05-28 MED ORDER — IOPAMIDOL (ISOVUE-300) INJECTION 61%: 30.0000 mL | Freq: Once | INTRAVENOUS | Status: DC | PRN

## 2016-05-28 NOTE — H&P (Addendum)
History and Physical    Wayne Schroeder:[id number] DOB: [birth year] DOA: 05/28/2016  Referring MD/NP/PA:   PCP: Martha Clan, MD   Patient coming from:  The patient is coming from home.  At baseline, pt is dependent for most of ADL.   Chief Complaint: Decreased oral intake, failure to thrive   HPI: Wayne Schroeder is a 82 y.o. male with medical history significant of dementia, hypertension, GERD, depression, anxiety, right inguinal hernia, who presents with decreased oral intake, failure to thrive.  Per family, patient has been having decreased oral intake and failure to thrive in the past several days. He seems to be dehydrated. He had soft blood pressure with SBP at 90s. Per family, patient does not seem to have nausea, vomiting, abdominal pain or diarrhea, but on my physical examination, patient has abdominal guarding and abdominal tenderness. Per family, patient does not have fever, chills, chest pain or shortness of breath. He moves all extremities.  ED Course: pt was found to have AKI with Cre 1.38, WBC 7.6, lipase 25, negative urinalysis, temperature normal, no tachycardia, blood pressure 92/78. CT-abdomen/pelvis showed right inguinal hernia containing a loop of bowel with fecalized contents, mildly dilated. Visualization is partially obscured by streak artifact from a right total hip arthroplasty, but may indicate obstruction or incarceration. Pt is place on med-surg bed for obs (bed was requested before getting the CT-scan result). General surgeon, Dr. Carolynne Edouard was consulted.  Review of Systems: Could not be reviewed accurately due to dementia.  Allergy: No Known Allergies  Past Medical History:  Diagnosis Date  . Dementia     Past Surgical History:  Procedure Laterality Date  . BACK SURGERY    . TOTAL HIP ARTHROPLASTY Right     Social History:  reports that he has never smoked. He has never used smokeless tobacco. He reports that he does not drink alcohol or use  drugs.  Family History:  Family History  Problem Relation Age of Onset  . Dementia Other      Prior to Admission medications   Medication Sig Start Date End Date Taking? Authorizing Provider  escitalopram (LEXAPRO) 10 MG tablet Take 10 mg by mouth daily.   Yes Historical Provider, MD  Eyelid Cleansers (OCUSOFT EYELID CLEANSING) PADS Apply topically 2 (two) times daily.   Yes Historical Provider, MD  haloperidol (HALDOL) 0.5 MG tablet Take 1 tablet (0.5 mg total) by mouth every 6 (six) hours as needed for agitation. 05/16/16  Yes Rodolph Bong, MD  hydrochlorothiazide (HYDRODIURIL) 25 MG tablet Take 25 mg by mouth daily. 05/26/16  Yes Historical Provider, MD  loperamide (IMODIUM A-D) 2 MG tablet Take 2 mg by mouth 4 (four) times daily as needed for diarrhea or loose stools.   Yes Historical Provider, MD  LORazepam (ATIVAN) 0.5 MG tablet Take 0.5 mg by mouth 2 (two) times daily as needed for anxiety. 04/16/16  Yes Historical Provider, MD  OLANZapine zydis (ZYPREXA) 5 MG disintegrating tablet Take 0.5 tablets (2.5 mg total) by mouth at bedtime. Patient taking differently: Take 5 mg by mouth at bedtime.  05/16/16  Yes Rodolph Bong, MD  ondansetron (ZOFRAN) 4 MG tablet Take 1 tablet (4 mg total) by mouth every 6 (six) hours as needed for nausea. 04/01/15  Yes Alison Murray, MD  pantoprazole (PROTONIX) 40 MG tablet Take 1 tablet (40 mg total) by mouth daily. 04/01/15  Yes Alison Murray, MD  Propylene Glycol (SYSTANE BALANCE) 0.6 % SOLN Apply 2 drops to  eye 2 (two) times daily.   Yes Historical Provider, MD  Protein (PROCEL 100) POWD Take 2 scoop by mouth 2 (two) times daily.   Yes Historical Provider, MD  QUEtiapine (SEROQUEL) 25 MG tablet Take 12.5 mg by mouth daily. 05/04/16  Yes Historical Provider, MD  traMADol (ULTRAM) 50 MG tablet Take 1 tablet (50 mg total) by mouth 2 (two) times daily as needed for moderate pain. 05/16/16  Yes Rodolph Bong, MD  traZODone (DESYREL) 50 MG tablet  Take 50 mg by mouth at bedtime. 04/28/16  Yes Historical Provider, MD  UNABLE TO FIND Take 1 each by mouth 2 (two) times daily. Mighty shake   Yes Historical Provider, MD  feeding supplement, ENSURE ENLIVE, (ENSURE ENLIVE) LIQD Take 237 mLs by mouth 3 (three) times daily between meals. 05/16/16   Rodolph Bong, MD  Water For Irrigation, Sterile (FREE WATER) SOLN Take 200 mLs by mouth every 6 (six) hours. 05/16/16   Rodolph Bong, MD    Physical Exam: Vitals:   05/28/16 1720 05/28/16 1803 05/28/16 2024 05/28/16 2137  BP: 121/96 131/73 149/94 (!) 145/59  Pulse: 91 76 83 71  Resp: 18 18 19 20   Temp:    98 F (36.7 C)  TempSrc:      SpO2: 97% 96% 95% 97%   General: Not in acute distress HEENT:       Eyes: PERRL, EOMI, no scleral icterus.       ENT: No discharge from the ears and nose, no pharynx injection, no tonsillar enlargement.        Neck: No JVD, no bruit, no mass felt. Heme: No neck lymph node enlargement. Cardiac: S1/S2, RRR, No murmurs, No gallops or rubs. Respiratory: No rales, wheezing, rhonchi or rubs. GI: Soft, nondistended, has abd guarding and diffused tenderness, no organomegaly, BS present. GU: No hematuria Ext: No pitting leg edema bilaterally. 2+DP/PT pulse bilaterally. Musculoskeletal: No joint deformities, No joint redness or warmth, no limitation of ROM in spin. Skin: No rashes.  Neuro: Alert, not oriented X3, cranial nerves II-XII grossly intact, moves all extremities normally. Psych: Not reviewed due to dementia.  Labs on Admission: I have personally reviewed following labs and imaging studies  CBC:  Recent Labs Lab 05/28/16 1526  WBC 7.6  NEUTROABS 4.9  HGB 11.5*  HCT 36.9*  MCV 101.7*  PLT 406*   Basic Metabolic Panel:  Recent Labs Lab 05/28/16 1526  NA 141  K 4.0  CL 99*  CO2 32  GLUCOSE 105*  BUN 29*  CREATININE 1.38*  CALCIUM 9.4   GFR: CrCl cannot be calculated (Unknown ideal weight.). Liver Function Tests:  Recent  Labs Lab 05/28/16 1526  AST 54*  ALT 22  ALKPHOS 65  BILITOT 0.7  PROT 7.4  ALBUMIN 3.7    Recent Labs Lab 05/28/16 1526  LIPASE 25   No results for input(s): AMMONIA in the last 168 hours. Coagulation Profile: No results for input(s): INR, PROTIME in the last 168 hours. Cardiac Enzymes: No results for input(s): CKTOTAL, CKMB, CKMBINDEX, TROPONINI in the last 168 hours. BNP (last 3 results) No results for input(s): PROBNP in the last 8760 hours. HbA1C: No results for input(s): HGBA1C in the last 72 hours. CBG: No results for input(s): GLUCAP in the last 168 hours. Lipid Profile: No results for input(s): CHOL, HDL, LDLCALC, TRIG, CHOLHDL, LDLDIRECT in the last 72 hours. Thyroid Function Tests: No results for input(s): TSH, T4TOTAL, FREET4, T3FREE, THYROIDAB in the last 72 hours.  Anemia Panel: No results for input(s): VITAMINB12, FOLATE, FERRITIN, TIBC, IRON, RETICCTPCT in the last 72 hours. Urine analysis:    Component Value Date/Time   COLORURINE YELLOW 05/28/2016 1530   APPEARANCEUR CLEAR 05/28/2016 1530   LABSPEC 1.019 05/28/2016 1530   PHURINE 5.0 05/28/2016 1530   GLUCOSEU NEGATIVE 05/28/2016 1530   HGBUR NEGATIVE 05/28/2016 1530   BILIRUBINUR NEGATIVE 05/28/2016 1530   KETONESUR 20 (A) 05/28/2016 1530   PROTEINUR NEGATIVE 05/28/2016 1530   UROBILINOGEN 0.2 03/28/2015 2213   NITRITE NEGATIVE 05/28/2016 1530   LEUKOCYTESUR NEGATIVE 05/28/2016 1530   Sepsis Labs: @LABRCNTIP (procalcitonin:4,lacticidven:4) )No results found for this or any previous visit (from the past 240 hour(s)).   Radiological Exams on Admission: Ct Abdomen Pelvis Wo Contrast  Result Date: 05/28/2016 CLINICAL DATA:  Hypotension and decreased p.o. intake EXAM: CT ABDOMEN AND PELVIS WITHOUT CONTRAST TECHNIQUE: Multidetector CT imaging of the abdomen and pelvis was performed following the standard protocol without IV contrast. COMPARISON:  Abdominal radiograph 05/28/2016 FINDINGS: Lower  chest: No pulmonary nodules. No visible pleural or pericardial effusion. Hepatobiliary: Normal hepatic size and contours. No perihepatic ascites. No intra- or extrahepatic biliary dilatation. Multiple noncalcified gallstones. Pancreas: Normal pancreatic contours. No peripancreatic fluid collection or pancreatic ductal dilatation. Spleen: Normal. Adrenals/Urinary Tract: Normal adrenal glands. No hydronephrosis or nephrolithiasis. No abnormal perinephric stranding. No ureteral obstruction. Stomach/Bowel: The right inguinal canal is partially obscured by streak artifact from right hip arthroplasty, but there is a right inguinal hernia contains a portion of bowel with internal fecalized contents with the lumen measuring up to 3 cm. No dilated small bowel. No intra- abdominal fluid collection. No portal venous gas. The appendix is not visualized. Vascular/Lymphatic: There is atherosclerotic calcification of the non aneurysmal abdominal aorta. No abdominal or pelvic adenopathy. Reproductive: Normal prostate and seminal vesicles. Musculoskeletal: Right total hip arthroplasty. Bones are osteopenic. Multilevel lumbar facet arthrosis. No bony spinal canal stenosis. No lytic or blastic osseous lesions. Normal visualized extrathoracic and extraperitoneal soft tissues. Other: No contributory non-categorized findings. IMPRESSION: 1. Right inguinal hernia containing a loop of bowel with fecalized contents, mildly dilated. Visualization is partially obscured by streak artifact from a right total hip arthroplasty, but this may indicate obstruction or incarceration. 2. Cholelithiasis without evidence of acute cholecystitis. 3. Aortic atherosclerosis. Electronically Signed   By: Deatra RobinsonKevin  Herman M.D.   On: 05/28/2016 21:22   Dg Abd Acute W/chest  Result Date: 05/28/2016 CLINICAL DATA:  Hypotension decreased appetite EXAM: DG ABDOMEN ACUTE W/ 1V CHEST COMPARISON:  05/10/2016 FINDINGS: Single-view chest demonstrates hyperinflation.  There is no acute consolidation or effusion. Normal heart size. Supine and decubitus views of the abdomen were obtained. Bowel-gas pattern nonobstructed. Large amount of stool in the distal colon and rectum. No free air on decubitus view. No suspicious calcifications. Partially visualized right hip replacement. IMPRESSION: 1. No radiographic evidence for acute cardiopulmonary abnormality 2. Nonobstructed gas pattern Electronically Signed   By: Jasmine PangKim  Fujinaga M.D.   On: 05/28/2016 18:15     EKG:  Not done in ED, will get one.   Assessment/Plan Principal Problem:   Abdominal tenderness Active Problems:   Protein-calorie malnutrition, severe   Acute renal failure (ARF) (HCC)   Essential hypertension   Dehydration   GERD (gastroesophageal reflux disease)   Depression with anxiety   Failure to thrive (0-17)   Right inguinal hernia   Abdominal tenderness and right inguinal hernia: On physical examination, patient has a abdominal guarding and abdominal tenderness. This is most likely due to possible  obstruction or incarceration of a right inguinal hernia as shown by CT scan. Patient is not septic clinically. Hemodynamically stable. Gen. surgeon, Dr. Carolynne Edouardoth was consulted.  -will place on med-surg bed for obs -NPO  -morphine prn pain -Prn Zofran prn nausea  -IVF: NS 500 cc and then 125 cc/h -INR/PTT/type & screen -Follow-up general surgeons recommendation  AKI: Cre 1.38. Likely due to prerenal secondary to dehydration and continuation of diruetics - IVF as above - Check FeNa - Follow up renal function by BMP - Hold Diuretics, HCTZ  Protein-calorie malnutrition, severe: -consult to nutrition  Essential hypertension: bp is soft -Hold HCTZ  GERD: -Protonix  Depression and anxiety: Stable, no suicidal or homicidal ideations. -Continue home medications: Lexapro, Seroquel, when necessary Ativan, olanzapine  Failure to thrive: Multifactorial etiology, including possible right inguinal  hernia incarceration, decreased oral intake, protein calorie malnutrition -Treat underlying issues as above -Nutrition consult   DVT ppx: SCD Code Status: Full code Family Communication: Yes, patient's wife and daughter at bed side Disposition Plan:  Anticipate discharge back to previous home environment Consults called: General surgeon, Dr. Carolynne Edouardoth  Admission status: medical floor/obs  Date of Service 05/28/2016    Lorretta HarpIU, Hiroshi Krummel [facility] Pager [phone]  If 7PM-7AM, please contact night-coverage [email or web address] Password [email or web address] 05/28/2016, 10:28 PM

## 2016-05-28 NOTE — ED Provider Notes (Signed)
Level V caveat dementia. History from memory care unit as the patient has had diminished appetite . On exam patient is chronically ill-appearing cachectic. Appears in no distress   Doug SouSam Eldwin Volkov, MD 05/28/16 [phone]

## 2016-05-28 NOTE — ED Provider Notes (Signed)
[facility]-EMERGENCY DEPT Provider Note   CSN: [id number] Arrival date & time: 05/28/16  1340     History   Chief Complaint Chief Complaint  Patient presents with  . decreased appetite    HPI TITAN KARNER is a 82 y.o. male.  HPI   Pt with hx dementia with recent admission for sepsis for UTI (d/c Dec 1) sent in from facility for decreased PO intake, slumping forward in wheelchair, hypotension.    Level V caveat for dementia   Past Medical History:  Diagnosis Date  . Dementia     Patient Active Problem List   Diagnosis Date Noted  . Dehydration 05/28/2016  . Altered mental status   . Fever   . Goals of care, counseling/discussion   . Acute kidney injury (nontraumatic) (HCC)   . Urinary tract infection without hematuria   . Acute cystitis without hematuria 05/10/2016  . Essential hypertension 05/10/2016  . Pressure injury of skin 05/10/2016  . Palliative care encounter   . Hypokalemia 03/31/2015  . Acute renal failure (ARF) (HCC)   . Urinary tract infectious disease   . Protein-calorie malnutrition, severe 03/30/2015  . Sepsis, unspecified organism (HCC) 03/29/2015  . Acute hypernatremia 03/29/2015  . Elevated troponin 03/28/2015  . UTI (urinary tract infection) 03/28/2015  . Leukocytosis 03/25/2015  . ARF (acute renal failure) (HCC) 03/25/2015  . Macrocytic anemia 03/24/2015  . Unspecified dementia with behavioral disturbance 03/22/2015    Past Surgical History:  Procedure Laterality Date  . BACK SURGERY    . TOTAL HIP ARTHROPLASTY Right        Home Medications    Prior to Admission medications   Medication Sig Start Date End Date Taking? Authorizing Provider  escitalopram (LEXAPRO) 10 MG tablet Take 10 mg by mouth daily.   Yes Historical Provider, MD  Eyelid Cleansers (OCUSOFT EYELID CLEANSING) PADS Apply topically 2 (two) times daily.   Yes Historical Provider, MD  haloperidol (HALDOL) 0.5 MG tablet Take 1 tablet (0.5 mg total) by mouth every 6  (six) hours as needed for agitation. 05/16/16  Yes Rodolph Bong, MD  hydrochlorothiazide (HYDRODIURIL) 25 MG tablet Take 25 mg by mouth daily. 05/26/16  Yes Historical Provider, MD  loperamide (IMODIUM A-D) 2 MG tablet Take 2 mg by mouth 4 (four) times daily as needed for diarrhea or loose stools.   Yes Historical Provider, MD  LORazepam (ATIVAN) 0.5 MG tablet Take 0.5 mg by mouth 2 (two) times daily as needed for anxiety. 04/16/16  Yes Historical Provider, MD  OLANZapine zydis (ZYPREXA) 5 MG disintegrating tablet Take 0.5 tablets (2.5 mg total) by mouth at bedtime. Patient taking differently: Take 5 mg by mouth at bedtime.  05/16/16  Yes Rodolph Bong, MD  ondansetron (ZOFRAN) 4 MG tablet Take 1 tablet (4 mg total) by mouth every 6 (six) hours as needed for nausea. 04/01/15  Yes Alison Murray, MD  pantoprazole (PROTONIX) 40 MG tablet Take 1 tablet (40 mg total) by mouth daily. 04/01/15  Yes Alison Murray, MD  Propylene Glycol (SYSTANE BALANCE) 0.6 % SOLN Apply 2 drops to eye 2 (two) times daily.   Yes Historical Provider, MD  Protein (PROCEL 100) POWD Take 2 scoop by mouth 2 (two) times daily.   Yes Historical Provider, MD  QUEtiapine (SEROQUEL) 25 MG tablet Take 12.5 mg by mouth daily. 05/04/16  Yes Historical Provider, MD  traMADol (ULTRAM) 50 MG tablet Take 1 tablet (50 mg total) by mouth 2 (two) times daily as  needed for moderate pain. 05/16/16  Yes Rodolph Bonganiel V Thompson, MD  traZODone (DESYREL) 50 MG tablet Take 50 mg by mouth at bedtime. 04/28/16  Yes Historical Provider, MD  UNABLE TO FIND Take 1 each by mouth 2 (two) times daily. Mighty shake   Yes Historical Provider, MD  feeding supplement, ENSURE ENLIVE, (ENSURE ENLIVE) LIQD Take 237 mLs by mouth 3 (three) times daily between meals. 05/16/16   Rodolph Bonganiel V Thompson, MD  Water For Irrigation, Sterile (FREE WATER) SOLN Take 200 mLs by mouth every 6 (six) hours. 05/16/16   Rodolph Bonganiel V Thompson, MD    Family History Family History  Problem  Relation Age of Onset  . Dementia Other     Social History Social History  Substance Use Topics  . Smoking status: Never Smoker  . Smokeless tobacco: Never Used  . Alcohol use No     Allergies   Patient has no known allergies.   Review of Systems Review of Systems  Unable to perform ROS: Dementia     Physical Exam Updated Vital Signs BP 131/73   Pulse 76   Temp 98.7 F (37.1 C) (Oral)   Resp 18   SpO2 96%   Physical Exam  Constitutional: He appears well-developed and well-nourished. No distress.  HENT:  Head: Normocephalic and atraumatic.  Ectropion   Neck: Neck supple.  Cardiovascular: Normal rate and regular rhythm.   Pulmonary/Chest: Effort normal and breath sounds normal. No respiratory distress. He has no wheezes. He has no rales.  Abdominal: Soft. He exhibits no distension. There is no rebound.  Neurological: He is alert. He exhibits normal muscle tone.  Pt does not follow commands.  He is resistant to exam.    Skin: He is not diaphoretic.  Nursing note and vitals reviewed.    ED Treatments / Results  Labs (all labs ordered are listed, but only abnormal results are displayed) Labs Reviewed  COMPREHENSIVE METABOLIC PANEL - Abnormal; Notable for the following:       Result Value   Chloride 99 (*)    Glucose, Bld 105 (*)    BUN 29 (*)    Creatinine, Ser 1.38 (*)    AST 54 (*)    GFR calc non Af Amer 44 (*)    GFR calc Af Amer 51 (*)    All other components within normal limits  CBC WITH DIFFERENTIAL/PLATELET - Abnormal; Notable for the following:    RBC 3.63 (*)    Hemoglobin 11.5 (*)    HCT 36.9 (*)    MCV 101.7 (*)    Platelets 406 (*)    All other components within normal limits  URINALYSIS, ROUTINE W REFLEX MICROSCOPIC - Abnormal; Notable for the following:    Ketones, ur 20 (*)    All other components within normal limits  URINE CULTURE    EKG  EKG Interpretation None       Radiology Dg Abd Acute W/chest  Result Date:  05/28/2016 CLINICAL DATA:  Hypotension decreased appetite EXAM: DG ABDOMEN ACUTE W/ 1V CHEST COMPARISON:  05/10/2016 FINDINGS: Single-view chest demonstrates hyperinflation. There is no acute consolidation or effusion. Normal heart size. Supine and decubitus views of the abdomen were obtained. Bowel-gas pattern nonobstructed. Large amount of stool in the distal colon and rectum. No free air on decubitus view. No suspicious calcifications. Partially visualized right hip replacement. IMPRESSION: 1. No radiographic evidence for acute cardiopulmonary abnormality 2. Nonobstructed gas pattern Electronically Signed   By: Adrian ProwsKim  Fujinaga M.D.  On: 05/28/2016 18:15    Procedures Procedures (including critical care time)  Medications Ordered in ED Medications  sodium chloride 0.9 % bolus 500 mL (0 mLs Intravenous Stopped 05/28/16 1820)  thiamine (B-1) injection 100 mg (100 mg Intravenous Given 05/28/16 1719)  0.9 %  sodium chloride infusion (125 mL/hr Intravenous New Bag/Given 05/28/16 1846)     Initial Impression / Assessment and Plan / ED Course  I have reviewed the triage vital signs and the nursing notes.  Pertinent labs & imaging results that were available during my care of the patient were reviewed by me and considered in my medical decision making (see chart for details).  Clinical Course as of May 28 1940  Wed May 28, 2016  1802 I called patient's spouse and was unable to get through - line rings through as busy.  I did speak with patient's daughter Efrain SellaMarie Branson who will try to contact her mother and will likely come to ED.    [EW]  1920 Pt admitted to Dr Clyde LundborgNiu.  [EW]    Clinical Course User Index [EW] Trixie DredgeEmily Berdell Hostetler, PA-C   [facility] and [facility] reports that pt has not been eating well for an unknown period of time, was recent discharged from hospital with new pureed diet.  Has been sleeping a lot and having low blood pressures.  Doctor saw  him today and was concerned because he was recently septic and wanted him checked out.     Afebrile patient with dementia with recent admission for urosepsis (Discharged Dec 1) sent in by facility for decreased PO intake, slumping in his wheelchair, sleeping excessively, low blood pressures.  Pt does appear to be dehydrated, creatinine has increased from 0.8 to 1.3 since discharge.  Pt does not appear to have infection.  Diet recently changed to pureed food, unclear if this is causing him to not eat as much.  Family informed of patient's care in the Emergency Department and they have come to be a part of the discussion.  Daughter does want the patient admitted for IV hydration.  She and her mother have discussed hospice and palliative care but have not made any arrangements.  Patient admitted to [facility], Dr Clyde LundborgNiu accepting.    Final Clinical Impressions(s) / ED Diagnoses   Final diagnoses:  Dehydration  AKI (acute kidney injury) [facility](HCC)    New Prescriptions New Prescriptions   No medications on file     Trixie Dredgemily Charnise Lovan, PA-C 05/28/16 1941    Doug SouSam Jacubowitz, MD 05/28/16 [phone]

## 2016-05-28 NOTE — ED Triage Notes (Signed)
[facility] EMS [facility], not eating as much since changed to purred diet over the past couple days.  EMS also reports that where originally called out for hypotension. Patient was systolic 96 initially then repositioned and came up to 110.

## 2016-05-28 NOTE — Consult Note (Signed)
Reason for Consult:CT Referring Physician: Dr. Thornton Park is an 82 y.o. male.  HPI: The patient is a 82yo wm from a nursing home with severe dementia on palliative care. He is failing to thrive. He was sent to [facility] hospital and Ct was obtained that showed a right inguinal hernia. Surgery was consulted.  Past Medical History:  Diagnosis Date  . Dementia     Past Surgical History:  Procedure Laterality Date  . BACK SURGERY    . TOTAL HIP ARTHROPLASTY Right     Family History  Problem Relation Age of Onset  . Dementia Other     Social History:  reports that he has never smoked. He has never used smokeless tobacco. He reports that he does not drink alcohol or use drugs.  Allergies: No Known Allergies  Medications: I have reviewed the patient's current medications.  Results for orders placed or performed during the hospital encounter of 05/28/16 (from the past 48 hour(s))  Comprehensive metabolic panel     Status: Abnormal   Collection Time: 05/28/16  3:26 PM  Result Value Ref Range   Sodium 141 135 - 145 mmol/L   Potassium 4.0 3.5 - 5.1 mmol/L   Chloride 99 (L) 101 - 111 mmol/L   CO2 32 22 - 32 mmol/L   Glucose, Bld 105 (H) 65 - 99 mg/dL   BUN 29 (H) 6 - 20 mg/dL   Creatinine, Ser 1.38 (H) 0.61 - 1.24 mg/dL   Calcium 9.4 8.9 - 10.3 mg/dL   Total Protein 7.4 6.5 - 8.1 g/dL   Albumin 3.7 3.5 - 5.0 g/dL   AST 54 (H) 15 - 41 U/L   ALT 22 17 - 63 U/L   Alkaline Phosphatase 65 38 - 126 U/L   Total Bilirubin 0.7 0.3 - 1.2 mg/dL   GFR calc non Af Amer 44 (L) >60 mL/min   GFR calc Af Amer 51 (L) >60 mL/min    Comment: (NOTE) The eGFR has been calculated using the CKD EPI equation. This calculation has not been validated in all clinical situations. eGFR's persistently <60 mL/min signify possible Chronic Kidney Disease.    Anion gap 10 5 - 15  CBC with Differential     Status: Abnormal   Collection Time: 05/28/16  3:26 PM  Result Value Ref Range   WBC 7.6 4.0 - 10.5  K/uL   RBC 3.63 (L) 4.22 - 5.81 MIL/uL   Hemoglobin 11.5 (L) 13.0 - 17.0 g/dL   HCT 36.9 (L) 39.0 - 52.0 %   MCV 101.7 (H) 78.0 - 100.0 fL   MCH 31.7 26.0 - 34.0 pg   MCHC 31.2 30.0 - 36.0 g/dL   RDW 14.1 11.5 - 15.5 %   Platelets 406 (H) 150 - 400 K/uL   Neutrophils Relative % 64 %   Neutro Abs 4.9 1.7 - 7.7 K/uL   Lymphocytes Relative 21 %   Lymphs Abs 1.6 0.7 - 4.0 K/uL   Monocytes Relative 12 %   Monocytes Absolute 0.9 0.1 - 1.0 K/uL   Eosinophils Relative 2 %   Eosinophils Absolute 0.1 0.0 - 0.7 K/uL   Basophils Relative 1 %   Basophils Absolute 0.0 0.0 - 0.1 K/uL  Lipase, blood     Status: None   Collection Time: 05/28/16  3:26 PM  Result Value Ref Range   Lipase 25 11 - 51 U/L  Urinalysis, Routine w reflex microscopic     Status: Abnormal   Collection  Time: 05/28/16  3:30 PM  Result Value Ref Range   Color, Urine YELLOW YELLOW   APPearance CLEAR CLEAR   Specific Gravity, Urine 1.019 1.005 - 1.030   pH 5.0 5.0 - 8.0   Glucose, UA NEGATIVE NEGATIVE mg/dL   Hgb urine dipstick NEGATIVE NEGATIVE   Bilirubin Urine NEGATIVE NEGATIVE   Ketones, ur 20 (A) NEGATIVE mg/dL   Protein, ur NEGATIVE NEGATIVE mg/dL   Nitrite NEGATIVE NEGATIVE   Leukocytes, UA NEGATIVE NEGATIVE    Ct Abdomen Pelvis Wo Contrast  Result Date: 05/28/2016 CLINICAL DATA:  Hypotension and decreased p.o. intake EXAM: CT ABDOMEN AND PELVIS WITHOUT CONTRAST TECHNIQUE: Multidetector CT imaging of the abdomen and pelvis was performed following the standard protocol without IV contrast. COMPARISON:  Abdominal radiograph 05/28/2016 FINDINGS: Lower chest: No pulmonary nodules. No visible pleural or pericardial effusion. Hepatobiliary: Normal hepatic size and contours. No perihepatic ascites. No intra- or extrahepatic biliary dilatation. Multiple noncalcified gallstones. Pancreas: Normal pancreatic contours. No peripancreatic fluid collection or pancreatic ductal dilatation. Spleen: Normal. Adrenals/Urinary Tract:  Normal adrenal glands. No hydronephrosis or nephrolithiasis. No abnormal perinephric stranding. No ureteral obstruction. Stomach/Bowel: The right inguinal canal is partially obscured by streak artifact from right hip arthroplasty, but there is a right inguinal hernia contains a portion of bowel with internal fecalized contents with the lumen measuring up to 3 cm. No dilated small bowel. No intra- abdominal fluid collection. No portal venous gas. The appendix is not visualized. Vascular/Lymphatic: There is atherosclerotic calcification of the non aneurysmal abdominal aorta. No abdominal or pelvic adenopathy. Reproductive: Normal prostate and seminal vesicles. Musculoskeletal: Right total hip arthroplasty. Bones are osteopenic. Multilevel lumbar facet arthrosis. No bony spinal canal stenosis. No lytic or blastic osseous lesions. Normal visualized extrathoracic and extraperitoneal soft tissues. Other: No contributory non-categorized findings. IMPRESSION: 1. Right inguinal hernia containing a loop of bowel with fecalized contents, mildly dilated. Visualization is partially obscured by streak artifact from a right total hip arthroplasty, but this may indicate obstruction or incarceration. 2. Cholelithiasis without evidence of acute cholecystitis. 3. Aortic atherosclerosis. Electronically Signed   By: Ulyses Jarred M.D.   On: 05/28/2016 21:22   Dg Abd Acute W/chest  Result Date: 05/28/2016 CLINICAL DATA:  Hypotension decreased appetite EXAM: DG ABDOMEN ACUTE W/ 1V CHEST COMPARISON:  05/10/2016 FINDINGS: Single-view chest demonstrates hyperinflation. There is no acute consolidation or effusion. Normal heart size. Supine and decubitus views of the abdomen were obtained. Bowel-gas pattern nonobstructed. Large amount of stool in the distal colon and rectum. No free air on decubitus view. No suspicious calcifications. Partially visualized right hip replacement. IMPRESSION: 1. No radiographic evidence for acute  cardiopulmonary abnormality 2. Nonobstructed gas pattern Electronically Signed   By: Donavan Foil M.D.   On: 05/28/2016 18:15    Review of Systems  Constitutional: Positive for malaise/fatigue and weight loss.  HENT: Negative.   Eyes: Negative.   Respiratory: Negative.   Cardiovascular: Negative.   Gastrointestinal: Negative.   Genitourinary: Negative.   Musculoskeletal: Negative.   Skin: Negative.   Neurological: Negative.   Psychiatric/Behavioral: Positive for memory loss.   Blood pressure (!) 145/59, pulse 71, temperature 98 F (36.7 C), resp. rate 20, SpO2 97 %. Physical Exam  Constitutional:  Thin elderly wm in nad  HENT:  Head: Normocephalic and atraumatic.  Eyes: Conjunctivae and EOM are normal. Pupils are equal, round, and reactive to light.  Neck: Normal range of motion. Neck supple.  Cardiovascular: Normal rate, regular rhythm and normal heart sounds.   Respiratory:  Effort normal and breath sounds normal.  GI: Soft. He exhibits no distension. There is no tenderness.  Musculoskeletal: Normal range of motion.  Neurological:  Dementia. Poor historian  Skin: Skin is warm and dry.  Psychiatric:  Can not understand. He does not answer questions     Assessment/Plan: The patient has severe dementia and is on palliative care. He has a right inguinal hernia that is easily reducible. He is not a candidate for surgery. Will sign off  TOTH III,Avonelle Viveros S 05/28/2016, 10:20 PM

## 2016-05-28 NOTE — ED Notes (Signed)
Family is at bedside.  Dr. Clyde LundborgNiu hospitalist at bedside to assess pt.  Pt resting comfortably at this time.  Pt is alert per norm.  Has hx of dementia.

## 2016-05-28 NOTE — ED Notes (Signed)
Pt refused to open mouth for tech to take temperature

## 2016-05-29 DIAGNOSIS — E860 Dehydration: Secondary | ICD-10-CM | POA: Diagnosis present

## 2016-05-29 DIAGNOSIS — Z66 Do not resuscitate: Secondary | ICD-10-CM | POA: Diagnosis present

## 2016-05-29 DIAGNOSIS — F0390 Unspecified dementia without behavioral disturbance: Secondary | ICD-10-CM | POA: Diagnosis present

## 2016-05-29 DIAGNOSIS — N179 Acute kidney failure, unspecified: Secondary | ICD-10-CM | POA: Diagnosis present

## 2016-05-29 DIAGNOSIS — R627 Adult failure to thrive: Secondary | ICD-10-CM | POA: Diagnosis present

## 2016-05-29 DIAGNOSIS — Z96641 Presence of right artificial hip joint: Secondary | ICD-10-CM | POA: Diagnosis present

## 2016-05-29 DIAGNOSIS — Z681 Body mass index (BMI) 19 or less, adult: Secondary | ICD-10-CM | POA: Diagnosis not present

## 2016-05-29 DIAGNOSIS — K4090 Unilateral inguinal hernia, without obstruction or gangrene, not specified as recurrent: Secondary | ICD-10-CM | POA: Diagnosis present

## 2016-05-29 DIAGNOSIS — I10 Essential (primary) hypertension: Secondary | ICD-10-CM | POA: Diagnosis present

## 2016-05-29 DIAGNOSIS — R339 Retention of urine, unspecified: Secondary | ICD-10-CM | POA: Diagnosis present

## 2016-05-29 DIAGNOSIS — Z79899 Other long term (current) drug therapy: Secondary | ICD-10-CM | POA: Diagnosis not present

## 2016-05-29 DIAGNOSIS — K219 Gastro-esophageal reflux disease without esophagitis: Secondary | ICD-10-CM | POA: Diagnosis present

## 2016-05-29 DIAGNOSIS — E43 Unspecified severe protein-calorie malnutrition: Secondary | ICD-10-CM

## 2016-05-29 DIAGNOSIS — F418 Other specified anxiety disorders: Secondary | ICD-10-CM | POA: Diagnosis present

## 2016-05-29 DIAGNOSIS — R64 Cachexia: Secondary | ICD-10-CM | POA: Diagnosis present

## 2016-05-29 DIAGNOSIS — R10813 Right lower quadrant abdominal tenderness: Secondary | ICD-10-CM | POA: Diagnosis not present

## 2016-05-29 LAB — GLUCOSE, CAPILLARY: GLUCOSE-CAPILLARY: 91 mg/dL (ref 65–99)

## 2016-05-29 LAB — BASIC METABOLIC PANEL
ANION GAP: 7 (ref 5–15)
BUN: 25 mg/dL — AB (ref 6–20)
CHLORIDE: 103 mmol/L (ref 101–111)
CO2: 32 mmol/L (ref 22–32)
Calcium: 8.8 mg/dL — ABNORMAL LOW (ref 8.9–10.3)
Creatinine, Ser: 1.07 mg/dL (ref 0.61–1.24)
GFR calc Af Amer: >60 mL/min (ref >60)
GFR, EST NON AFRICAN AMERICAN: 59 mL/min — AB (ref >60)
GLUCOSE: 104 mg/dL — AB (ref 65–99)
POTASSIUM: 3.8 mmol/L (ref 3.5–5.1)
Sodium: 142 mmol/L (ref 135–145)

## 2016-05-29 LAB — CBC
HCT: 33.6 % — ABNORMAL LOW (ref 39.0–52.0)
Hemoglobin: 10.7 g/dL — ABNORMAL LOW (ref 13.0–17.0)
MCH: 32.1 pg (ref 26.0–34.0)
MCHC: 31.8 g/dL (ref 30.0–36.0)
MCV: 100.9 fL — ABNORMAL HIGH (ref 78.0–100.0)
Platelets: 325 10*3/uL (ref 150–400)
RBC: 3.33 MIL/uL — ABNORMAL LOW (ref 4.22–5.81)
RDW: 14 % (ref 11.5–15.5)
WBC: 5.6 10*3/uL (ref 4.0–10.5)

## 2016-05-29 LAB — TYPE AND SCREEN
ABO/RH(D): O POS
ANTIBODY SCREEN: NEGATIVE

## 2016-05-29 LAB — CREATININE, URINE, RANDOM: CREATININE, URINE: 213.08 mg/dL

## 2016-05-29 MED ORDER — ORAL CARE MOUTH RINSE
15.0000 mL | Freq: Two times a day (BID) | OROMUCOSAL | Status: DC
  Administered 2016-05-29 – 2016-05-30 (×3): 15 mL via OROMUCOSAL

## 2016-05-29 MED ORDER — CHLORHEXIDINE GLUCONATE 0.12 % MT SOLN
15.0000 mL | Freq: Two times a day (BID) | OROMUCOSAL | Status: DC
  Administered 2016-05-29 – 2016-05-30 (×3): 15 mL via OROMUCOSAL
  Filled 2016-05-29 (×3): qty 15

## 2016-05-29 NOTE — Progress Notes (Signed)
CSW consulted to assist with d/c planning. Pt was recently dc from [facility]. Please see psychosocial assessment dated 05/14/16. Pt is unable to participate in d/c planning due to his medical condition. CSW spoke with pt's spouse, [phone], to assist with d/c planning. Spouse would like pt to return to [facility] at d/c. CSW has contacted ALF and clinicals sent for review. CSW spoke with Bradly BienenstockAlma Love, Director, to confirm d/c plan. She is able to accept pt back when ready for d/c with palliative care. CSW will continue to follow to assist with d/c planning.  Cori RazorJamie Lailoni Baquera LCSW [phone]

## 2016-05-29 NOTE — Progress Notes (Signed)
No charge note  Palliative consult request received, chart reviewed in detail, surgery note reviewed,patient seen earlier this am.patient is non verbal, does not interact,  There is no family at the bedside, multiple attempts have been made to get in touch with wife.   Patient known to palliative medicine team, initial consult from 05-15-16 reviewed in detail. It is reported that the patient is [facility].   The patient has ongoing progressive decline, hence, discussed with Dr Gonzella Lexhungel and later on also discussed with LCSW Marijean NiemannJaime that final recommendations from the palliative team are for [facility] full [facility] services.   Rosalin HawkingZeba Kalianna Verbeke MD [facility] palliative [facility] team [phone]

## 2016-05-29 NOTE — Progress Notes (Signed)
PROGRESS NOTE                                                                                                                                                                                                             Patient Demographics:    Wayne Schroeder, is a 82 y.o. male, DOB - 21-Apr-1925, ZOX:[id number]  Admit date - 05/28/2016   Admitting Physician Lorretta HarpXilin Niu, MD  Outpatient Primary MD for the patient is Martha ClanShaw, William, MD  LOS - 0    Chief Complaint  Patient presents with  . decreased appetite       Brief Narrative   82 y.o. male with medical history significant of dementia, hypertension, GERD, depression, anxiety, right inguinal hernia, who presents with decreased oral intake, failure to thrive. In the ED patient was found to have mild acute kidney injury, hypotensive. CT abdomen showing right inguinal hernia containing bowel loop. Surgery consulted and patient observed on hospitalist service.   Subjective:   Patient severely demented and unable to communicate.   Assessment  & Plan :    Principal Problem:   Abdominal Pain with right inguinal hernia CT shows right inguinal hernia with bowel loop and possible obstruction. Seen by surgery and it was easily reducible. Recommended no surgical intervention needed. On further exam abdomen was nontender and hernia still easily reducible. Resume diet. Added supplement if tolerated.  Active Problems:   Protein-calorie malnutrition, severe Due to poor by mouth intake. Patient severely demented and totally dependent on his ADLs. Nutrition consult appreciated. Added supplement.    Acute renal failure (ARF) (HCC) Secondary to dehydration. Improved with fluids.  Severe failure to thrive With advanced dementia, poor by mouth intake and severe malnutrition. Discussed with wife on the phone. Understands patient's overall functional decline. Discussed options on  returning back to memory care unit with parity care follow-up and she agrees.  Acute urinary retention Foley placed on admission. add Flomax.    Code Status : DO NOT RESUSCITATE  Family Communication  : Spoke with wife on the phone  Disposition Plan  : Return back to memory care unit at palliative care/hospice follow-up in a.m.  Barriers For Discharge : Improving symptoms  Consults  :  Palliative care  Procedures  : CT abdomen and pelvis  DVT Prophylaxis  :  Lovenox -   Lab Results  Component Value Date   PLT 325 05/29/2016    Antibiotics    Anti-infectives    None        Objective:   Vitals:   05/28/16 2137 05/29/16 0048 05/29/16 0542 05/29/16 1443  BP: (!) 145/59  134/73 (!) 143/47  Pulse: 71  85 (!) 54  Resp: 20  14 16   Temp: 98 F (36.7 C)  97.6 F (36.4 C) (!) 94.1 F (34.5 C)  TempSrc:   Axillary Axillary  SpO2: 97%  97% (!) 81%  Weight:  47.5 kg (104 lb 12.8 oz)    Height:  5\' 10"  (1.778 m)      Wt Readings from Last 3 Encounters:  05/29/16 47.5 kg (104 lb 12.8 oz)  05/10/16 49.9 kg (110 lb 0.2 oz)  07/04/15 52.7 kg (116 lb 3.2 oz)     Intake/Output Summary (Last 24 hours) at 05/29/16 1605 Last data filed at 05/29/16 0700  Gross per 24 hour  Intake           1747.5 ml  Output                0 ml  Net           1747.5 ml     Physical Exam  Gen: Elderly thin built male, poorly communicative not in distress HEENT: Dry mucosa,, supple neck Chest: clear b/l, no added sounds CVS: N S1&S2, no murmurs GI: soft, NT, ND, BS+, denies suicide inguinal hernia, Foley placed on admission Musculoskeletal: warm, no edema CNS: Awake, not oriented    Data Review:    CBC  Recent Labs Lab 05/28/16 1526 05/29/16 0353  WBC 7.6 5.6  HGB 11.5* 10.7*  HCT 36.9* 33.6*  PLT 406* 325  MCV 101.7* 100.9*  MCH 31.7 32.1  MCHC 31.2 31.8  RDW 14.1 14.0  LYMPHSABS 1.6  --   MONOABS 0.9  --   EOSABS 0.1  --   BASOSABS 0.0  --     Chemistries    Recent Labs Lab 05/28/16 1526 05/29/16 0353  NA 141 142  K 4.0 3.8  CL 99* 103  CO2 32 32  GLUCOSE 105* 104*  BUN 29* 25*  CREATININE 1.38* 1.07  CALCIUM 9.4 8.8*  AST 54*  --   ALT 22  --   ALKPHOS 65  --   BILITOT 0.7  --    ------------------------------------------------------------------------------------------------------------------ No results for input(s): CHOL, HDL, LDLCALC, TRIG, CHOLHDL, LDLDIRECT in the last 72 hours.  No results found for: HGBA1C ------------------------------------------------------------------------------------------------------------------ No results for input(s): TSH, T4TOTAL, T3FREE, THYROIDAB in the last 72 hours.  Invalid input(s): FREET3 ------------------------------------------------------------------------------------------------------------------ No results for input(s): VITAMINB12, FOLATE, FERRITIN, TIBC, IRON, RETICCTPCT in the last 72 hours.  Coagulation profile  Recent Labs Lab 05/28/16 2315  INR 1.03    No results for input(s): DDIMER in the last 72 hours.  Cardiac Enzymes No results for input(s): CKMB, TROPONINI, MYOGLOBIN in the last 168 hours.  Invalid input(s): CK ------------------------------------------------------------------------------------------------------------------ No results found for: BNP  Inpatient Medications  Scheduled Meds: . chlorhexidine  15 mL Mouth Rinse BID  . escitalopram  10 mg Oral Daily  . feeding supplement (ENSURE ENLIVE)  237 mL Oral TID BM  . feeding supplement (PRO-STAT SUGAR FREE 64)  60 mL Oral BID  . free water  200 mL Oral Q6H  . mouth rinse  15 mL Mouth Rinse q12n4p  . OLANZapine zydis  2.5 mg Oral QHS  . pantoprazole  40 mg  Oral Daily  . polyvinyl alcohol  2 drop Both Eyes BID  . QUEtiapine  12.5 mg Oral Daily  . traZODone  50 mg Oral QHS   Continuous Infusions: . sodium chloride 125 mL/hr at 05/29/16 0938   PRN Meds:.acetaminophen **OR** acetaminophen,  haloperidol, iopamidol, loperamide, LORazepam, morphine injection, ondansetron **OR** ondansetron (ZOFRAN) IV, traMADol, zolpidem  Micro Results No results found for this or any previous visit (from the past 240 hour(s)).  Radiology Reports Ct Abdomen Pelvis Wo Contrast  Result Date: 05/28/2016 CLINICAL DATA:  Hypotension and decreased p.o. intake EXAM: CT ABDOMEN AND PELVIS WITHOUT CONTRAST TECHNIQUE: Multidetector CT imaging of the abdomen and pelvis was performed following the standard protocol without IV contrast. COMPARISON:  Abdominal radiograph 05/28/2016 FINDINGS: Lower chest: No pulmonary nodules. No visible pleural or pericardial effusion. Hepatobiliary: Normal hepatic size and contours. No perihepatic ascites. No intra- or extrahepatic biliary dilatation. Multiple noncalcified gallstones. Pancreas: Normal pancreatic contours. No peripancreatic fluid collection or pancreatic ductal dilatation. Spleen: Normal. Adrenals/Urinary Tract: Normal adrenal glands. No hydronephrosis or nephrolithiasis. No abnormal perinephric stranding. No ureteral obstruction. Stomach/Bowel: The right inguinal canal is partially obscured by streak artifact from right hip arthroplasty, but there is a right inguinal hernia contains a portion of bowel with internal fecalized contents with the lumen measuring up to 3 cm. No dilated small bowel. No intra- abdominal fluid collection. No portal venous gas. The appendix is not visualized. Vascular/Lymphatic: There is atherosclerotic calcification of the non aneurysmal abdominal aorta. No abdominal or pelvic adenopathy. Reproductive: Normal prostate and seminal vesicles. Musculoskeletal: Right total hip arthroplasty. Bones are osteopenic. Multilevel lumbar facet arthrosis. No bony spinal canal stenosis. No lytic or blastic osseous lesions. Normal visualized extrathoracic and extraperitoneal soft tissues. Other: No contributory non-categorized findings. IMPRESSION: 1. Right inguinal  hernia containing a loop of bowel with fecalized contents, mildly dilated. Visualization is partially obscured by streak artifact from a right total hip arthroplasty, but this may indicate obstruction or incarceration. 2. Cholelithiasis without evidence of acute cholecystitis. 3. Aortic atherosclerosis. Electronically Signed   By: Deatra RobinsonKevin  Herman M.D.   On: 05/28/2016 21:22   Dg Chest 2 View  Result Date: 05/10/2016 CLINICAL DATA:  Fever and unresponsiveness. Possible urinary tract infection. EXAM: CHEST  2 VIEW COMPARISON:  03/29/2015 FINDINGS: Shallow inspiration. Normal heart size and pulmonary vascularity. No focal airspace disease or consolidation in the lungs. No blunting of costophrenic angles. No pneumothorax. Mediastinal contours appear intact. Degenerative changes in the spine. IMPRESSION: Shallow inspiration.  No evidence of active pulmonary disease. Electronically Signed   By: Burman NievesWilliam  Stevens M.D.   On: 05/10/2016 01:31   Ct Head Wo Contrast  Result Date: 05/13/2016 CLINICAL DATA:  82 year old male with fever, confusion, unresponsive. Sepsis. Underlying dementia. Initial encounter. EXAM: CT HEAD WITHOUT CONTRAST TECHNIQUE: Contiguous axial images were obtained from the base of the skull through the vertex without intravenous contrast. COMPARISON:  None. FINDINGS: Brain: Generalized cerebral volume loss. No cortical encephalomalacia identified. No midline shift, ventriculomegaly, mass effect, evidence of mass lesion, intracranial hemorrhage or evidence of cortically based acute infarction. Mild for age nonspecific periventricular white matter hypodensity. Otherwise gray-white matter differentiation is within normal limits throughout the brain. Vascular: Calcified atherosclerosis at the skull base. No suspicious intracranial vascular hyperdensity. Skull: No acute osseous abnormality identified. Sinuses/Orbits: At least moderate opacification of the visible posterior ethmoid and right sphenoid  sinuses. Visible frontal sinuses, bilateral tympanic cavities, and mastoids are clear. Other: Visualized orbits and scalp soft tissues are within normal limits. IMPRESSION:  1.  No acute intracranial abnormality. 2. Generalized cerebral volume loss, otherwise negative for age noncontrast CT appearance of the brain. 3. Ethmoid and sphenoid sinus inflammatory changes. Consider acute sinusitis. Electronically Signed   By: Odessa Fleming M.D.   On: 05/13/2016 16:16   Dg Abd Acute W/chest  Result Date: 05/28/2016 CLINICAL DATA:  Hypotension decreased appetite EXAM: DG ABDOMEN ACUTE W/ 1V CHEST COMPARISON:  05/10/2016 FINDINGS: Single-view chest demonstrates hyperinflation. There is no acute consolidation or effusion. Normal heart size. Supine and decubitus views of the abdomen were obtained. Bowel-gas pattern nonobstructed. Large amount of stool in the distal colon and rectum. No free air on decubitus view. No suspicious calcifications. Partially visualized right hip replacement. IMPRESSION: 1. No radiographic evidence for acute cardiopulmonary abnormality 2. Nonobstructed gas pattern Electronically Signed   By: Jasmine Pang M.D.   On: 05/28/2016 18:15    Time Spent in minutes  25   Eddie North M.D on 05/29/2016 at 4:05 PM  Between 7am to 7pm - Pager - [phone]  After 7pm go to [email or web address] - password [facility]  [facility] -  Office  [phone]

## 2016-05-29 NOTE — Progress Notes (Signed)
Initial Nutrition Assessment  DOCUMENTATION CODES:   Severe malnutrition in context of chronic illness, Underweight  INTERVENTION:   Diet advancement per MD Continue Ensure Enlive po TID, each supplement provides 350 kcal and 20 grams of protein. Prostat liquid protein PO 30 ml BID with meals, each supplement provides 100 kcal, 15 grams protein.  RD to monitor for plan  NUTRITION DIAGNOSIS:   Malnutrition related to chronic illness as evidenced by severe depletion of body fat, severe depletion of muscle mass, percent weight loss.  GOAL:   Patient will meet greater than or equal to 90% of their needs  MONITOR:   PO intake, Supplement acceptance, Labs, Weight trends, Skin, I & O's  REASON FOR ASSESSMENT:   Consult Assessment of nutrition requirement/status  ASSESSMENT:   82 y.o. male with medical history significant of dementia, hypertension, GERD, depression, anxiety, right inguinal hernia, who presents with decreased oral intake, failure to thrive.  Patient with severe dementia with no family at bedside. Pt familiar to RD from previous admission in November.  Per chart review, pt has had decreased appetite and PO intake at facility. Pt continues to lose weight (17% wt loss x 2 months, significant for time frame). Pt sleeping so did not perform NFPE but suspect that severe fat and muscle depletion still present since pt continues to eat poorly and has weight loss. Pt already has Ensure ordered and Prostat. Will continue orders even though pt has NPO diet order in place.  Labs reviewed. Medications: Free water 200 ml every 6 hours, Protonix tablet daily  Diet Order:  Diet NPO time specified Except for: Ice Chips, Sips with Meds  Skin:  Wound (see comment) (Stage I sacral wound, skin tear on Rt arm)  Last BM:  PTA  Height:   Ht Readings from Last 1 Encounters:  05/29/16 5\' 10"  (1.778 m)    Weight:   Wt Readings from Last 1 Encounters:  05/29/16 104 lb 12.8 oz  (47.5 kg)    Ideal Body Weight:  75.5 kg  BMI:  Body mass index is 15.04 kg/m.  Estimated Nutritional Needs:   Kcal:  1200-1400  Protein:  60-70g  Fluid:  1.4L/day  EDUCATION NEEDS:   No education needs identified at this time  Tilda FrancoLindsey Naesha Buckalew, MS, RD, LDN Pager: [phone] After Hours Pager: [phone]

## 2016-05-30 DIAGNOSIS — F0390 Unspecified dementia without behavioral disturbance: Secondary | ICD-10-CM

## 2016-05-30 DIAGNOSIS — I10 Essential (primary) hypertension: Secondary | ICD-10-CM

## 2016-05-30 DIAGNOSIS — F03C0 Unspecified dementia, severe, without behavioral disturbance, psychotic disturbance, mood disturbance, and anxiety: Secondary | ICD-10-CM | POA: Diagnosis present

## 2016-05-30 DIAGNOSIS — R10813 Right lower quadrant abdominal tenderness: Secondary | ICD-10-CM

## 2016-05-30 LAB — GLUCOSE, CAPILLARY
Glucose-Capillary: 67 mg/dL (ref 65–99)
Glucose-Capillary: 114 mg/dL — ABNORMAL HIGH (ref 65–99)

## 2016-05-30 LAB — UREA NITROGEN, URINE: Urea Nitrogen, Ur: 845 mg/dL

## 2016-05-30 LAB — URINE CULTURE: Culture: NO GROWTH

## 2016-05-30 MED ORDER — DEXTROSE 50 % IV SOLN
INTRAVENOUS | Status: AC
  Filled 2016-05-30: qty 50

## 2016-05-30 MED ORDER — TAMSULOSIN HCL 0.4 MG PO CAPS: 0.4000 mg | ORAL_CAPSULE | Freq: Every day | ORAL | 0 refills | Status: AC

## 2016-05-30 MED ORDER — DEXTROSE 50 % IV SOLN: 25.0000 mL | Freq: Once | INTRAVENOUS | Status: AC

## 2016-05-30 MED ORDER — BISACODYL 10 MG RE SUPP: 10.0000 mg | Freq: Every day | RECTAL | Status: DC

## 2016-05-30 NOTE — Progress Notes (Signed)
Hypoglycemic Event  CBG: 67  Treatment: D50 IV 25 mL  Symptoms: None  Follow-up CBG: [id number] CBG Result:114  Possible Reasons for Event: Inadequate meal intake  Comments/MD notified:Dr. Alois Clichehungel    Deaken Jurgens, Francesca Omanonna Marie C

## 2016-05-30 NOTE — Progress Notes (Signed)
Foley d/c'd per MD order, patient tolerated the procedure. No bleeding noted.

## 2016-05-30 NOTE — Progress Notes (Signed)
Report given to Loa I. [facility].All questions answered appropriately.

## 2016-05-30 NOTE — Progress Notes (Signed)
Patient d/c'd tp [facility] via [facility]. Was given Ativan 0.5 mg before transport due to agitation. Awake . Oriented to self. Stable.

## 2016-05-30 NOTE — Progress Notes (Signed)
Pt is ready to [facility]. Spouse is aware and in agreement with this plan. D/C Summary / FL2 sent to ALF for review. Script included in d/c packet. [facility] transport required. Medical necessity form completed. # for report provided to nsg. [facility] will continue to follow pt at facility.  Cori RazorJamie Roben Schliep LCSW [phone]

## 2016-05-30 NOTE — NC FL2 (Signed)
MEDICAID FL2 LEVEL OF CARE SCREENING TOOL     IDENTIFICATION  Patient Name: Wayne SimmeringJohn R Schroeder Birthdate: [birth year] Sex: male Admission Date (Current Location): 05/28/2016  [facility] [place]   Facility and Address:  [facility],  [address], [place] [id number]      Provider Number: [id number]  Attending Physician Name and Address:  Eddie NorthNishant Dhungel, MD  Relative Name and Phone Number:       Current Level of Care: Hospital Recommended Level of Care: Assisted Living Facility, Memory Care Prior Approval Number:    Date Approved/Denied:   PASRR Number:    Discharge Plan: Other (Comment) (ALF / Memory Care unit)    Current Diagnoses: Patient Active Problem List   Diagnosis Date Noted  . Severe dementia 05/30/2016  . Failure to thrive in adult 05/29/2016  . Dehydration 05/28/2016  . Abdominal tenderness 05/28/2016  . GERD (gastroesophageal reflux disease) 05/28/2016  . Depression with anxiety 05/28/2016  . Failure to thrive (0-17) 05/28/2016  . Right inguinal hernia 05/28/2016  . Abdominal pain   . Altered mental status   . Fever   . Goals of care, counseling/discussion   . Acute kidney injury (nontraumatic) (HCC)   . Urinary tract infection without hematuria   . Acute cystitis without hematuria 05/10/2016  . Essential hypertension 05/10/2016  . Pressure injury of skin 05/10/2016  . Palliative care encounter   . Hypokalemia 03/31/2015  . Acute renal failure (ARF) (HCC)   . Urinary tract infectious disease   . Protein-calorie malnutrition, severe 03/30/2015  . Sepsis, unspecified organism (HCC) 03/29/2015  . Acute hypernatremia 03/29/2015  . Elevated troponin 03/28/2015  . UTI (urinary tract infection) 03/28/2015  . Leukocytosis 03/25/2015  . Macrocytic anemia 03/24/2015  . Unspecified dementia with behavioral disturbance 03/22/2015    Orientation RESPIRATION BLADDER Height & Weight     Self  Normal Incontinent/  foley cath Weight: 104 lb 12.8 oz (47.5 kg) Height:  5\' 10"  (177.8 cm)  BEHAVIORAL SYMPTOMS/MOOD NEUROLOGICAL BOWEL NUTRITION STATUS  Other (Comment) (no behaviors)   incontinent Diet (regular)  AMBULATORY STATUS COMMUNICATION OF NEEDS Skin   Total Care Verbally Normal                       Personal Care Assistance Level of Assistance  Bathing, Feeding, Dressing Bathing Assistance: Maximum assistance Feeding assistance: Maximum assistance Dressing Assistance: Maximum assistance Total Care Assistance: Maximum assistance   Functional Limitations Info  Sight, Hearing, Speech Sight Info: Adequate Hearing Info: Adequate Speech Info: Adequate    SPECIAL CARE FACTORS FREQUENCY                       Contractures Contractures Info: Not present    Additional Factors Info  Code Status Code Status Info: DNR Allergies Info: NKA     Isolation Precautions Info: 11.25.17 mrsa + by pcr     Current Medications (05/30/2016):  This is the current hospital active medication list Current Facility-Administered Medications  Medication Dose Route Frequency Provider Last Rate Last Dose  . 0.9 %  sodium chloride infusion   Intravenous Continuous Lorretta HarpXilin Niu, MD 125 mL/hr at 05/30/16 [phone]    . acetaminophen (TYLENOL) tablet 650 mg  650 mg Oral Q6H PRN Lorretta HarpXilin Niu, MD       Or  . acetaminophen (TYLENOL) suppository 650 mg  650 mg Rectal Q6H PRN Lorretta HarpXilin Niu, MD      . chlorhexidine (  PERIDEX) 0.12 % solution 15 mL  15 mL Mouth Rinse BID Lorretta HarpXilin Niu, MD   15 mL at 05/30/16 0941  . dextrose 50 % solution 25 mL  25 mL Intravenous Once Nishant Dhungel, MD      . dextrose 50 % solution           . escitalopram (LEXAPRO) tablet 10 mg  10 mg Oral Daily Lorretta HarpXilin Niu, MD   10 mg at 05/30/16 [phone]  . feeding supplement (ENSURE ENLIVE) (ENSURE ENLIVE) liquid 237 mL  237 mL Oral TID BM Lorretta HarpXilin Niu, MD   237 mL at 05/29/16 0938  . feeding supplement (PRO-STAT SUGAR FREE 64) liquid 60 mL  60 mL Oral BID Lorretta HarpXilin Niu,  MD   60 mL at 05/30/16 0942  . free water 200 mL  200 mL Oral Q6H Lorretta HarpXilin Niu, MD      . haloperidol (HALDOL) tablet 0.5 mg  0.5 mg Oral Q6H PRN Lorretta HarpXilin Niu, MD   0.5 mg at 05/29/16 2154  . iopamidol (ISOVUE-300) 61 % injection 30 mL  30 mL Oral Once PRN Lorretta HarpXilin Niu, MD      . loperamide (IMODIUM) capsule 2 mg  2 mg Oral QID PRN Lorretta HarpXilin Niu, MD      . LORazepam (ATIVAN) tablet 0.5 mg  0.5 mg Oral BID PRN Lorretta HarpXilin Niu, MD   0.5 mg at 05/29/16 1439  . MEDLINE mouth rinse  15 mL Mouth Rinse q12n4p Lorretta HarpXilin Niu, MD   15 mL at 05/30/16 1200  . morphine 2 MG/ML injection 1 mg  1 mg Intravenous Q4H PRN Lorretta HarpXilin Niu, MD      . OLANZapine zydis (ZYPREXA) disintegrating tablet 2.5 mg  2.5 mg Oral QHS Lorretta HarpXilin Niu, MD   2.5 mg at 05/29/16 2153  . ondansetron (ZOFRAN) tablet 4 mg  4 mg Oral Q6H PRN Lorretta HarpXilin Niu, MD       Or  . ondansetron [facility](ZOFRAN) injection 4 mg  4 mg Intravenous Q6H PRN Lorretta HarpXilin Niu, MD      . pantoprazole (PROTONIX) EC tablet 40 mg  40 mg Oral Daily Lorretta HarpXilin Niu, MD   40 mg at 05/30/16 0942  . polyvinyl alcohol (LIQUIFILM TEARS) 1.4 % ophthalmic solution 2 drop  2 drop Both Eyes BID Lorretta HarpXilin Niu, MD   2 drop at 05/30/16 [phone]  . QUEtiapine (SEROQUEL) tablet 12.5 mg  12.5 mg Oral Daily Lorretta HarpXilin Niu, MD   12.5 mg at 05/30/16 0941  . traMADol (ULTRAM) tablet 50 mg  50 mg Oral BID PRN Lorretta HarpXilin Niu, MD      . traZODone (DESYREL) tablet 50 mg  50 mg Oral QHS Lorretta HarpXilin Niu, MD   50 mg at 05/29/16 2154  . zolpidem (AMBIEN) tablet 5 mg  5 mg Oral QHS PRN Lorretta HarpXilin Niu, MD         Discharge Medications Please see discharge summary for a list of discharge me TAKE these medications   escitalopram 10 MG tablet Commonly known as:  LEXAPRO Take 10 mg by mouth daily.  feeding supplement (ENSURE ENLIVE) Liqd Take 237 mLs by mouth 3 (three) times daily between meals.  free water Soln Take 200 mLs by mouth every 6 (six) hours.  haloperidol 0.5 MG tablet Commonly known as:  HALDOL Take 1 tablet (0.5 mg total) by mouth every 6 (six) hours as  needed for agitation.  hydrochlorothiazide 25 MG tablet Commonly known as:  HYDRODIURIL Take 25 mg by mouth daily.  loperamide 2 MG tablet Commonly known as:  IMODIUM A-D Take 2 mg by mouth 4 (four) times daily as needed for diarrhea or loose stools.  LORazepam 0.5 MG tablet Commonly known as:  ATIVAN Take 0.5 mg by mouth 2 (two) times daily as needed for anxiety.  OCUSOFT EYELID CLEANSING Pads Apply topically 2 (two) times daily.  OLANZapine zydis 5 MG disintegrating tablet Commonly known as:  ZYPREXA Take 0.5 tablets (2.5 mg total) by mouth at bedtime. What changed:  how much to take  ondansetron 4 MG tablet Commonly known as:  ZOFRAN Take 1 tablet (4 mg total) by mouth every 6 (six) hours as needed for nausea.  pantoprazole 40 MG tablet Commonly known as:  PROTONIX Take 1 tablet (40 mg total) by mouth daily.  PROCEL 100 Powd Take 2 scoop by mouth 2 (two) times daily.  QUEtiapine 25 MG tablet Commonly known as:  SEROQUEL Take 12.5 mg by mouth daily.  SYSTANE BALANCE 0.6 % Soln Generic drug:  Propylene Glycol Apply 2 drops to eye 2 (two) times daily.  Cap Tamsulosin ( flomax)  0.4 mg capsule Take 1 capsule by my before dinner.  traMADol 50 MG tablet Commonly known as:  ULTRAM Take 1 tablet (50 mg total) by mouth 2 (two) times daily as needed for moderate pain.  traZODone 50 MG tablet Commonly known as:  DESYREL Take 50 mg by mouth at bedtime.  UNABLE TO FIND Take 1 each by mouth 2 (two) times daily. Mighty shake    Relevant Imaging Results:  Relevant Lab Results:   Additional Information : Please continue with [facility] services at facility. ss#[phone]  Zymier Rodgers, Dickey Gave, LCSW

## 2016-05-30 NOTE — Progress Notes (Signed)
Patient had a large  bowel movement

## 2016-05-30 NOTE — Progress Notes (Signed)
Date: May 30, 2016 Discharge orders checked for needs. No case management needs present at time of discharge. Jeremih Dearmas, RN, BSN, CCM   [phone] 

## 2016-05-30 NOTE — Progress Notes (Signed)
Patient has not voided since 1100. MD notified. Will insert foley.

## 2016-05-30 NOTE — Discharge Summary (Addendum)
Physician Discharge Summary  Kellie SimmeringJohn R Pendergraph ZOX:[id number] DOB: 11/14/1924 DOA: 05/28/2016  PCP: Martha ClanShaw, William, MD  Admit date: 05/28/2016 Discharge date: 05/30/2016  Admitted From: [facility] Disposition:  Return to memory care unit with hospice follow-up  Recommendations for Outpatient Follow-up:  1. Follow up with Hospice and palliative [place] at the memory care unit in one week 2. patient is being discharged on foley for acute urinary retention.  Home Health:none Equipment/Devices:none  Discharge Condition: Guarded CODE STATUS: DO NOT RESUSCITATE Diet recommendation: Regular with supplements    Discharge Diagnoses:  Principal Problem:   Abdominal tenderness   Active Problems:   Acute renal failure (ARF) (HCC)   Failure to thrive in adult   Protein-calorie malnutrition, severe   Essential hypertension   Dehydration   GERD (gastroesophageal reflux disease)   Depression with anxiety   Failure to thrive (0-17)   Right inguinal hernia   Severe dementia  Brief narrative/history of present illness 82 y.o.malewith medical history significant of dementia, hypertension, GERD, depression, anxiety, right inguinal hernia, who presents with decreased oral intake, failure to thrive. In the ED patient was found to have mild acute kidney injury, hypotensive. CT abdomen showing right inguinal hernia containing bowel loop. Surgery consulted and patient observed on hospitalist service.  Hospital course   Principal Problem:   Abdominal Pain with right inguinal hernia CT shows right inguinal hernia with bowel loop and possible obstruction. Seen by surgery and it was easily reducible. Recommended no surgical intervention needed.  Abdomen remains nontender and hernia easily reducible. Resume diet and supplement.  Active Problems:   Protein-calorie malnutrition, severe Due to poor by mouth intake. Patient severely demented and totally dependent on his  ADLs. Nutrition consult appreciated. Tinea supplement.    Acute renal failure (ARF) (HCC) Secondary to dehydration. Improved with fluids.  Severe failure to thrive With advanced dementia, poor by mouth intake and severe malnutrition. Discussed with wife on the phone. Understands patient's overall functional decline. Discussed options on returning back to memory care unit with palliative care follow-up and she agrees.  Acute urinary retention Possibly due to BPH and ? Constipation. Foley placed on admission. added Flomax. Foley discontinued but unable to void. Placed back in and will be discharged on foley. Can attempt voiding trial after few days on flomax.     Family Communication  : Spoke with wife on the phone  Disposition Plan  : Return back to memory care unit at palliative care/hospice follow-up    Consults  :   Palliative care Surgery  Procedures  : CT abdomen and pelvis  Discharge Instructions   Allergies as of 05/30/2016   No Known Allergies     Medication List    TAKE these medications   escitalopram 10 MG tablet Commonly known as:  LEXAPRO Take 10 mg by mouth daily.   feeding supplement (ENSURE ENLIVE) Liqd Take 237 mLs by mouth 3 (three) times daily between meals.   free water Soln Take 200 mLs by mouth every 6 (six) hours.   haloperidol 0.5 MG tablet Commonly known as:  HALDOL Take 1 tablet (0.5 mg total) by mouth every 6 (six) hours as needed for agitation.   hydrochlorothiazide 25 MG tablet Commonly known as:  HYDRODIURIL Take 25 mg by mouth daily.   loperamide 2 MG tablet Commonly known as:  IMODIUM A-D Take 2 mg by mouth 4 (four) times daily as needed for diarrhea or loose stools.   LORazepam 0.5 MG tablet Commonly known  as:  ATIVAN Take 0.5 mg by mouth 2 (two) times daily as needed for anxiety.   OCUSOFT EYELID CLEANSING Pads Apply topically 2 (two) times daily.   OLANZapine zydis 5 MG disintegrating tablet Commonly  known as:  ZYPREXA Take 0.5 tablets (2.5 mg total) by mouth at bedtime. What changed:  how much to take   ondansetron 4 MG tablet Commonly known as:  ZOFRAN Take 1 tablet (4 mg total) by mouth every 6 (six) hours as needed for nausea.   pantoprazole 40 MG tablet Commonly known as:  PROTONIX Take 1 tablet (40 mg total) by mouth daily.   PROCEL 100 Powd Take 2 scoop by mouth 2 (two) times daily.   QUEtiapine 25 MG tablet Commonly known as:  SEROQUEL Take 12.5 mg by mouth daily.   SYSTANE BALANCE 0.6 % Soln Generic drug:  Propylene Glycol Apply 2 drops to eye 2 (two) times daily.  Cap Tamsulosin ( flomax)  0.4 mg capsule Take 1 capsule by my before dinner.   traMADol 50 MG tablet Commonly known as:  ULTRAM Take 1 tablet (50 mg total) by mouth 2 (two) times daily as needed for moderate pain.   traZODone 50 MG tablet Commonly known as:  DESYREL Take 50 mg by mouth at bedtime.   UNABLE TO FIND Take 1 each by mouth 2 (two) times daily. Mighty shake      Follow-up Information    [facility] at memory care unit Follow up in 1 week(s).          No Known Allergies      Procedures/Studies: Ct Abdomen Pelvis Wo Contrast  Result Date: 05/28/2016 CLINICAL DATA:  Hypotension and decreased p.o. intake EXAM: CT ABDOMEN AND PELVIS WITHOUT CONTRAST TECHNIQUE: Multidetector CT imaging of the abdomen and pelvis was performed following the standard protocol without IV contrast. COMPARISON:  Abdominal radiograph 05/28/2016 FINDINGS: Lower chest: No pulmonary nodules. No visible pleural or pericardial effusion. Hepatobiliary: Normal hepatic size and contours. No perihepatic ascites. No intra- or extrahepatic biliary dilatation. Multiple noncalcified gallstones. Pancreas: Normal pancreatic contours. No peripancreatic fluid collection or pancreatic ductal dilatation. Spleen: Normal. Adrenals/Urinary Tract: Normal adrenal glands. No hydronephrosis or nephrolithiasis. No abnormal perinephric  stranding. No ureteral obstruction. Stomach/Bowel: The right inguinal canal is partially obscured by streak artifact from right hip arthroplasty, but there is a right inguinal hernia contains a portion of bowel with internal fecalized contents with the lumen measuring up to 3 cm. No dilated small bowel. No intra- abdominal fluid collection. No portal venous gas. The appendix is not visualized. Vascular/Lymphatic: There is atherosclerotic calcification of the non aneurysmal abdominal aorta. No abdominal or pelvic adenopathy. Reproductive: Normal prostate and seminal vesicles. Musculoskeletal: Right total hip arthroplasty. Bones are osteopenic. Multilevel lumbar facet arthrosis. No bony spinal canal stenosis. No lytic or blastic osseous lesions. Normal visualized extrathoracic and extraperitoneal soft tissues. Other: No contributory non-categorized findings. IMPRESSION: 1. Right inguinal hernia containing a loop of bowel with fecalized contents, mildly dilated. Visualization is partially obscured by streak artifact from a right total hip arthroplasty, but this may indicate obstruction or incarceration. 2. Cholelithiasis without evidence of acute cholecystitis. 3. Aortic atherosclerosis. Electronically Signed   By: Deatra Robinson M.D.   On: 05/28/2016 21:22   Dg Chest 2 View  Result Date: 05/10/2016 CLINICAL DATA:  Fever and unresponsiveness. Possible urinary tract infection. EXAM: CHEST  2 VIEW COMPARISON:  03/29/2015 FINDINGS: Shallow inspiration. Normal heart size and pulmonary vascularity. No focal airspace disease or consolidation in the  lungs. No blunting of costophrenic angles. No pneumothorax. Mediastinal contours appear intact. Degenerative changes in the spine. IMPRESSION: Shallow inspiration.  No evidence of active pulmonary disease. Electronically Signed   By: Burman NievesWilliam  Stevens M.D.   On: 05/10/2016 01:31   Ct Head Wo Contrast  Result Date: 05/13/2016 CLINICAL DATA:  82 year old male with fever,  confusion, unresponsive. Sepsis. Underlying dementia. Initial encounter. EXAM: CT HEAD WITHOUT CONTRAST TECHNIQUE: Contiguous axial images were obtained from the base of the skull through the vertex without intravenous contrast. COMPARISON:  None. FINDINGS: Brain: Generalized cerebral volume loss. No cortical encephalomalacia identified. No midline shift, ventriculomegaly, mass effect, evidence of mass lesion, intracranial hemorrhage or evidence of cortically based acute infarction. Mild for age nonspecific periventricular white matter hypodensity. Otherwise gray-white matter differentiation is within normal limits throughout the brain. Vascular: Calcified atherosclerosis at the skull base. No suspicious intracranial vascular hyperdensity. Skull: No acute osseous abnormality identified. Sinuses/Orbits: At least moderate opacification of the visible posterior ethmoid and right sphenoid sinuses. Visible frontal sinuses, bilateral tympanic cavities, and mastoids are clear. Other: Visualized orbits and scalp soft tissues are within normal limits. IMPRESSION: 1.  No acute intracranial abnormality. 2. Generalized cerebral volume loss, otherwise negative for age noncontrast CT appearance of the brain. 3. Ethmoid and sphenoid sinus inflammatory changes. Consider acute sinusitis. Electronically Signed   By: Odessa FlemingH  Hall M.D.   On: 05/13/2016 16:16   Dg Abd Acute W/chest  Result Date: 05/28/2016 CLINICAL DATA:  Hypotension decreased appetite EXAM: DG ABDOMEN ACUTE W/ 1V CHEST COMPARISON:  05/10/2016 FINDINGS: Single-view chest demonstrates hyperinflation. There is no acute consolidation or effusion. Normal heart size. Supine and decubitus views of the abdomen were obtained. Bowel-gas pattern nonobstructed. Large amount of stool in the distal colon and rectum. No free air on decubitus view. No suspicious calcifications. Partially visualized right hip replacement. IMPRESSION: 1. No radiographic evidence for acute  cardiopulmonary abnormality 2. Nonobstructed gas pattern Electronically Signed   By: Jasmine PangKim  Fujinaga M.D.   On: 05/28/2016 18:15       Subjective: Appears more awake and alert. Not in distress  Discharge Exam: Vitals:   05/29/16 2215 05/30/16 0629  BP: (!) 121/56 (!) 154/78  Pulse: 77 91  Resp: 16 18  Temp: 97.7 F (36.5 C) 97.5 F (36.4 C)   Vitals:   05/29/16 0542 05/29/16 1443 05/29/16 2215 05/30/16 0629  BP: 134/73 (!) 143/47 (!) 121/56 (!) 154/78  Pulse: 85 (!) 54 77 91  Resp: 14 16 16 18   Temp: 97.6 F (36.4 C) (!) 94.1 F (34.5 C) 97.7 F (36.5 C) 97.5 F (36.4 C)  TempSrc: Axillary Axillary Axillary Axillary  SpO2: 97% (!) 81% 95% 96%  Weight:      Height:        Gen: Elderly thin built male, poorly communicative not in distress HEENT: moist  mucosa,, supple neck Chest: clear b/l, no added sounds CVS: N S1&S2, no murmurs GI: soft, NT, ND, BS+, reducible  inguinal hernia, Musculoskeletal: warm, no edema     The results of significant diagnostics from this hospitalization (including imaging, microbiology, ancillary and laboratory) are listed below for reference.     Microbiology: Recent Results (from the past 240 hour(s))  Urine culture     Status: None   Collection Time: 05/28/16  3:30 PM  Result Value Ref Range Status   Specimen Description URINE, RANDOM  Final   Special Requests NONE  Final   Culture NO GROWTH [facility]formed [facility]  Final   Report Status 05/30/2016 FINAL  Final     Labs: BNP (last 3 results) No results for input(s): BNP in the last 8760 hours. Basic Metabolic Panel:  Recent Labs Lab 05/28/16 1526 05/29/16 0353  NA 141 142  K 4.0 3.8  CL 99* 103  CO2 32 32  GLUCOSE 105* 104*  BUN 29* 25*  CREATININE 1.38* 1.07  CALCIUM 9.4 8.8*   Liver Function Tests:  Recent Labs Lab 05/28/16 1526  AST 54*  ALT 22  ALKPHOS 65  BILITOT 0.7  PROT 7.4  ALBUMIN 3.7    Recent Labs Lab 05/28/16 1526  LIPASE  25   No results for input(s): AMMONIA in the last 168 hours. CBC:  Recent Labs Lab 05/28/16 1526 05/29/16 0353  WBC 7.6 5.6  NEUTROABS 4.9  --   HGB 11.5* 10.7*  HCT 36.9* 33.6*  MCV 101.7* 100.9*  PLT 406* 325   Cardiac Enzymes: No results for input(s): CKTOTAL, CKMB, CKMBINDEX, TROPONINI in the last 168 hours. BNP: Invalid input(s): POCBNP CBG:  Recent Labs Lab 05/29/16 0725 05/30/16 0757 05/30/16 0842  GLUCAP 91 67 114*   D-Dimer No results for input(s): DDIMER in the last 72 hours. Hgb A1c No results for input(s): HGBA1C in the last 72 hours. Lipid Profile No results for input(s): CHOL, HDL, LDLCALC, TRIG, CHOLHDL, LDLDIRECT in the last 72 hours. Thyroid function studies No results for input(s): TSH, T4TOTAL, T3FREE, THYROIDAB in the last 72 hours.  Invalid input(s): FREET3 Anemia work up No results for input(s): VITAMINB12, FOLATE, FERRITIN, TIBC, IRON, RETICCTPCT in the last 72 hours. Urinalysis    Component Value Date/Time   COLORURINE YELLOW 05/28/2016 1530   APPEARANCEUR CLEAR 05/28/2016 1530   LABSPEC 1.019 05/28/2016 1530   PHURINE 5.0 05/28/2016 1530   GLUCOSEU NEGATIVE 05/28/2016 1530   HGBUR NEGATIVE 05/28/2016 1530   BILIRUBINUR NEGATIVE 05/28/2016 1530   KETONESUR 20 (A) 05/28/2016 1530   PROTEINUR NEGATIVE 05/28/2016 1530   UROBILINOGEN 0.2 03/28/2015 2213   NITRITE NEGATIVE 05/28/2016 1530   LEUKOCYTESUR NEGATIVE 05/28/2016 1530   Sepsis Labs Invalid input(s): PROCALCITONIN,  WBC,  LACTICIDVEN Microbiology Recent Results (from the past 240 hour(s))  Urine culture     Status: None   Collection Time: 05/28/16  3:30 PM  Result Value Ref Range Status   Specimen Description URINE, RANDOM  Final   Special Requests NONE  Final   Culture NO GROWTH Performed at [facility]   Final   Report Status 05/30/2016 FINAL  Final     Time coordinating discharge: <30 minutes  SIGNED:   Eddie North, MD  [facility] 05/30/2016, 10:40 AM Pager   If 7PM-7AM, please contact night-coverage [email or web address] Password [email or web address]

## 2016-05-30 NOTE — Progress Notes (Signed)
Attempted to call for report x 2. No answers. Will call back later.

## 2016-06-16 DEATH — deceased

## 2017-01-01 IMAGING — CR DG KNEE COMPLETE 4+V*R*
4 series · 4 of 4 positions shown · non-contrast
Comparison: Right femur radiographs - earlier same day

CLINICAL DATA: Unwitnessed fall at home now with right knee and
thigh pain.

EXAM:
RIGHT KNEE - COMPLETE 4+ VIEW

[t knee lat right]
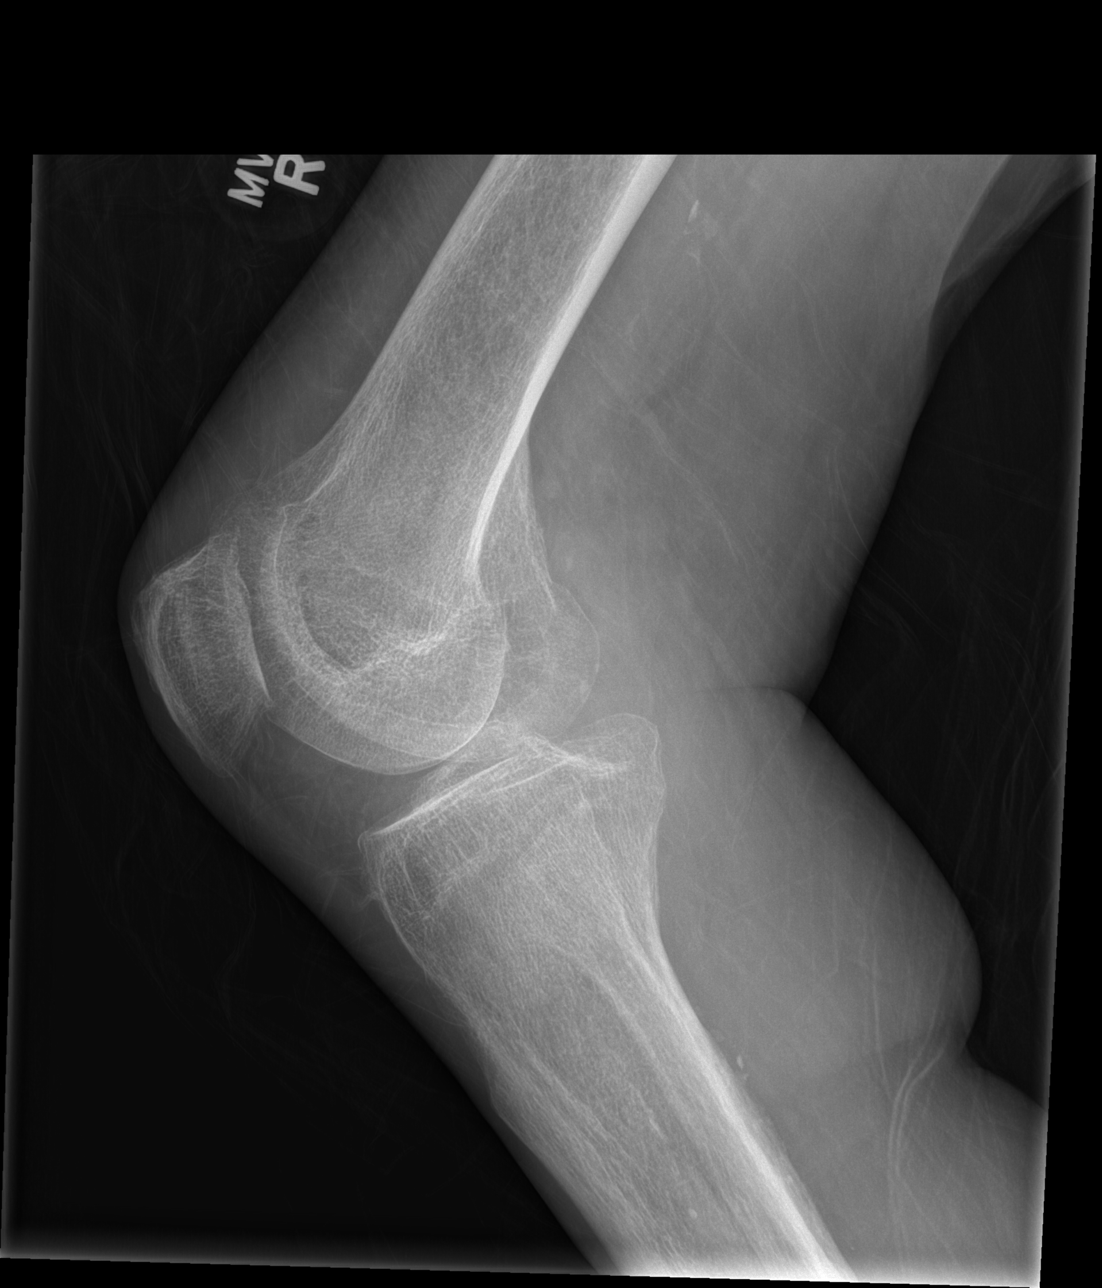

[t knee ap right]
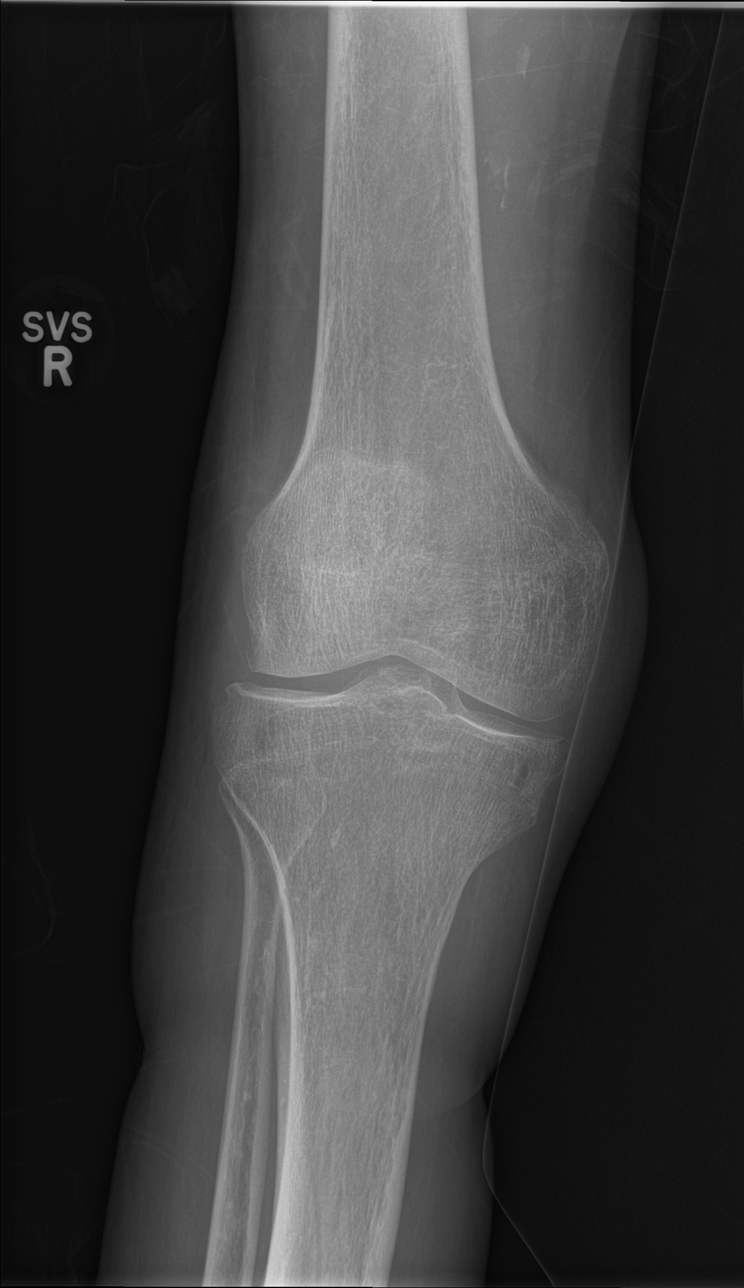

[x knee ap right (1 of 2)]
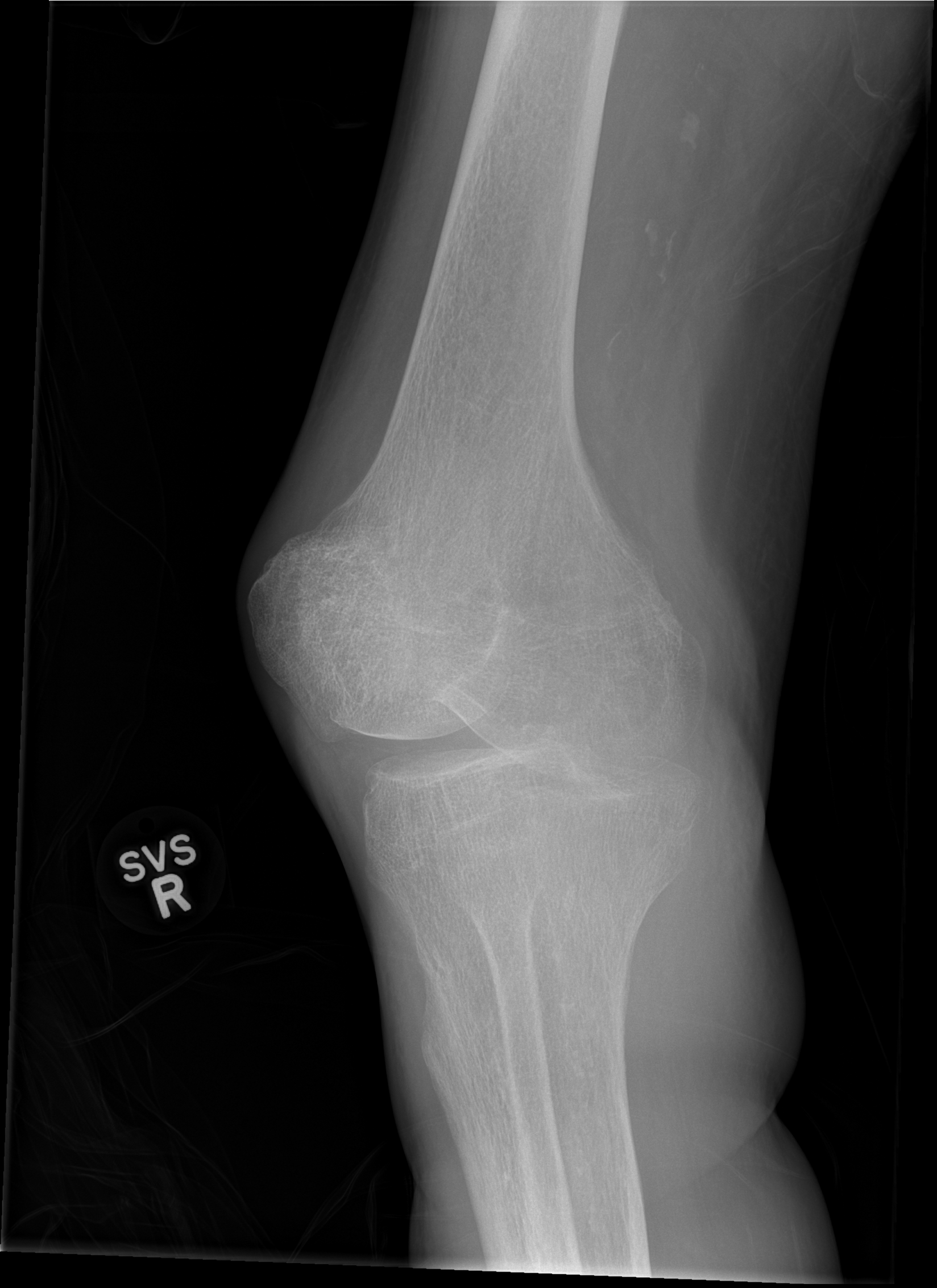

[x knee ap right (2 of 2)]
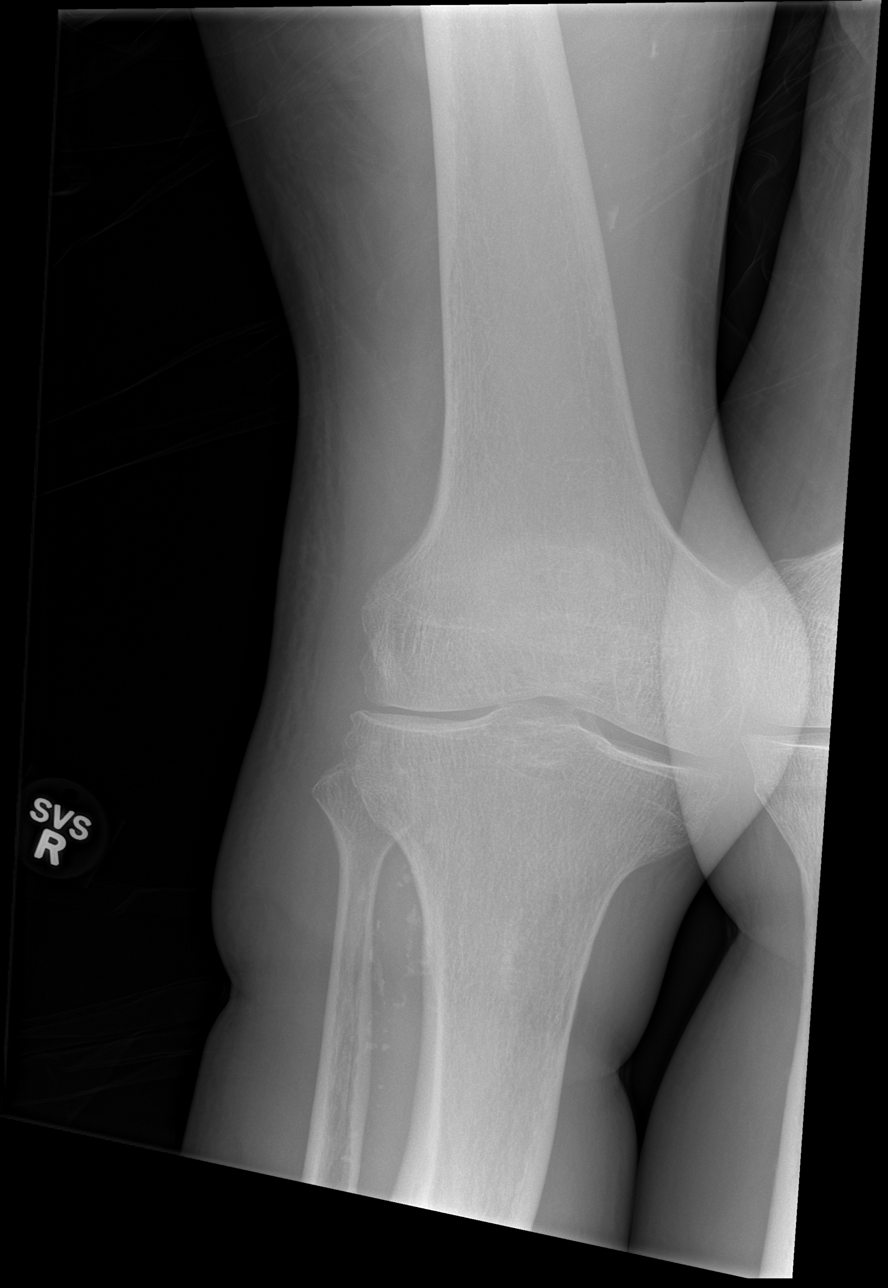

[4 of 4 positions shown; findings below may reference images not displayed]

FINDINGS: The lateral radiograph is degraded due to obliquity.

Osteopenia without definite displaced fracture. Mild degenerative
change of the knee, most conspicuous within the medial compartment
with joint space loss, subchondral sclerosis and osteophytosis. No
evidence of chondrocalcinosis. No joint effusion or evidence of
lipohemarthrosis. Adjacent vascular calcifications. No radiopaque
foreign body.
IMPRESSION: Osteopenia without fracture or dislocation.

## 2017-01-01 IMAGING — CR DG PELVIS 1-2V
1 series · 1 of 1 positions shown · non-contrast
Comparison: Abdominal radiographs performed 11/06/2014 and
12/23/2011

CLINICAL DATA: Status post fall. Concern for pelvic injury. Right
thigh pain. Initial encounter.

EXAM:
PELVIS - 1-2 VIEW

[t pelvis ap]
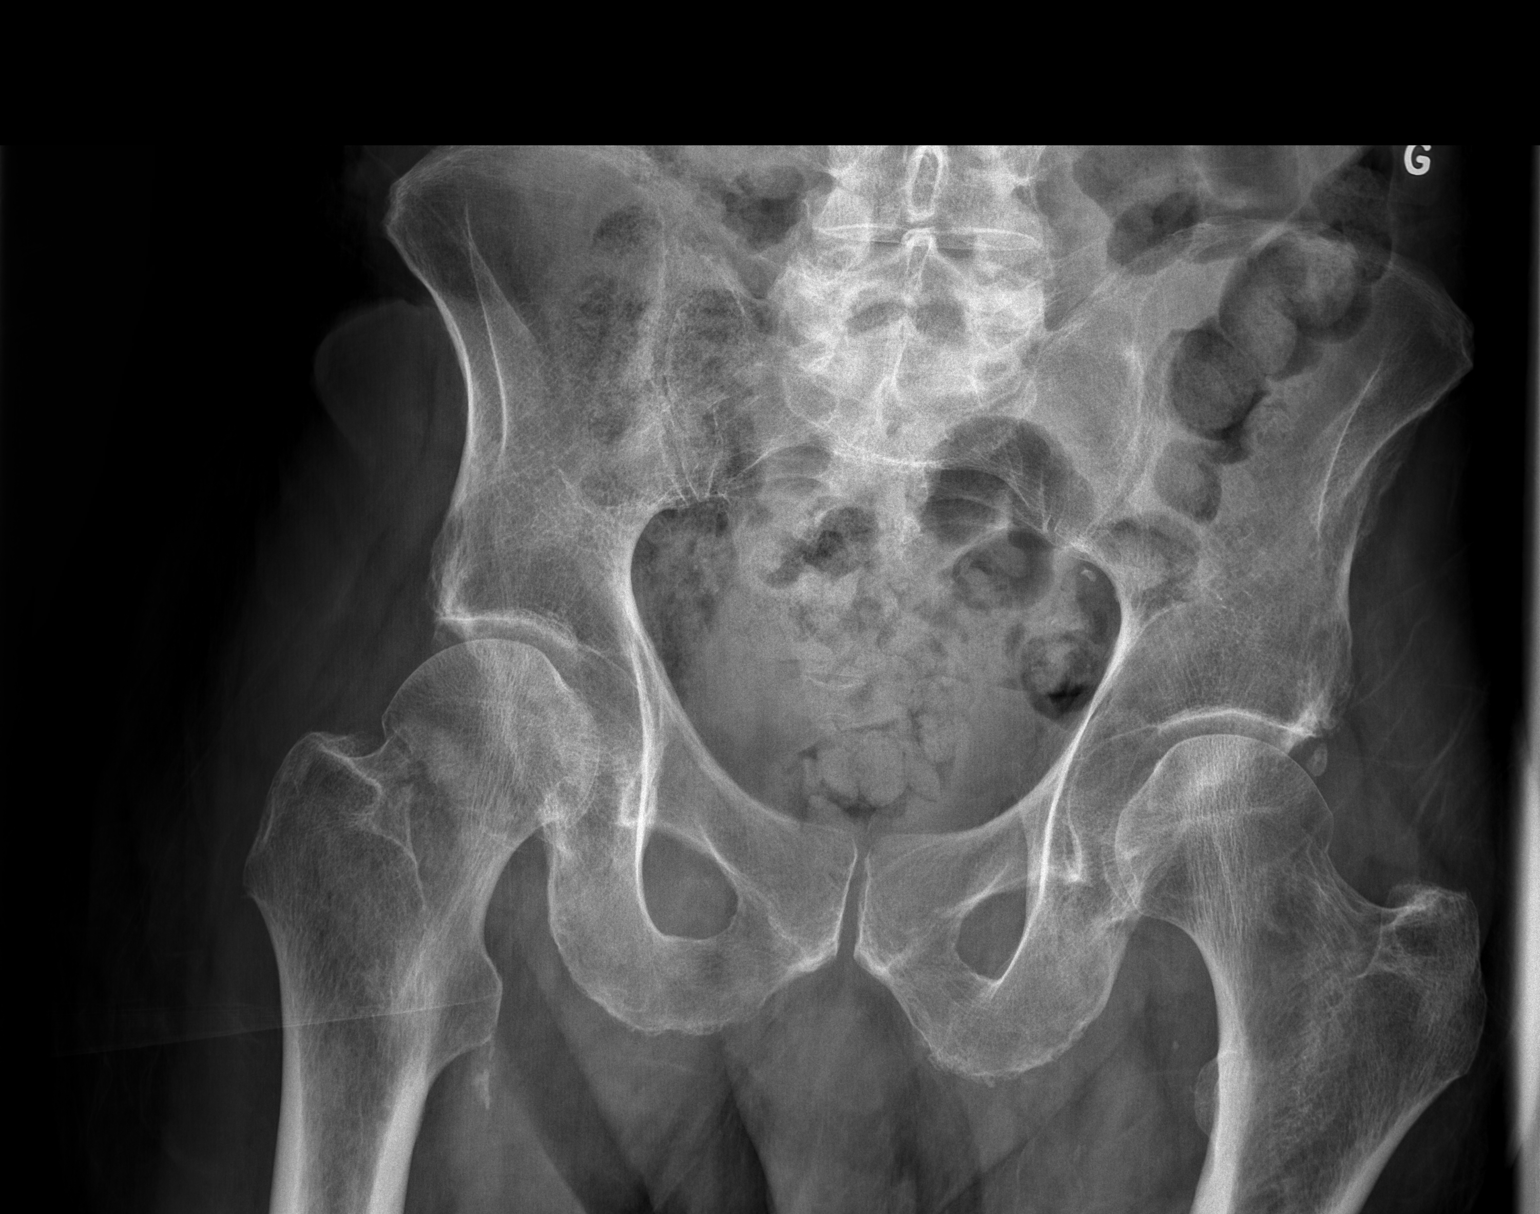

[1 of 1 positions shown; findings below may reference images not displayed]

FINDINGS: There is new cortical irregularity at the right femoral neck,
raising suspicion for a subcapital fracture of the right femoral
neck. The right femoral head remains seated at the acetabulum.

Mild sclerotic change is noted at the sacroiliac joints. The left
hip joint is unremarkable in appearance, with a small osseous
density lateral to the joint space likely reflecting a small loose
body. The visualized bowel gas pattern is grossly unremarkable.
IMPRESSION: New cortical irregularity of the right femoral neck, raising
suspicion for a subcapital fracture of the right femoral neck.

## 2017-01-01 IMAGING — CR DG FEMUR 2+V*R*
4 series · 4 of 4 positions shown · non-contrast
Comparison: None.

CLINICAL DATA: Fall

EXAM:
RIGHT FEMUR 2 VIEWS

[t femur proximal lat right (1 of 2)]
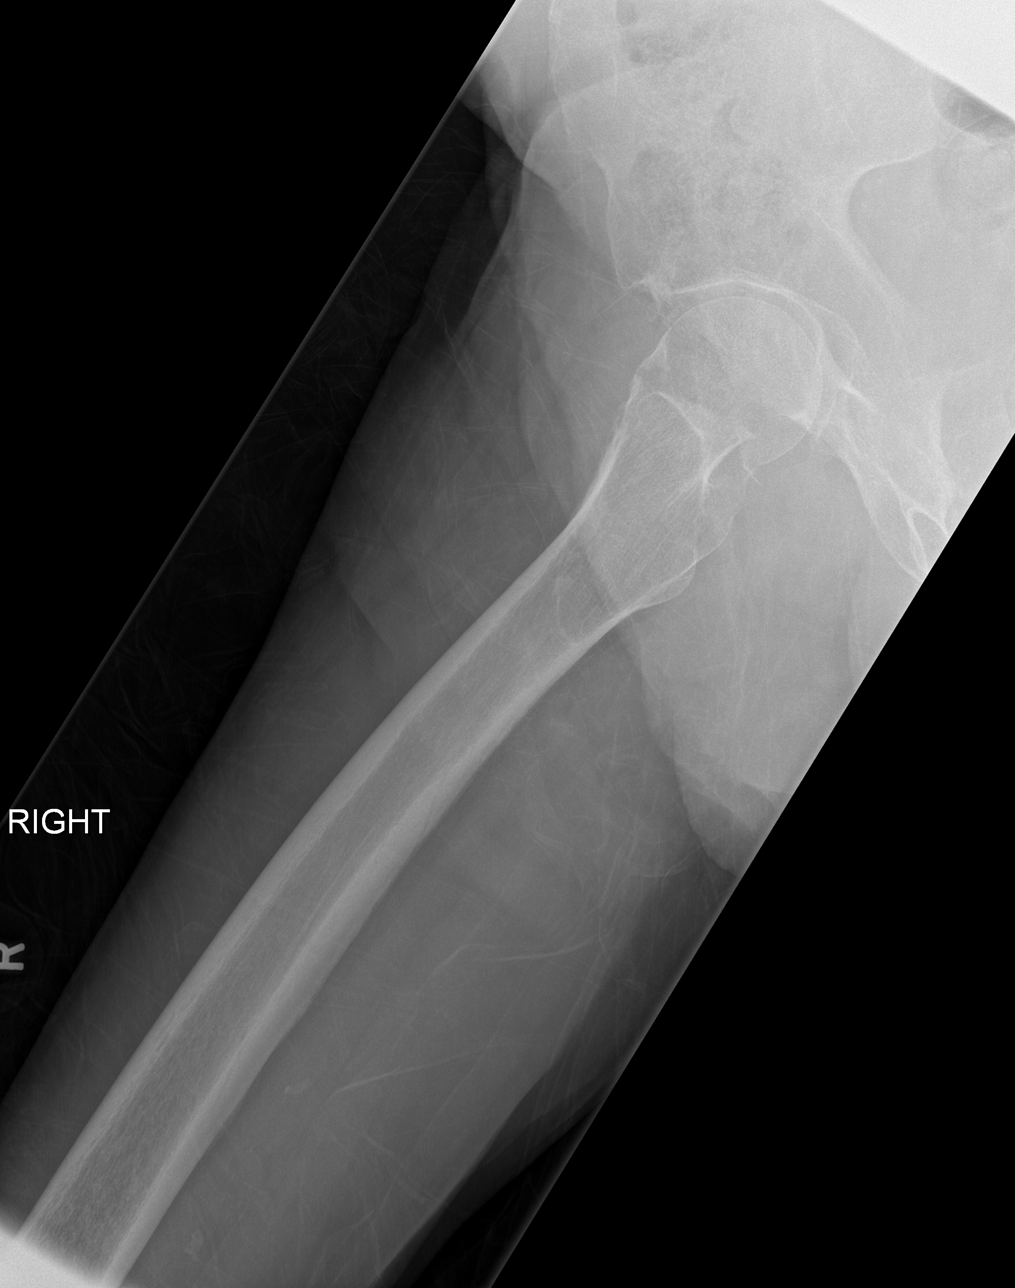

[t femur distal lat right]
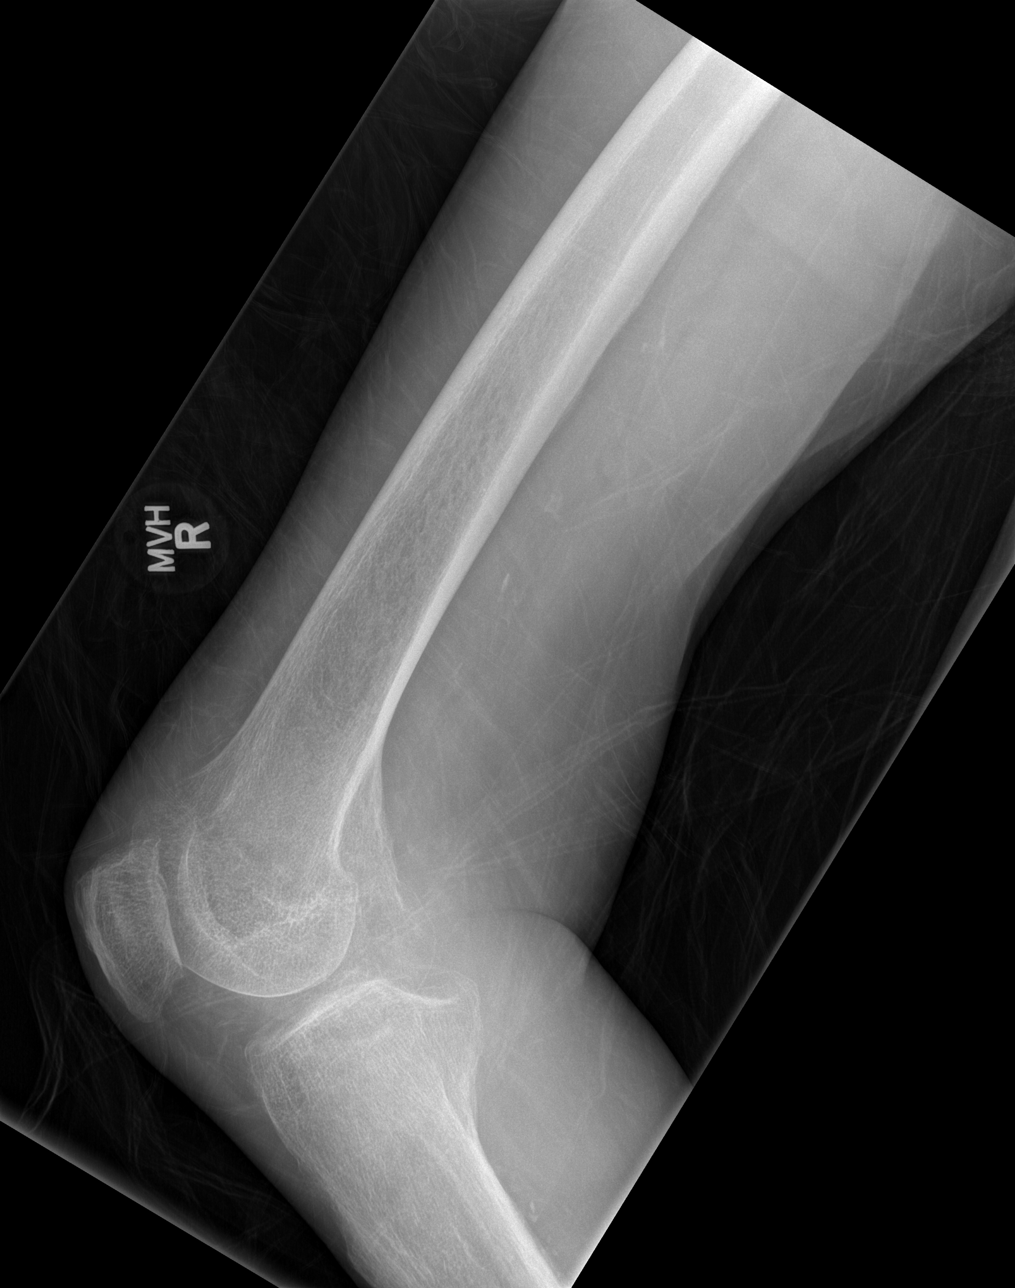

[t femur proximal ap right]
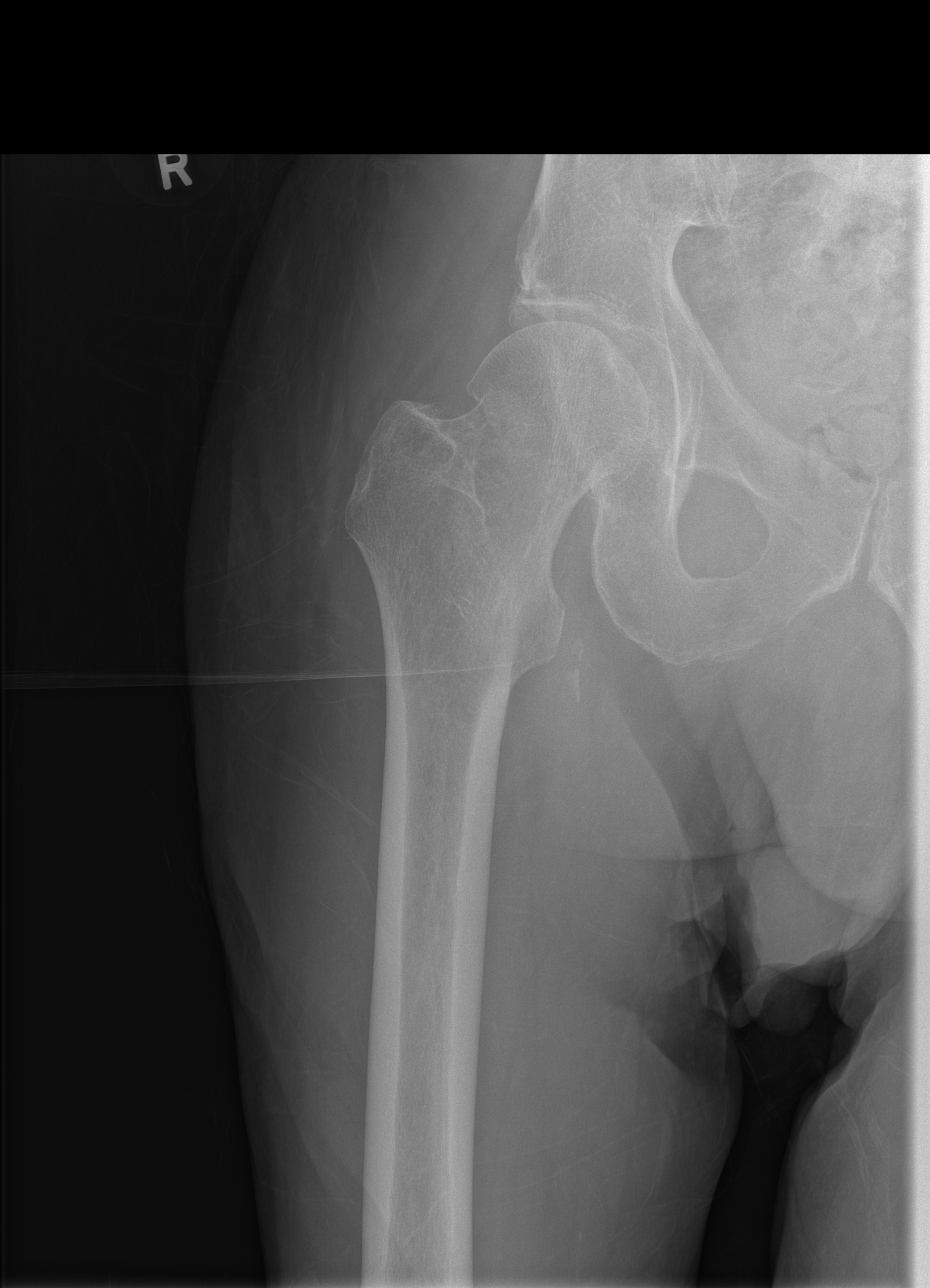

[t femur proximal lat right (2 of 2)]
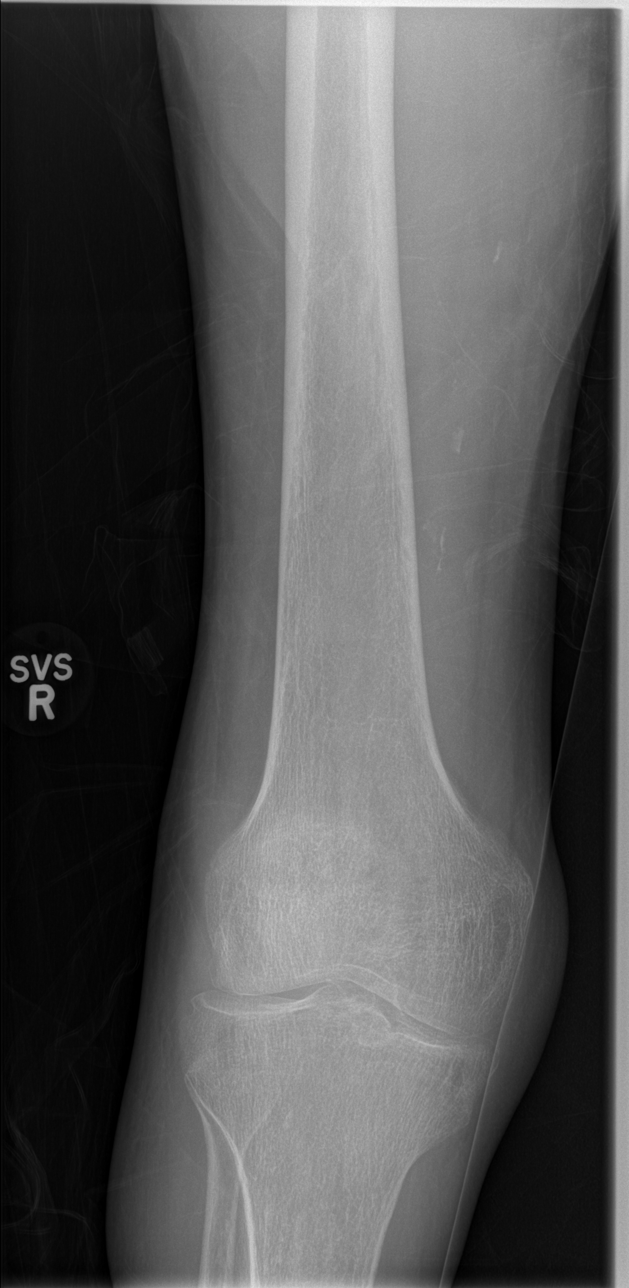

[4 of 4 positions shown; findings below may reference images not displayed]

FINDINGS: Subcapital femoral neck fracture with impaction is present.
Osteopenia. Vascular calcifications are noted. No dislocation.
IMPRESSION: Acute subcapital right femoral neck fracture with impaction.
Osteopenia.
